# Patient Record
Sex: Female | Born: 1955 | Race: White | Hispanic: No | Smoking: Current some day smoker
Health system: Southern US, Community
[De-identification: ages and names within clinical notes are randomized; demographics above are authoritative.]

## PROBLEM LIST (undated history)

## (undated) DIAGNOSIS — M069 Rheumatoid arthritis, unspecified: Secondary | ICD-10-CM

## (undated) DIAGNOSIS — N83209 Unspecified ovarian cyst, unspecified side: Secondary | ICD-10-CM

## (undated) DIAGNOSIS — F32A Depression, unspecified: Secondary | ICD-10-CM

## (undated) DIAGNOSIS — R51 Headache: Secondary | ICD-10-CM

## (undated) DIAGNOSIS — R519 Headache, unspecified: Secondary | ICD-10-CM

## (undated) DIAGNOSIS — T8859XA Other complications of anesthesia, initial encounter: Secondary | ICD-10-CM

## (undated) DIAGNOSIS — E785 Hyperlipidemia, unspecified: Secondary | ICD-10-CM

## (undated) DIAGNOSIS — J189 Pneumonia, unspecified organism: Secondary | ICD-10-CM

## (undated) DIAGNOSIS — Z87442 Personal history of urinary calculi: Secondary | ICD-10-CM

## (undated) DIAGNOSIS — F419 Anxiety disorder, unspecified: Secondary | ICD-10-CM

## (undated) DIAGNOSIS — F329 Major depressive disorder, single episode, unspecified: Secondary | ICD-10-CM

## (undated) DIAGNOSIS — I10 Essential (primary) hypertension: Secondary | ICD-10-CM

## (undated) HISTORY — DX: Depression, unspecified: F32.A

## (undated) HISTORY — DX: Unspecified ovarian cyst, unspecified side: N83.209

## (undated) HISTORY — DX: Rheumatoid arthritis, unspecified: M06.9

## (undated) HISTORY — PX: KNEE ARTHROSCOPY: SUR90

## (undated) HISTORY — DX: Headache: R51

## (undated) HISTORY — DX: Headache, unspecified: R51.9

## (undated) HISTORY — DX: Anxiety disorder, unspecified: F41.9

## (undated) HISTORY — PX: BREAST EXCISIONAL BIOPSY: SUR124

## (undated) HISTORY — PX: OVARIAN CYST REMOVAL: SHX89

## (undated) HISTORY — PX: COLONOSCOPY W/ POLYPECTOMY: SHX1380

## (undated) HISTORY — PX: JOINT REPLACEMENT: SHX530

## (undated) HISTORY — DX: Essential (primary) hypertension: I10

## (undated) HISTORY — DX: Hyperlipidemia, unspecified: E78.5

## (undated) HISTORY — DX: Major depressive disorder, single episode, unspecified: F32.9

---

## 1982-06-23 HISTORY — PX: TONSILECTOMY/ADENOIDECTOMY WITH MYRINGOTOMY: SHX6125

## 1991-06-24 HISTORY — PX: ABDOMINAL HYSTERECTOMY: SHX81

## 1999-06-04 ENCOUNTER — Encounter: Admission: RE | Admit: 1999-06-04 | Discharge: 1999-06-04 | Payer: Self-pay | Admitting: Gastroenterology

## 1999-06-04 ENCOUNTER — Encounter: Payer: Self-pay | Admitting: Gastroenterology

## 1999-06-12 ENCOUNTER — Ambulatory Visit (HOSPITAL_COMMUNITY): Admission: RE | Admit: 1999-06-12 | Discharge: 1999-06-12 | Payer: Self-pay | Admitting: Gastroenterology

## 1999-06-12 ENCOUNTER — Encounter: Payer: Self-pay | Admitting: Gastroenterology

## 1999-06-24 HISTORY — PX: OTHER SURGICAL HISTORY: SHX169

## 2004-05-28 ENCOUNTER — Ambulatory Visit: Payer: Self-pay | Admitting: Internal Medicine

## 2004-09-02 ENCOUNTER — Ambulatory Visit: Payer: Self-pay | Admitting: Internal Medicine

## 2006-01-28 ENCOUNTER — Ambulatory Visit: Payer: Self-pay | Admitting: Internal Medicine

## 2006-06-18 ENCOUNTER — Ambulatory Visit: Payer: Self-pay | Admitting: Urology

## 2006-06-19 ENCOUNTER — Ambulatory Visit: Payer: Self-pay | Admitting: Urology

## 2006-07-09 ENCOUNTER — Ambulatory Visit: Payer: Self-pay | Admitting: Urology

## 2006-07-29 ENCOUNTER — Ambulatory Visit: Payer: Self-pay | Admitting: Urology

## 2008-01-17 ENCOUNTER — Ambulatory Visit: Payer: Self-pay | Admitting: Urology

## 2008-01-26 ENCOUNTER — Ambulatory Visit: Payer: Self-pay | Admitting: Urology

## 2008-02-03 ENCOUNTER — Ambulatory Visit: Payer: Self-pay | Admitting: Urology

## 2008-02-14 ENCOUNTER — Ambulatory Visit: Payer: Self-pay | Admitting: Urology

## 2008-04-10 ENCOUNTER — Ambulatory Visit: Payer: Self-pay | Admitting: Gastroenterology

## 2008-04-27 IMAGING — CT CT ABD-PELV W/O CM
1 of 2 series · 15 of 32 positions shown, 19 images · non-contrast
Comparison: none

REASON FOR EXAM: Renal colic    hematuria    nephroithasis
COMMENTS:

[Series 2: soft tissue · axial · 0.68mm/px · z∈[-236,+160]mm · 15 of 144 slices shown, 19 images]
[im 6/144  soft-tissue]
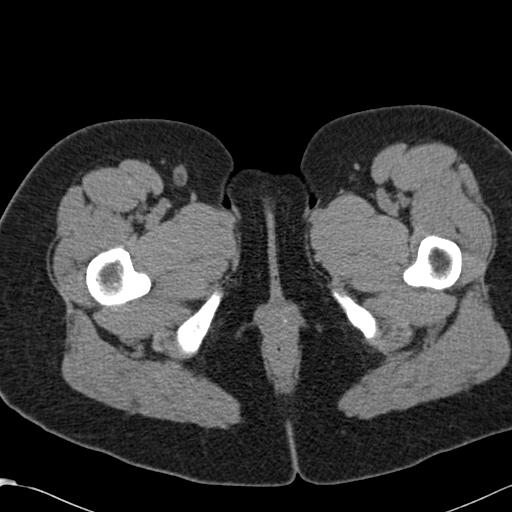
[im 6/144  bone]
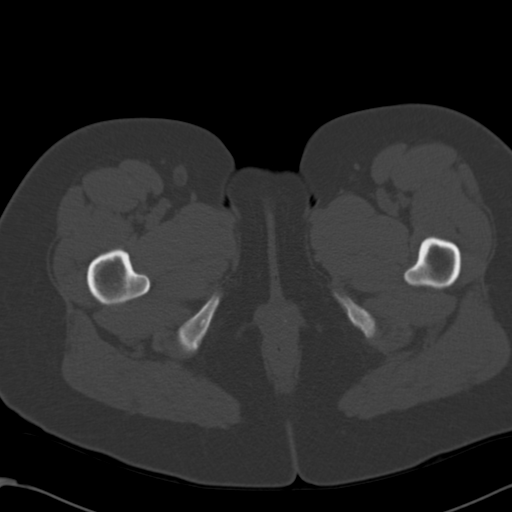
[im 17/144  soft-tissue]
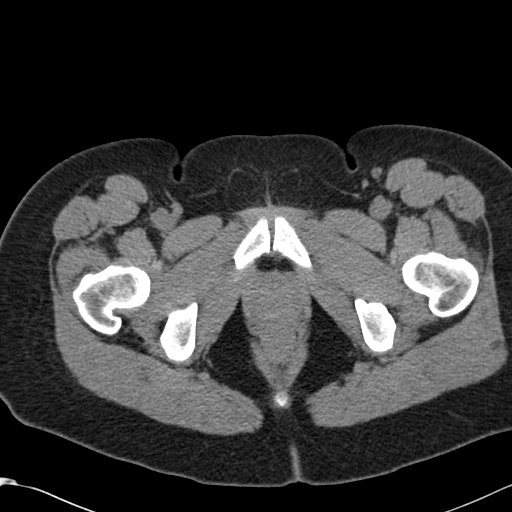
[im 28/144  soft-tissue]
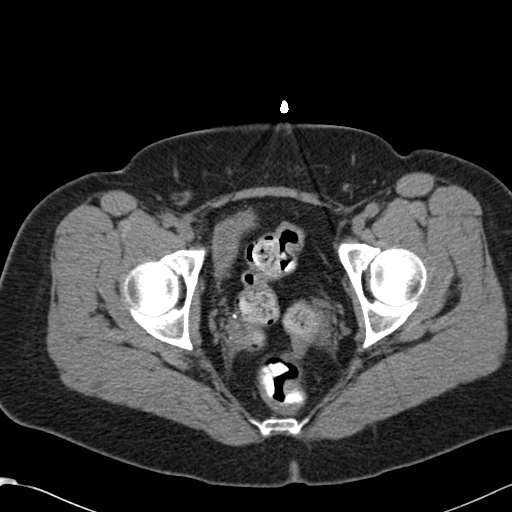
[im 39/144  soft-tissue]
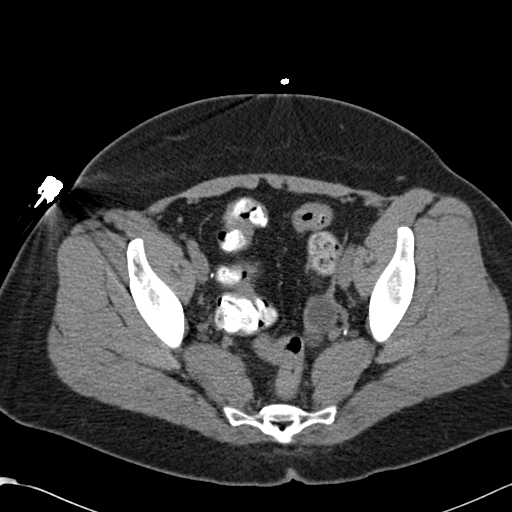
[im 50/144  soft-tissue]
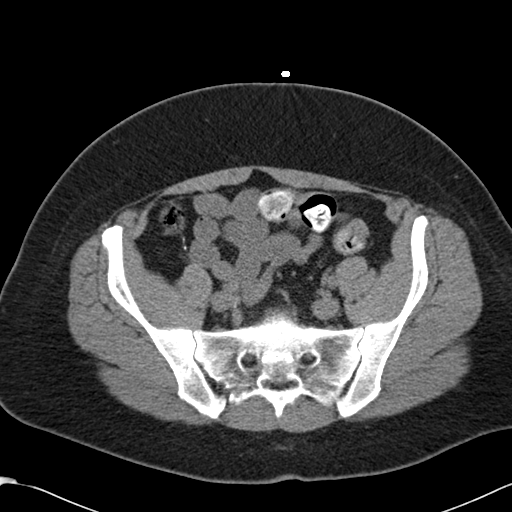
[im 61/144  soft-tissue]
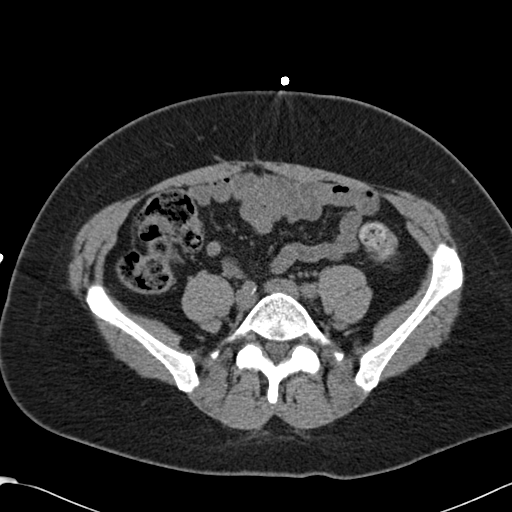
[im 72/144  soft-tissue]
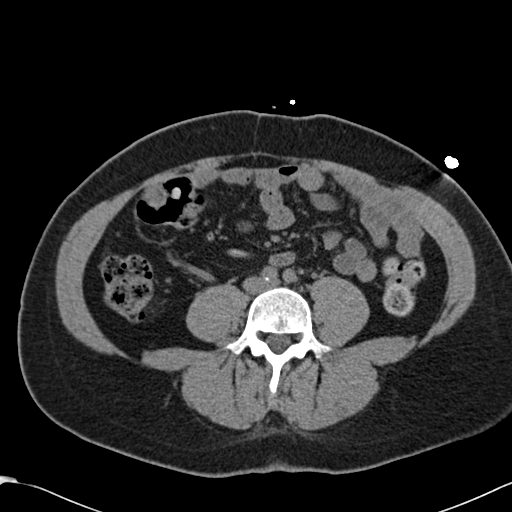
[im 83/144  soft-tissue]
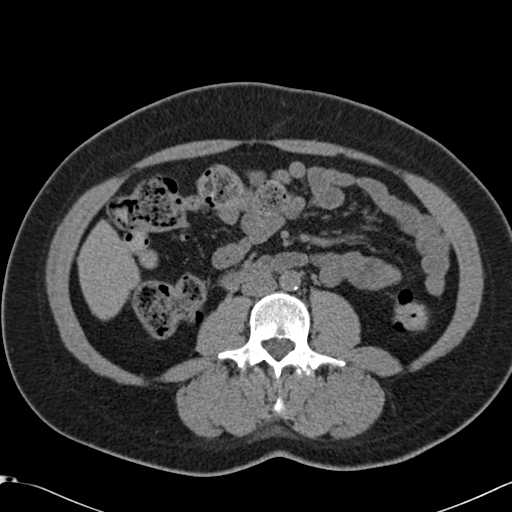
[im 94/144  soft-tissue]
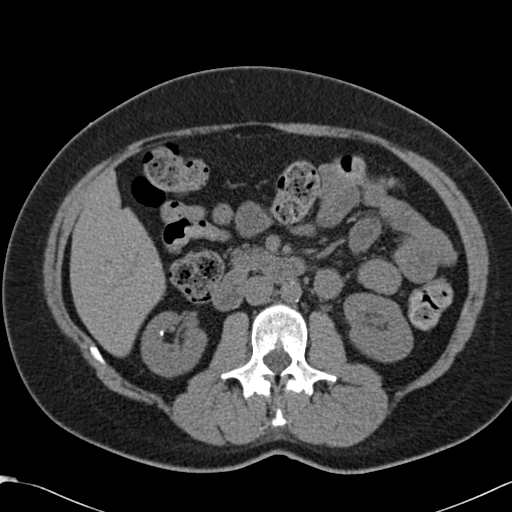
[im 94/144  bone]
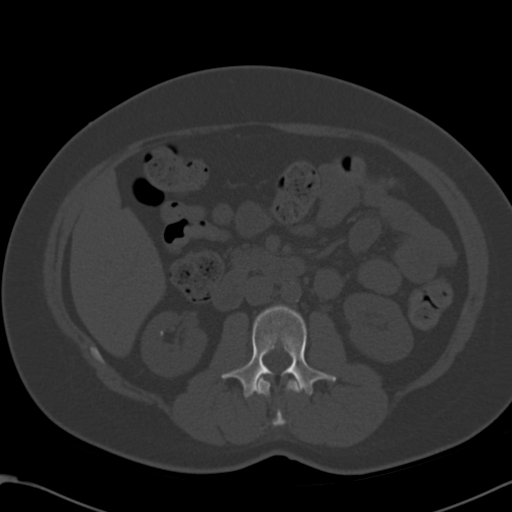
[im 105/144  soft-tissue]
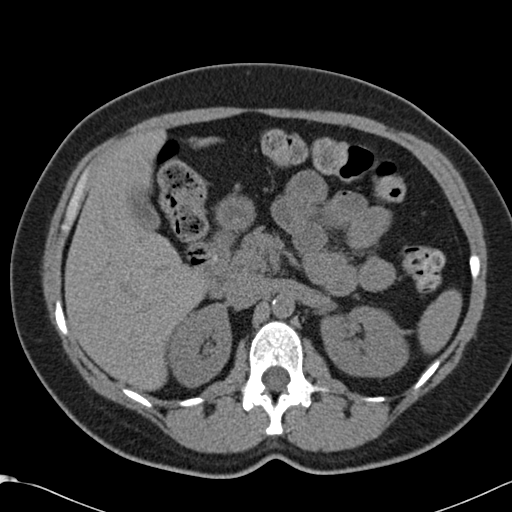
[im 116/144  soft-tissue]
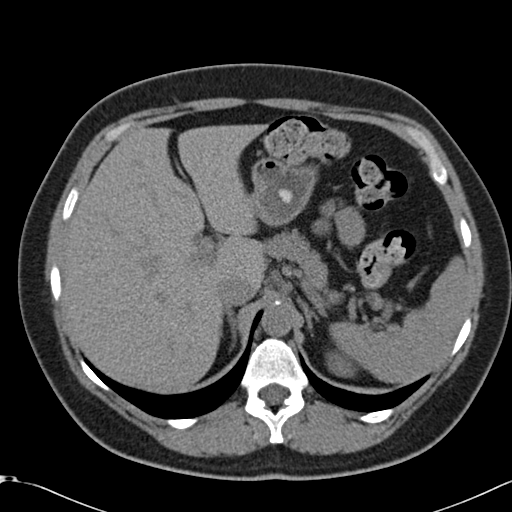
[im 122/144  lung]
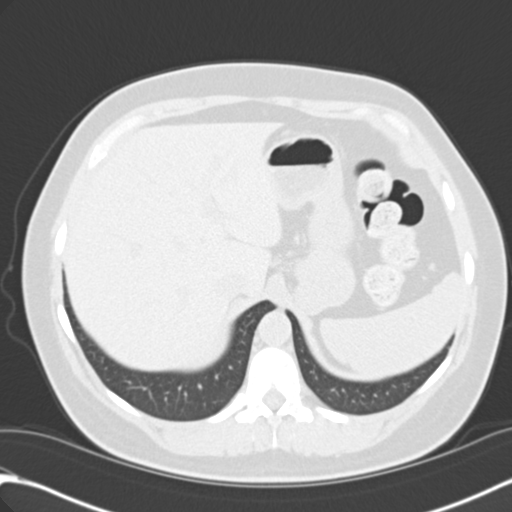
[im 127/144  soft-tissue]
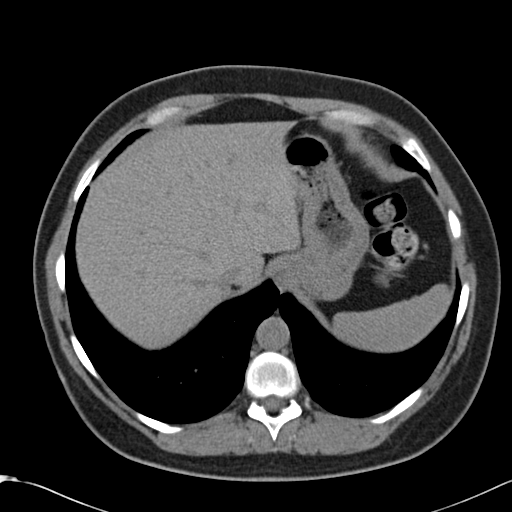
[im 127/144  lung]
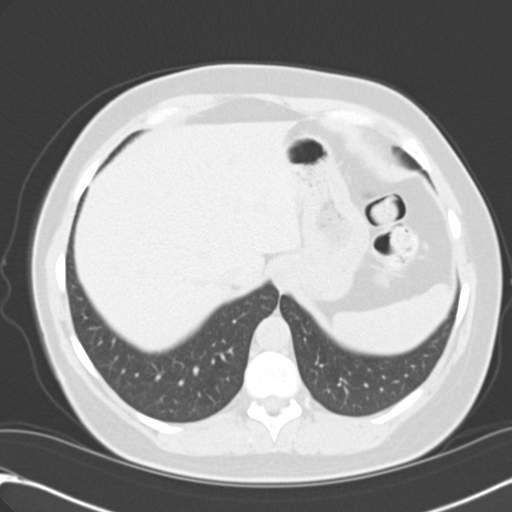
[im 133/144  lung]
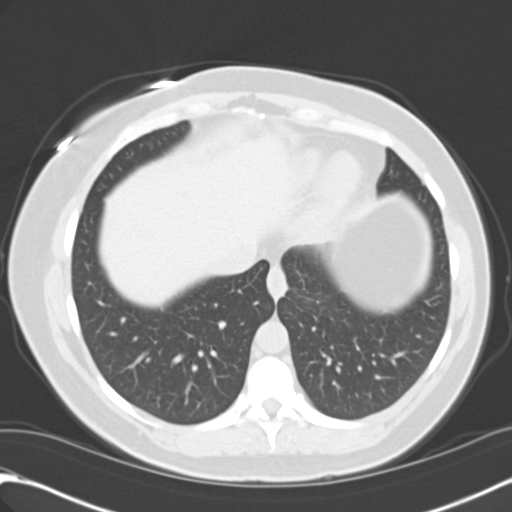
[im 138/144  soft-tissue]
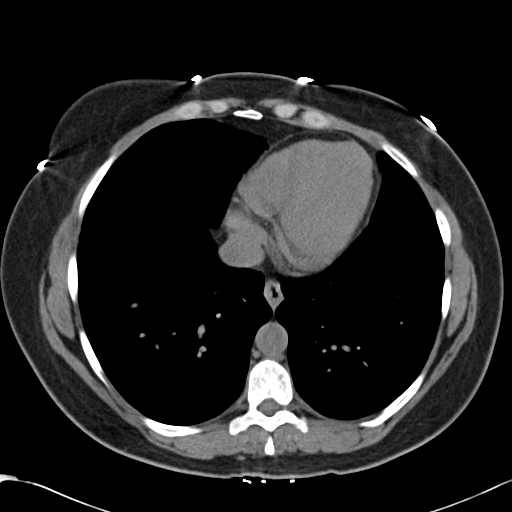
[im 138/144  lung]
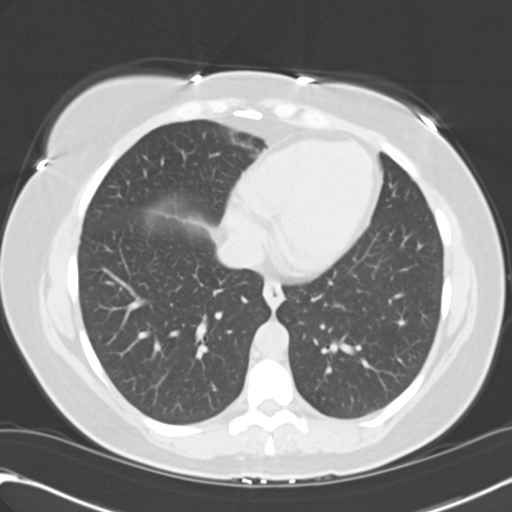

[15 of 32 positions shown; findings below may reference images not displayed]

PROCEDURE:     CT  - CT ABDOMEN AND PELVIS W[DATE]  [DATE]

RESULT:       Noncontrast CT scan of the abdomen and pelvis is performed.
The study is obtained utilizing a multislice helical acquisition and a renal
stone protocol.  There is a tiny calyceal stone on image 52 in the lower
pole of the RIGHT kidney.  This measures approximately 3.0-3.5 mm.  A second
area of density is seen in the LEFT renal pelvis region and measures up to
approximately 6.7 mm.  No additional stones are evident.  No renal cortical
defect or mass is seen.  No radiopaque gallstones are evident.  There appear
to be some radiopaque tablets in the patient's stomach.  There is no
abnormal bowel distention.  The pancreas, spleen and adrenal glands are
unremarkable. Atherosclerotic calcification is seen in a nonaneurysmal
aorta.  No inflammatory changes or areas of adenopathy are seen.  The
patient appears to be status post hysterectomy.
IMPRESSION: Bilateral renal stones.  The smaller appears to be in the RIGHT kidney lower
pole calyceal region and is nonobstructing.  A second is larger and seems to
be in the area of the LEFT renal pelvis.  No definite hydronephrosis is
seen.  Continued follow up is suggested.

## 2009-12-04 IMAGING — CT CT ABD-PELV W/O CM
1 of 2 series · 15 of 32 positions shown, 19 images · non-contrast
Comparison: none

REASON FOR EXAM: nephrolithiasis renal colic
COMMENTS:

PROCEDURE:     CT  - CT ABDOMEN AND PELVIS W[DATE]  [DATE]
RESULT:     Indication: Renal stone.
TECHNIQUE: A standard renal stone protocol was performed with sequential 5
mm noncontrasted axial images from the lung bases to the symphysis pubis
with the patient in a supine position.

[Series 2: stone · axial · 0.65mm/px · z∈[-738,-354]mm · 15 of 140 slices shown, 19 images]
[im 6/140  soft-tissue]
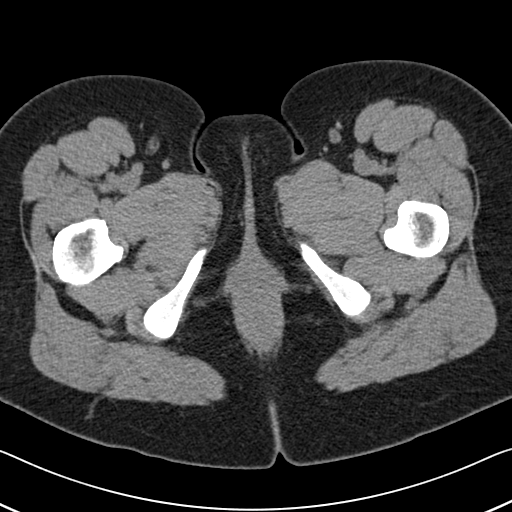
[im 6/140  bone]
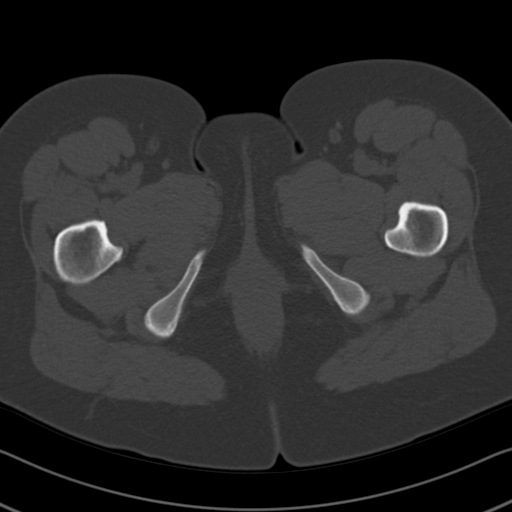
[im 17/140  soft-tissue]
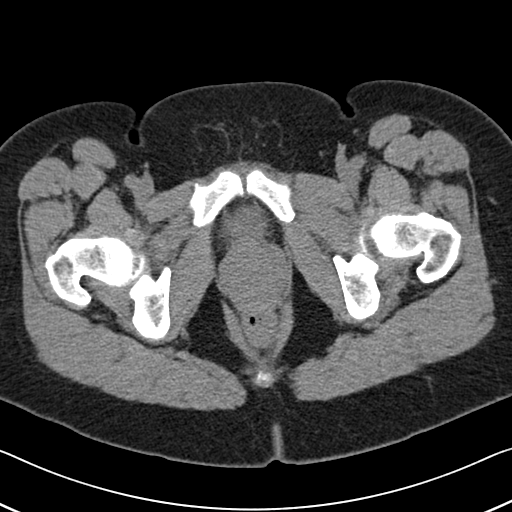
[im 27/140  soft-tissue]
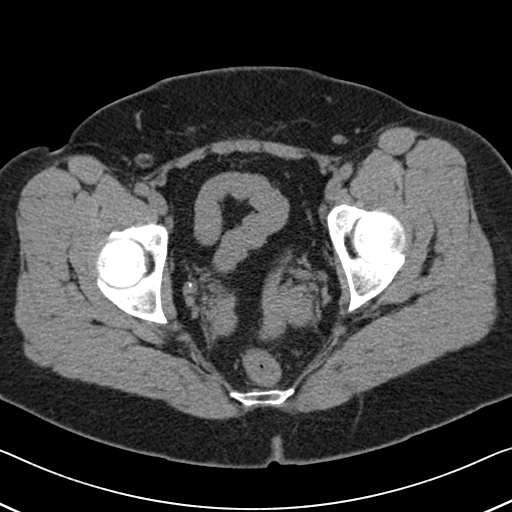
[im 38/140  soft-tissue]
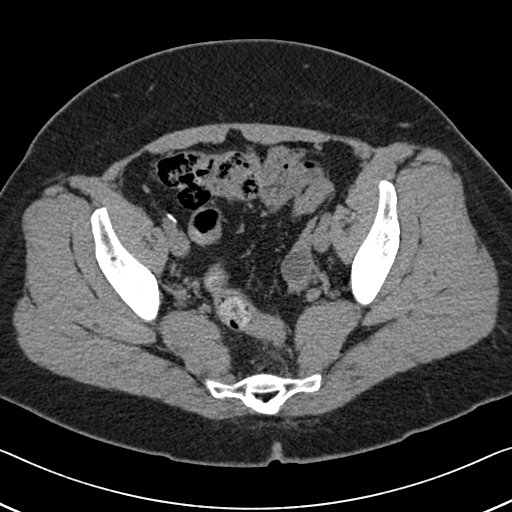
[im 49/140  soft-tissue]
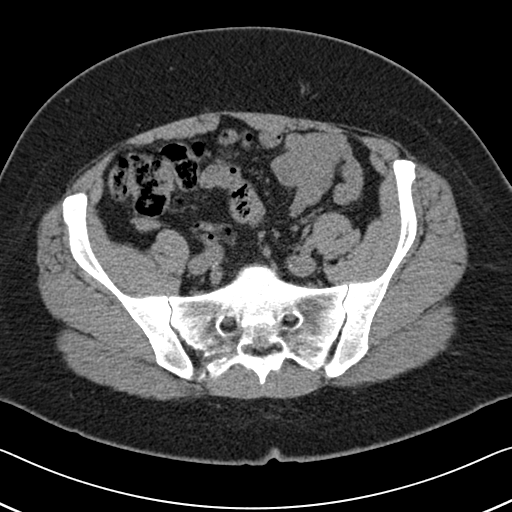
[im 59/140  soft-tissue]
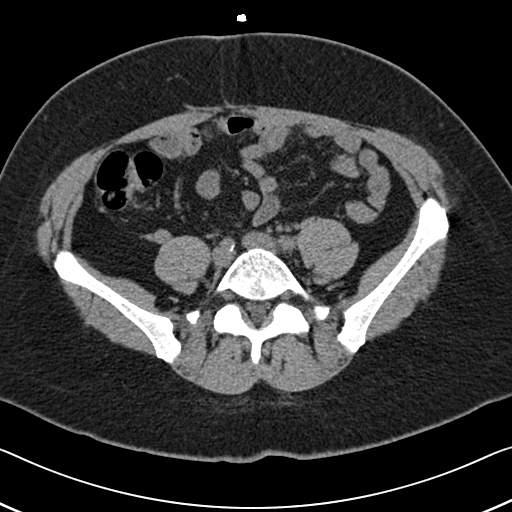
[im 70/140  soft-tissue]
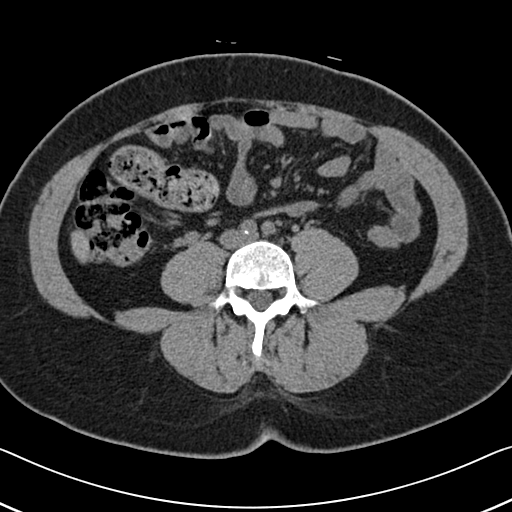
[im 81/140  soft-tissue]
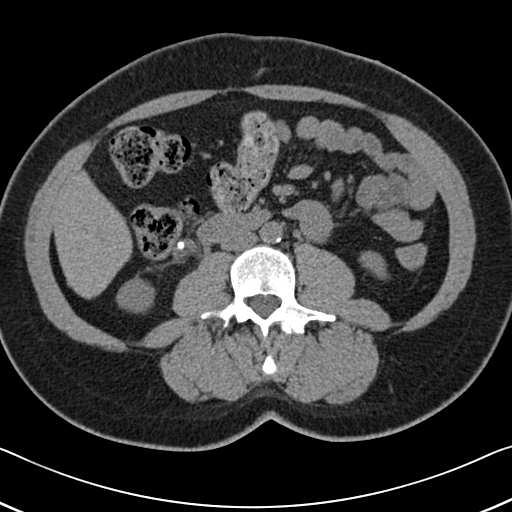
[im 91/140  soft-tissue]
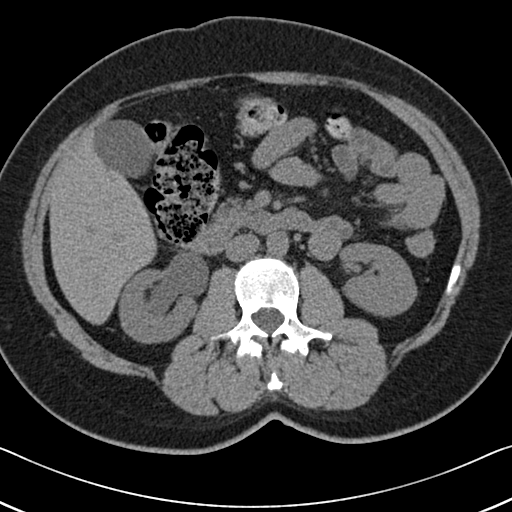
[im 91/140  bone]
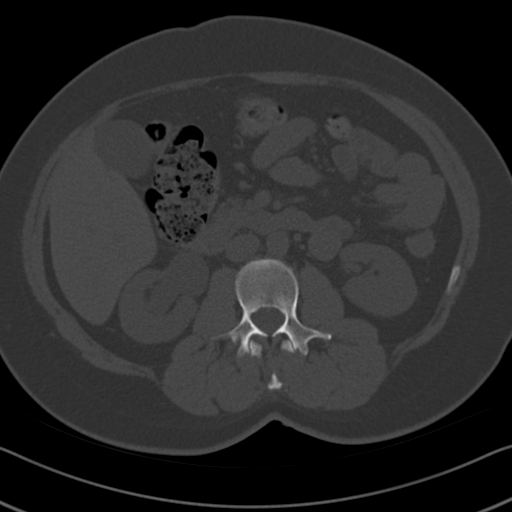
[im 102/140  soft-tissue]
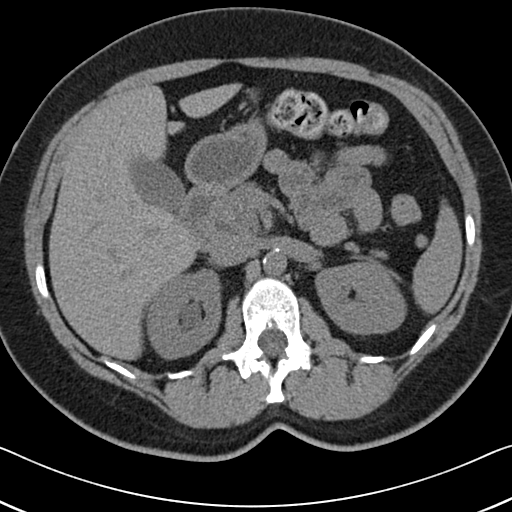
[im 113/140  soft-tissue]
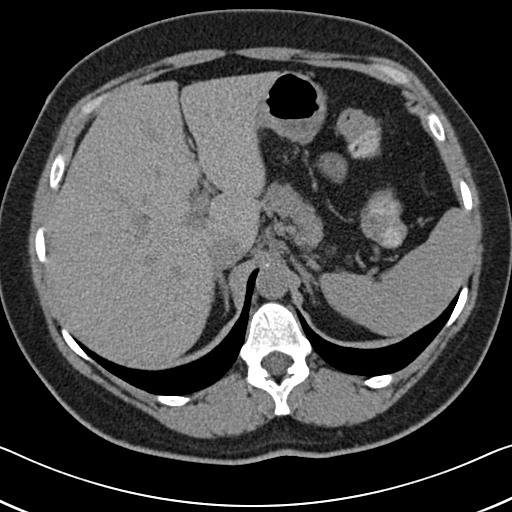
[im 118/140  lung]
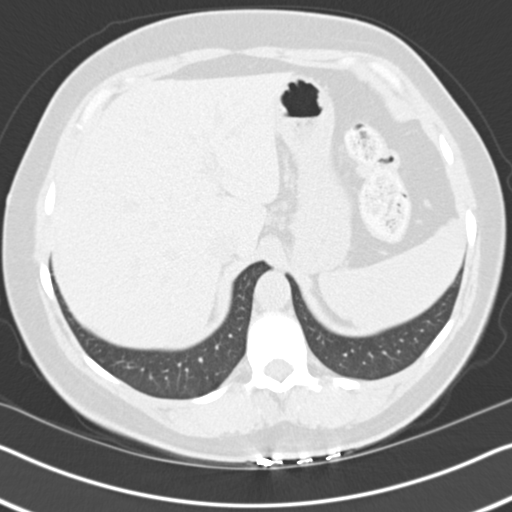
[im 123/140  soft-tissue]
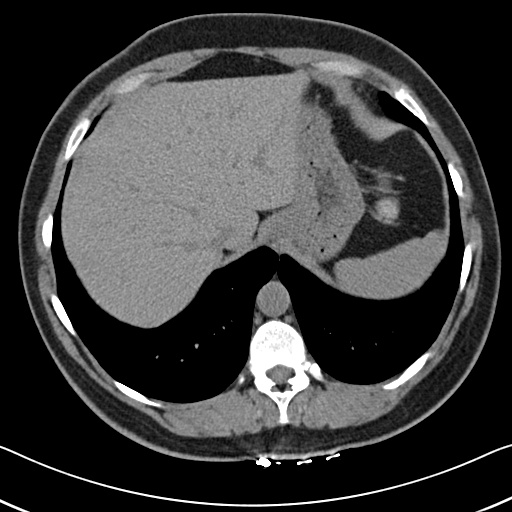
[im 123/140  lung]
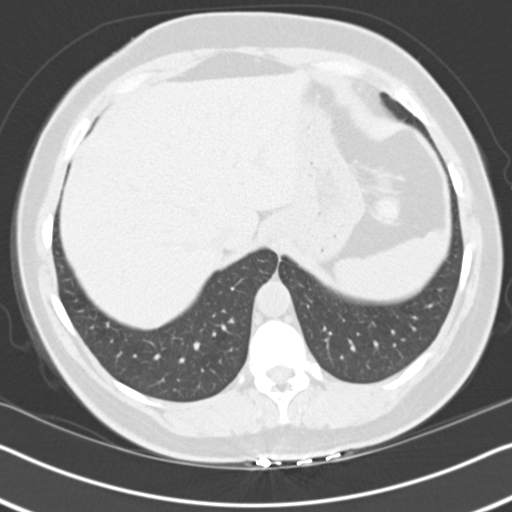
[im 129/140  lung]
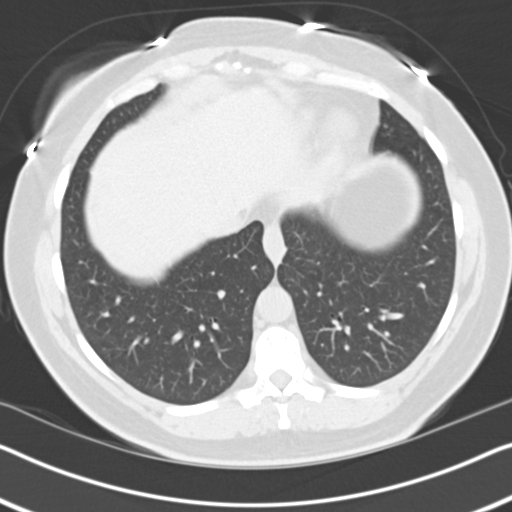
[im 134/140  soft-tissue]
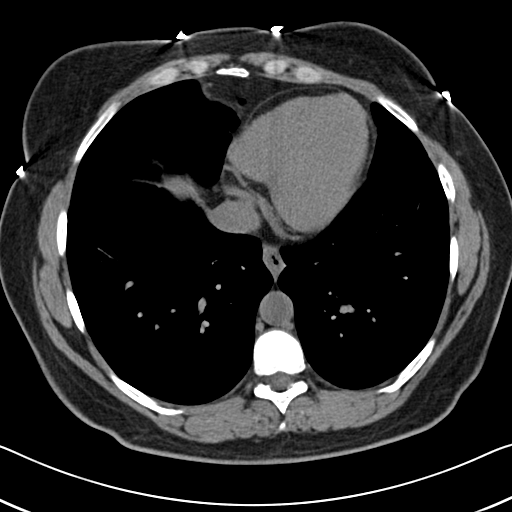
[im 134/140  lung]
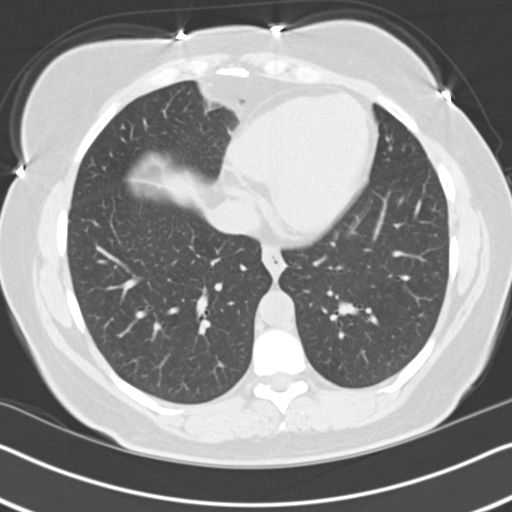

[15 of 32 positions shown; findings below may reference images not displayed]

FINDINGS: Limited views of the lung bases fail to demonstrate abnormal parenchymal
nodules or pleural or pericardial effusions.

There is a 10 mm proximal right ureteral calculus with moderate
hydronephrosis. There is no perinephric stranding or perinephric fluid
collection. There no other renal, ureteral, or bladder calculi.

The liver demonstrates no focal abnormality. The gallbladder is
unremarkable. The spleen demonstrates no focal abnormality. The adrenal
glands and pancreas are normal.

The unopacified stomach, duodenum, small intestine, and large intestine are
unremarkable, but evaluation is limited by lack of oral contrast. There is
no pneumoperitoneum, pneumatosis, or portal venous gas. There is no
abdominal or pelvic free fluid. There is no lymphadenopathy.

The aorta is normal in caliber. There is an abdominal aortic atherosclerotic
calcification.

The osseous structures are unremarkable.
IMPRESSION: 1. There is a 10 mm proximal right ureteral calculus with moderate right
hydronephrosis.

## 2012-03-26 ENCOUNTER — Ambulatory Visit: Payer: Self-pay | Admitting: Internal Medicine

## 2012-09-21 ENCOUNTER — Ambulatory Visit: Payer: Self-pay | Admitting: Internal Medicine

## 2013-01-07 ENCOUNTER — Ambulatory Visit: Payer: Self-pay | Admitting: Internal Medicine

## 2013-12-28 ENCOUNTER — Emergency Department: Payer: Self-pay | Admitting: Internal Medicine

## 2014-01-24 ENCOUNTER — Emergency Department: Payer: Self-pay | Admitting: Emergency Medicine

## 2014-01-24 LAB — CBC WITH DIFFERENTIAL/PLATELET
Basophil #: 0 10*3/uL (ref 0.0–0.1)
Basophil %: 0.3 %
Eosinophil #: 0.1 10*3/uL (ref 0.0–0.7)
Eosinophil %: 1.1 %
HCT: 40 % (ref 35.0–47.0)
HGB: 13.1 g/dL (ref 12.0–16.0)
Lymphocyte #: 1.3 10*3/uL (ref 1.0–3.6)
Lymphocyte %: 26 %
MCH: 28.4 pg (ref 26.0–34.0)
MCHC: 32.7 g/dL (ref 32.0–36.0)
MCV: 87 fL (ref 80–100)
Monocyte #: 0.4 x10 3/mm (ref 0.2–0.9)
Monocyte %: 7.1 %
Neutrophil #: 3.3 10*3/uL (ref 1.4–6.5)
Neutrophil %: 65.5 %
Platelet: 272 10*3/uL (ref 150–440)
RBC: 4.6 10*6/uL (ref 3.80–5.20)
RDW: 13 % (ref 11.5–14.5)
WBC: 5.1 10*3/uL (ref 3.6–11.0)

## 2014-01-24 LAB — URINALYSIS, COMPLETE
Bacteria: NONE SEEN
Bilirubin,UR: NEGATIVE
Blood: NEGATIVE
Glucose,UR: NEGATIVE mg/dL (ref 0–75)
Ketone: NEGATIVE
Leukocyte Esterase: NEGATIVE
Nitrite: NEGATIVE
Ph: 5 (ref 4.5–8.0)
Protein: NEGATIVE
RBC,UR: NONE SEEN /HPF (ref 0–5)
Specific Gravity: 1.014 (ref 1.003–1.030)
Squamous Epithelial: 3
WBC UR: NONE SEEN /HPF (ref 0–5)

## 2014-01-24 LAB — BASIC METABOLIC PANEL
Anion Gap: 8 (ref 7–16)
BUN: 13 mg/dL (ref 7–18)
Calcium, Total: 8.9 mg/dL (ref 8.5–10.1)
Chloride: 104 mmol/L (ref 98–107)
Co2: 26 mmol/L (ref 21–32)
Creatinine: 1.05 mg/dL (ref 0.60–1.30)
EGFR (African American): >60
EGFR (Non-African Amer.): 59 — ABNORMAL LOW
Glucose: 96 mg/dL (ref 65–99)
Osmolality: 276 (ref 275–301)
Potassium: 4 mmol/L (ref 3.5–5.1)
Sodium: 138 mmol/L (ref 136–145)

## 2014-01-24 LAB — HEPATIC FUNCTION PANEL A (ARMC)
Albumin: 4.2 g/dL (ref 3.4–5.0)
Alkaline Phosphatase: 74 U/L
Bilirubin, Direct: <0.1 mg/dL (ref 0.00–0.20)
Bilirubin,Total: 0.5 mg/dL (ref 0.2–1.0)
SGOT(AST): 22 U/L (ref 15–37)
SGPT (ALT): 30 U/L
Total Protein: 7.9 g/dL (ref 6.4–8.2)

## 2014-01-24 LAB — LIPASE, BLOOD: Lipase: 129 U/L (ref 73–393)

## 2014-03-14 DIAGNOSIS — G894 Chronic pain syndrome: Secondary | ICD-10-CM | POA: Insufficient documentation

## 2014-03-17 ENCOUNTER — Ambulatory Visit: Payer: Self-pay | Admitting: Internal Medicine

## 2014-03-23 ENCOUNTER — Ambulatory Visit: Payer: Self-pay | Admitting: Internal Medicine

## 2014-04-28 ENCOUNTER — Ambulatory Visit: Payer: Self-pay | Admitting: Internal Medicine

## 2014-05-23 ENCOUNTER — Ambulatory Visit: Payer: Self-pay | Admitting: Internal Medicine

## 2015-05-16 ENCOUNTER — Other Ambulatory Visit: Payer: Self-pay | Admitting: Internal Medicine

## 2015-05-16 DIAGNOSIS — Z1231 Encounter for screening mammogram for malignant neoplasm of breast: Secondary | ICD-10-CM

## 2015-05-29 ENCOUNTER — Ambulatory Visit: Payer: Self-pay | Attending: Internal Medicine

## 2016-09-15 ENCOUNTER — Other Ambulatory Visit: Payer: Self-pay | Admitting: Internal Medicine

## 2016-09-15 DIAGNOSIS — Z1231 Encounter for screening mammogram for malignant neoplasm of breast: Secondary | ICD-10-CM

## 2016-10-17 ENCOUNTER — Ambulatory Visit
Admission: RE | Admit: 2016-10-17 | Discharge: 2016-10-17 | Disposition: A | Discharge status: Home / Self Care | Payer: 59 | Source: Ambulatory Visit | Attending: Internal Medicine | Admitting: Internal Medicine

## 2016-10-17 ENCOUNTER — Encounter: Payer: Self-pay | Admitting: Radiology

## 2016-10-17 DIAGNOSIS — Z1231 Encounter for screening mammogram for malignant neoplasm of breast: Secondary | ICD-10-CM | POA: Diagnosis not present

## 2017-09-24 ENCOUNTER — Other Ambulatory Visit: Payer: Self-pay | Admitting: Internal Medicine

## 2017-09-24 DIAGNOSIS — Z1231 Encounter for screening mammogram for malignant neoplasm of breast: Secondary | ICD-10-CM

## 2017-10-14 ENCOUNTER — Ambulatory Visit
Admission: RE | Admit: 2017-10-14 | Discharge: 2017-10-14 | Disposition: A | Discharge status: Home / Self Care | Payer: Managed Care, Other (non HMO) | Source: Ambulatory Visit | Attending: Internal Medicine | Admitting: Internal Medicine

## 2017-10-14 DIAGNOSIS — Z1231 Encounter for screening mammogram for malignant neoplasm of breast: Secondary | ICD-10-CM | POA: Insufficient documentation

## 2017-10-30 ENCOUNTER — Ambulatory Visit
Admission: RE | Admit: 2017-10-30 | Discharge: 2017-10-30 | Disposition: A | Discharge status: Home / Self Care | Payer: Managed Care, Other (non HMO) | Source: Ambulatory Visit | Attending: Internal Medicine | Admitting: Internal Medicine

## 2017-10-30 DIAGNOSIS — Z1231 Encounter for screening mammogram for malignant neoplasm of breast: Secondary | ICD-10-CM | POA: Diagnosis not present

## 2018-07-26 ENCOUNTER — Other Ambulatory Visit: Payer: Self-pay | Admitting: Neurology

## 2018-07-26 DIAGNOSIS — M5441 Lumbago with sciatica, right side: Secondary | ICD-10-CM

## 2018-08-05 ENCOUNTER — Ambulatory Visit: Payer: Managed Care, Other (non HMO)

## 2018-08-06 ENCOUNTER — Ambulatory Visit: Payer: Managed Care, Other (non HMO) | Attending: Neurology

## 2018-08-06 ENCOUNTER — Other Ambulatory Visit: Payer: Self-pay

## 2018-08-06 DIAGNOSIS — M6281 Muscle weakness (generalized): Secondary | ICD-10-CM | POA: Diagnosis present

## 2018-08-06 DIAGNOSIS — F32A Depression, unspecified: Secondary | ICD-10-CM | POA: Insufficient documentation

## 2018-08-06 DIAGNOSIS — G8929 Other chronic pain: Secondary | ICD-10-CM | POA: Insufficient documentation

## 2018-08-06 DIAGNOSIS — R293 Abnormal posture: Secondary | ICD-10-CM | POA: Diagnosis present

## 2018-08-06 DIAGNOSIS — M0579 Rheumatoid arthritis with rheumatoid factor of multiple sites without organ or systems involvement: Secondary | ICD-10-CM | POA: Insufficient documentation

## 2018-08-06 DIAGNOSIS — F329 Major depressive disorder, single episode, unspecified: Secondary | ICD-10-CM | POA: Insufficient documentation

## 2018-08-06 DIAGNOSIS — I10 Essential (primary) hypertension: Secondary | ICD-10-CM | POA: Insufficient documentation

## 2018-08-06 DIAGNOSIS — M5442 Lumbago with sciatica, left side: Secondary | ICD-10-CM | POA: Diagnosis not present

## 2018-08-06 DIAGNOSIS — F419 Anxiety disorder, unspecified: Secondary | ICD-10-CM | POA: Insufficient documentation

## 2018-08-06 DIAGNOSIS — M069 Rheumatoid arthritis, unspecified: Secondary | ICD-10-CM | POA: Insufficient documentation

## 2018-08-06 DIAGNOSIS — M5441 Lumbago with sciatica, right side: Secondary | ICD-10-CM | POA: Insufficient documentation

## 2018-08-06 NOTE — Patient Instructions (Signed)
Access Code: [email or web address]  URL: [email or web address]  Date: 08/06/2018  Prepared by: Precious Bard   Exercises  Hooklying Lumbar Rotation - 10 reps - 2 sets - 5 hold - 1x daily - 7x weekly  Supine Sciatic Nerve Mobilization With Leg on Pillow - 10 reps - 2 sets - 5 hold - 1x daily - 7x weekly  Seated Sciatic Tensioner - 10 reps - 2 sets - 5 hold - 1x daily - 7x weekly

## 2018-08-06 NOTE — Therapy (Addendum)
[facility] MAIN [facility] SERVICES [address] [place], [place], [address] Phone: [phone]   Fax:  [phone]  Physical Therapy Evaluation  Patient Details  Name: Natasha Gonzales MRN: [id number] Date of Birth: December 30, 1955 Referring Provider (PT): Dr. Cristopher Peru   Encounter Date: 08/06/2018  PT End of Session - 08/06/18 1040    Visit Number  1    Number of Visits  8    Date for PT Re-Evaluation  10/01/18    Authorization Time Period  1/10 start 08/06/2018    PT Start Time  0902    PT Stop Time  0959    PT Time Calculation (min)  57 min    Equipment Utilized During Treatment  --   pelvic brace   Activity Tolerance  Patient limited by pain    Behavior During Therapy  [facility] for tasks assessed/performed       Past Medical History:  Diagnosis Date  . Anxiety   . Depression   . Generalized headaches   . Hypertension   . Ovarian cyst   . Rheumatoid arthritis [facility])     Past Surgical History:  Procedure Laterality Date  . ABDOMINAL HYSTERECTOMY  1993   total  . adhesion  2001   laprascopic  . TONSILECTOMY/ADENOIDECTOMY WITH MYRINGOTOMY  1984    There were no vitals filed for this visit.   Subjective Assessment - 08/06/18 0917    Subjective  Patient reports that she has had a rough week pain-wise this week, and states that her pain is currently 4/10 although it usually hangs around 2/10. She notes that although she feels that she can function, her pain greatly limits her in terms of her ability to perform activities including work tasks, exercising, and other activities that she would like to be able to do.    Pertinent History  Patient presents to PT with chronic low back pain that began about 18 years ago with sciatica (L worse Gonzales R). Her PMH also includes rheumatoid arthritis, hypertension, depression, and anxiety. She states that she had also been previously diagnosed with an L4-L5 neuropathy. She rates her current pain at 4/10,  although her pain typically hangs around a 2/10 when under medication. However, her pain gets up to 8/10 during periods in which she is not taking medication. She does state that rarely her pain is less Gonzales 2/10, but that she is never "truly without pain". She describes her pain as burning, stabbing, and aching, and reports intermittent shooting pains down her legs bilaterally with occasional numbness/tingling down to her ankles (denies in her feet). She feels that her current medications, heat, stretching, and TENS improve her pain, while prolonged standing, sitting, walking, and lifting increase her pain. She currently performs daily flexibility and stretching exercises which helps her, but she is hoping to expand her physical activity and be able to walk longer distances and perform more rigorous physical activity without pain (such as vacuuming and cleaning her home). 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 She had one previous bout of PT when her pain first began around 18 years ago following a procedure to remove surgical adhesions in her back/abdomen, which she states helped her pain and functional ability greatly. She also used to see specialists for pelvic pain, but she is not currently.    Limitations  Sitting;Lifting;Standing;Walking;House hold activities;Other (comment)   exercise  How long can you sit comfortably?  30 minutes    How long can you stand comfortably?  20 minutes    How long can you walk comfortably?  15-20 minutes (about 2500 steps)    Diagnostic tests  Lumbar MRI coming up    Patient Stated Goals  to decrease pain and increase function to be able to clean her house (including vacuuming), walking longer distances, exercise    Currently in Pain?  Yes    Pain Score  4     Pain Location  Back    Pain Orientation  Lower;Mid    Pain Descriptors / Indicators  Burning;Sharp;Shooting    Pain Type   Chronic pain    Pain Radiating Towards  down BLE to ankles (not feet)    Pain Onset  Other (comment)   approximately 18 years ago   Pain Frequency  Constant    Aggravating Factors   traveling, prolonged standing/sitting/walking, lifting things    Pain Relieving Factors  stretching/flexibility exercises, heat, TENS, current medications, acupuncture    Effect of Pain on Daily Activities  difficulty performing work tasks, inability to walk long distances, difficulty performing tasks around the home       Objective:  PAIN: (when taking medication) Current: 4/10 Usual: 2/10 Worst: 8/10 (no medication) Best: Less Gonzales 2/10, but never 0  POSTURE/ Observation: Minimal forward head rounded shoulders  Seated: frequent weight shift to left and right, increased kyphosis Standing: frequent weight shift left and right (not forward and backwards), slight increase in lordosis, noted increase in kyphosis. Limited ability to maintain static stand for >10 seconds   PROM/AROM:   Standing:  . Spinal AROM  o Flexion - WFL; approximately 2 inches from touching feet bilaterally; patient reports that it "feels good" - Thoracic as main means of movement, slight flattening of lumbar spine o Extension - Mildly limited (~15 degrees)  and painful in superior lower back (patient reports sharp pain medially) - Hinging noted at L4-5 o Lateral flexion - Limited bilaterally (R (10 degrees) > L (5 degrees)  today); concordant shooting pain down leg on R side during performance o Rotation - WFL bilaterally; mild sharp pain at end range with L rotation  . Hip screen o FABER - Negative RLE - Minimal concordant pain in L hip at approximately 90 degrees hip flexion (difficult to fully assess due to pain with knee to chest with LLE) o FADIR - Negative bilaterally o SCOUR - Deferred bilaterally due to pain . Hamstring Length o WFL LLE o Limited to approximately 25 degrees RLE  STRENGTH:  Graded on a 0-5  scale o Hip flexion - L: 3+/5, painful lifting off table - R: 4/5, painless o Hip adduction - L: 4/5, painless - R: 4/5, painless o Hip abduction - L: 4-/5, painless - R: 4-/5, painless o Knee flexion - L: 4-/5, painless - R: 4-/5, painless o Knee extension - L: 4/5, painless - R: 4+/5, painless o Dorsiflexion - L: 5/5, painless - R: 5/5, painless o Plantarflexion (in seated into SPT hand) - L: 4-/5, painless - R: 4+/5, painless  SENSATION: Light touch WFL and in-tact bilaterally Numbness and tingling down bilateral LE's.    SPECIAL TESTS: . SLR o Positive bilaterally (pain in right >left) . Slump Test o Positive left  . Pelvic compression  o Patient reports provides relief (in back, hips, and down into BLE) . Pelvic distraction o Patient reports increased pain (in back and hips)  FUNCTIONAL MOBILITY:   -  Patient performs sit to stands independently from standard chair height with minimal use of bilateral UE   -Patient performs supine to sit and sit to supine transfers independently; patient demonstrates partial use of core activation when performing transfers  BALANCE:  Static Sitting Balance  Normal Able to maintain balance against maximal resistance   Good Able to maintain balance against moderate resistance   Good-/Fair+ Accepts minimal resistance x  Fair Able to sit unsupported without balance loss and without UE support   Poor+ Able to maintain with Minimal assistance from individual or chair   Poor Unable to maintain balance-requires mod/max support from individual or chair    Static Standing Balance  Normal Able to maintain standing balance against maximal resistance   Good Able to maintain standing balance against moderate resistance   Good-/Fair+ Able to maintain standing balance against minimal resistance x  Fair Able to stand unsupported without UE support and without LOB for 1-2 min   Fair- Requires Min A and UE support to maintain standing without  loss of balance   Poor+ Requires mod A and UE support to maintain standing without loss of balance   Poor Requires max A and UE support to maintain standing balance without loss    Dynamic Sitting Balance  Normal Able to sit unsupported and weight shift across midline maximally   Good Able to sit unsupported and weight shift across midline moderately   Good-/Fair+ Able to sit unsupported and weight shift across midline minimally x  Fair Minimal weight shifting ipsilateral/front, difficulty crossing midline   Fair- Reach to ipsilateral side and unable to weight shift   Poor + Able to sit unsupported with min A and reach to ipsilateral side, unable to weight shift   Poor Able to sit unsupported with mod A and reach ipsilateral/front-can't cross midline    Standing Dynamic Balance  Normal Stand independently unsupported, able to weight shift and cross midline maximally   Good Stand independently unsupported, able to weight shift and cross midline moderately   Good-/Fair+ Stand independently unsupported, able to weight shift across midline minimally x  Fair Stand independently unsupported, weight shift, and reach ipsilaterally, loss of balance when crossing midline   Poor+ Able to stand with Min A and reach ipsilaterally, unable to weight shift   Poor Able to stand with Mod A and minimally reach ipsilaterally, unable to cross midline.     GAIT:  Minimally antalgic bilaterally, decreased step length bilaterally, increased lateral sway bilaterally, minimal increased forward trunk flexion throughout  Palpation/Mobilizations:  . Anterior sacral mob/compression - Positive and painful; sends shooting pain along both sides of superior-posterior hips . Grade I gross mobilizations throughout spine o Sharp, concordant pain bilateral with UPAs from levels T12-L2 . Spinal Musculature Elasticity and Tension o Paraspinals: moderately tense and tight bilaterally throughout spine o Quadratus Lumborum:  minimally tense and tight bilaterally  OUTCOME MEASURES: . Modified Oswestry o 62% disability  o Indicates extreme impairment and ability to perform tasks  This patient presents with 3 personal factors/ comorbidities, and 3  body elements including body structures and functions, activity limitations and or participation restrictions. Patient's condition is evolving.   Treatment:  . Supine sciatic nerve glide/floss 1x60 seconds BLE o Patient does not report increased pain/discomfort during performance . Demonstration and application of pelvic belt for stability o Patient reports less pain upon donning  -Education surrounding sciatica and nerve glides -Education regarding exercises for home and HEP   [facility] PT Assessment - 08/06/18 0919  Assessment   Medical Diagnosis  Low back pain with Sciatica    Referring Provider (PT)  Dr. Cristopher PeruHemang Shah    Onset Date/Surgical Date  --   Approximately 2002   Hand Dominance  Right    Next MD Visit  March 3rd     Prior Therapy  PT approximately 18 years ago      Precautions   Precautions  --   no lifting more Gonzales 15 pounds   Required Braces or Orthoses  --   joint braces for RA as needed     Restrictions   Weight Bearing Restrictions  No      Balance Screen   Has the patient fallen in the past 6 months  Yes   R ankle gave out   How many times?  1    Has the patient had a decrease in activity level because of a fear of falling?   No    Is the patient reluctant to leave their home because of a fear of falling?   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              PT Education - 08/06/18 1018    Education Details  POC, nerve glide education, use of pelvic brace, HEP    Person(s) Educated  Patient    Methods  Explanation;Demonstration;Tactile cues;Verbal cues;Handout    Comprehension  Verbalized understanding;Returned demonstration;Verbal cues required;Tactile cues required;Need further instruction       PT Short Term Goals - 08/06/18 1124      PT SHORT TERM GOAL #1   Title  Patient will be independent in home exercise program to improve strength/mobility for better functional independence with ADLs.     Baseline  2/14: HEP given    Time  2    Period  Weeks    Status  New    Target Date  08/20/18      PT SHORT TERM GOAL #2   Title  Patient will report centralization of BLE symptoms as demonstrated by decreased episodes of shooting pain/numbness/tingling into BLE.    Baseline  2/14: down to ankles bilaterally with trunk movement    Time  2    Period  Weeks    Status  New    Target Date  08/20/18        PT Long Term Goals - 08/06/18 1126      PT LONG TERM GOAL #1   Title  Patient will increase BLE gross strength to 4/5 as to improve functional strength for independent gait, increased standing tolerance  and increased ADL ability.     Baseline  2/14: limitations of L hip flexion 3+/5, bilateral hip abduction 4-/5, bilateral knee flexors 4-/5, L plantarflexors 4-/5    Time  8    Period  Weeks    Status  New    Target Date  10/01/18      PT LONG TERM GOAL #2   Title  The patient will increase score on MODI to < or = to 50% to demonstrate improved functional ability less limited by  pain.    Baseline  2/14: 62%    Time  8    Period  Weeks    Status  New    Target Date  10/01/18      PT LONG TERM GOAL #3   Title  The patient will demonstrate centralization of symptoms into BLE as demonstrated by no shooting pains/numbness/tingling beyond knees bilaterally to improve function.    Baseline  2/14: down to ankles bilaterally    Time  8    Period  Weeks    Status  New    Target Date  10/01/18      PT LONG TERM GOAL #4   Title  Patient will report worst VAS of < 5/10 in low back to allow increased mobility, iADL performance, and return to exercise for improved quality of life.     Baseline  2/14: worst VAS 8/10     Time  8    Period  Weeks    Status  New    Target Date  10/01/18             Plan - 08/06/18 1114    Clinical Impression Statement  The patient presents to PT with chronic low back pain with sciatica that began about 18 years ago. The patient demonstrates impairments including pain (at rest and with movement), decreased lumbar extension ROM, positive slump and straight leg tests bilaterally, decreased global BLE strength (L more weak Gonzales R), tenderness to palpation from T12-L2 with UPAs, relief with pelvic compression, increased pain with pelvic distraction, pain with anterior sacral glide, muscle tightness and tension throughout paraspinals and in quadratus lumborum bilaterally, and impaired gait mechanics. The patient's pain and current condition puts her at a decreased ability to perform work tasks and participate in activities that she wants to partake in and enjoys. The patient will benefit from skilled PT in order to decrease pain severity and frequency with movement, to improve BLE and core strength, to promote centralization of sciatic symptoms, and to maximize her confidence to move safely without fear of increased pain.    History and Personal Factors relevant to plan of care:  Rheumatoid Arthitis, Depression    Clinical Presentation  Evolving     Clinical Presentation due to:  Recent exacerbation of pain, pain originating from multiple origins, radicular symptoms in BLE    Clinical Decision Making  Moderate    Rehab Potential  Fair    Clinical Impairments Affecting Rehab Potential  (+) motivated, current LOF, education/knowledge, pain well controlled with medications (-) chronicity, pain of multiple origins, occupational demands, pain severity when off medication    PT Frequency  1x / week    PT Duration  8 weeks    PT Treatment/Interventions  ADLs/Self Care Home Management;Aquatic Therapy;Electrical Stimulation;Iontophoresis /ml Dexamethasone;Moist Heat;Traction;Ultrasound;Cryotherapy;Gait training;Stair training;Functional mobility training;Therapeutic activities;Therapeutic exercise;Balance training;Neuromuscular re-education;Patient/family education;Orthotic Fit/Training;Manual techniques;Dry needling;Passive range of motion;Scar mobilization;Taping;Energy conservation;Visual/perceptual remediation/compensation;Compression bandaging    PT Next Visit Plan  progress HEP, incorporiate  stretching, possibly light manual therapy, distraction, forward ball roll, posture     PT Home Exercise Plan  see patient instructions section    Consulted and Agree with Plan of Care  Patient       Patient will benefit from skilled therapeutic intervention in order to improve the following deficits and impairments:  Abnormal gait, Cardiopulmonary status limiting activity, Decreased activity tolerance, Decreased balance, Decreased coordination, Decreased endurance, Decreased range of motion, Decreased mobility, Decreased scar mobility, Decreased strength, Difficulty walking, Hypomobility, Hypermobility, Impaired flexibility, Impaired perceived functional ability, Increased fascial restricitons, Increased muscle spasms, Impaired sensation, Pain, Improper body mechanics, Postural dysfunction, Obesity, Impaired vision/preception  Visit Diagnosis: Chronic  bilateral low back pain with bilateral sciatica  Abnormal posture  Muscle weakness (generalized)     Problem List Patient Active Problem List   Diagnosis Date Noted  . Anxiety 08/06/2018  . Depression 08/06/2018  . Hypertension 08/06/2018  . Rheumatoid arthritis (HCC) 08/06/2018   Sandra Cockayne, SPT  This entire session was performed under direct supervision and direction of a licensed therapist/therapist assistant . I have personally read, edited and approve of the note as written.  Precious Bard, PT, DPT    08/06/2018, 12:01 PM  [facility] MAIN [facility] SERVICES [address] [place], [place], [address] Phone: [phone]   Fax:  [phone]  Name: Natasha Gonzales MRN: [id number] Date of Birth: 08/29/1955

## 2018-08-09 ENCOUNTER — Ambulatory Visit: Payer: Managed Care, Other (non HMO)

## 2018-08-11 ENCOUNTER — Ambulatory Visit: Payer: Managed Care, Other (non HMO)

## 2018-08-20 ENCOUNTER — Ambulatory Visit: Payer: Managed Care, Other (non HMO)

## 2018-08-20 DIAGNOSIS — G8929 Other chronic pain: Secondary | ICD-10-CM

## 2018-08-20 DIAGNOSIS — M6281 Muscle weakness (generalized): Secondary | ICD-10-CM

## 2018-08-20 DIAGNOSIS — M5442 Lumbago with sciatica, left side: Principal | ICD-10-CM

## 2018-08-20 DIAGNOSIS — R293 Abnormal posture: Secondary | ICD-10-CM

## 2018-08-20 DIAGNOSIS — M5441 Lumbago with sciatica, right side: Principal | ICD-10-CM

## 2018-08-20 NOTE — Therapy (Addendum)
[place] [facility] MAIN [facility] SERVICES [address] [place], [place], [address] Phone: [phone]   Fax:  [phone]  Patient Details  Name: Natasha Gonzales MRN: [id number] Date of Birth: 1956/03/24 Referring Provider:  Lonell Face, MD  Encounter Date: 08/20/2018     Patient arrived to session visibly upset. Upon further discussion patient revealed her initial abdominal radiating to back pain has started back again since last week. Reporting that it is the same pain that started her "downward spiral multiple years ago resulting in losing her job, spouse, and mobility". Patient tearful upon divulging history and brought in diagram explaining pain. Therapist and SPT calmed patient, created new plan of care including the following: introducing pelvic floor physical therapy, contacting doctor to inform of returning pain, and calming techniques to reduce fear/crying/depression. Patient agreeable to POC and will see pelvic floor specialist upon next visit as well as call doctor today. Patient left after ~25 minutes much calmer and less upset now that she has a plan.    Sandra Cockayne, SPT This entire session was performed under direct supervision and direction of a licensed therapist/therapist assistant . I have personally read, edited and approve of the note as written.  Precious Bard, PT, DPT   08/20/2018, 8:31 AM   [facility] MAIN [facility] SERVICES [address] [place], [place], [address] Phone: [phone]   Fax:  [phone]

## 2018-08-23 ENCOUNTER — Ambulatory Visit: Payer: Managed Care, Other (non HMO) | Attending: Neurology | Admitting: Physical Therapy

## 2018-08-23 DIAGNOSIS — M5442 Lumbago with sciatica, left side: Secondary | ICD-10-CM | POA: Diagnosis not present

## 2018-08-23 DIAGNOSIS — M5441 Lumbago with sciatica, right side: Secondary | ICD-10-CM | POA: Insufficient documentation

## 2018-08-23 DIAGNOSIS — G8929 Other chronic pain: Secondary | ICD-10-CM | POA: Diagnosis present

## 2018-08-23 DIAGNOSIS — M6281 Muscle weakness (generalized): Secondary | ICD-10-CM

## 2018-08-23 DIAGNOSIS — R293 Abnormal posture: Secondary | ICD-10-CM | POA: Diagnosis present

## 2018-08-23 NOTE — Patient Instructions (Signed)
Morning and evening  Abdominal massage upward from L,  R , center to belly button 3 stroke x 3 , pressure is gentle and light with all fingers flat, not using finger tips    Body scan ( emailed)    _______    Standing:  10 reps on both sides x 3 x day     3 point tap   Feet are hip width Tap forward, center under hip not feet next to each other  Tap middle\, center  Tap back       _Figure-4 and then toe touch to the ground behind you along a diagonal

## 2018-08-23 NOTE — Therapy (Signed)
[facility] MAIN [facility] SERVICES [address] [place], [place], [address] Phone: [phone]   Fax:  [phone]  Physical Therapy Treatment  Patient Details  Name: Natasha Gonzales MRN: [id number] Date of Birth: [birth year] Referring Provider (PT): Dr. Cristopher PeruHemang Shah   Encounter Date: 08/23/2018  PT End of Session - 08/23/18 1407    Visit Number  2    Number of Visits  8    Date for PT Re-Evaluation  10/01/18    Authorization Time Period  1/10 start 08/06/2018    PT Start Time  1300    PT Stop Time  1408    PT Time Calculation (min)  68 min    Equipment Utilized During Treatment  --   pelvic brace   Activity Tolerance  Patient tolerated treatment well;No increased pain    Behavior During Therapy  WFL for tasks assessed/performed       Past Medical History:  Diagnosis Date  . Anxiety   . Depression   . Generalized headaches   . Hypertension   . Ovarian cyst   . Rheumatoid arthritis [facility](HCC)     Past Surgical History:  Procedure Laterality Date  . ABDOMINAL HYSTERECTOMY  1993   total  . adhesion  2001   laprascopic  . TONSILECTOMY/ADENOIDECTOMY WITH MYRINGOTOMY  1984    There were no vitals filed for this visit.  Subjective Assessment - 08/23/18 1304    Subjective  Pt did not do anything over the weekend and her pain is better compared to the rest of the week.  Recently after Xmas, pt has urinary incontinence with coughing, sneezing. The day after her first PT eval, pt noticed her R groin area pain increased more to the level 10/10 when she had the intense pain that lead her to the hospital. The pain eased over the past 14 days.  Pt used At[place] , heating pad, breathing to relieve pain.  She cried. Pt did not use her TENS unit. Pain occurs with PAP smear exam. Gyn Hx:  hysterectomy, laproscopic surgeries x 2 for ovaries cysts. 3 vaginal deliveries with large sized babies with tears/ episiotomies       (Pended)     Pertinent History  Patient  presents to PT with chronic low back pain that began about 18 years ago with sciatica (L worse than R). Her PMH also includes rheumatoid arthritis, hypertension, depression, and anxiety. She states that she had also been previously diagnosed with an L4-L5 neuropathy. She rates her current pain at 4/10, although her pain typically hangs around a 2/10 when under medication. However, her pain gets up to 8/10 during periods in which she is not taking medication. She does state that rarely her pain is less than 2/10, but that she is never "truly without pain". She describes her pain as burning, stabbing, and aching, and reports intermittent shooting pains down her legs bilaterally with occasional numbness/tingling down to her ankles (denies in her feet). She feels that her current medications, heat, stretching, and TENS improve her pain, while prolonged standing, sitting, walking, and lifting increase her pain. She currently performs daily flexibility and stretching exercises which helps her, but she is hoping to expand her physical activity and be able to walk longer distances and perform more rigorous physical activity without pain (such as vacuuming and cleaning her home). 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 She  had one previous bout of PT when her pain first began around 18 years ago following a procedure to remove surgical adhesions in her back/abdomen, which she states helped her pain and functional ability greatly. She also used to see specialists for pelvic pain, but she is not currently.    Limitations  Sitting;Lifting;Standing;Walking;House hold activities;Other (comment)   exercise   How long can you sit comfortably?  30 minutes    How long can you stand comfortably?  20 minutes    How long can you walk comfortably?  15-20 minutes (about 2500 steps)    Diagnostic tests  Lumbar MRI coming up    Patient Stated Goals   to decrease pain and increase function to be able to clean her house (including vacuuming), walking longer distances, exercise    Pain Onset  Other (comment)   approximately 18 years ago        [facility] PT Assessment - 08/23/18 1315      Observation/Other Assessments   Observations  lumbopelvic perturbations with leg movements       Single Leg Stance   Comments  stable on B with hip flexion/ hip ext but required single UE on table for support  , SIJ mobility present       Palpation   SI assessment   L iliac crest higher, L PSIS more anterior.  L SIJ hypomobile./ tender lacking nutation , ( post Tx increased L SIJ mobilty) ,  R FADDIR with restriction ( post Tx increased)     Palpation comment  increased restrictions L> R over abdominal scar , point tenderness at lateral supra pubic area  ( no tenderness  reported post Tx, decreased scar restrictions)                  Pelvic Floor Special Questions - 08/23/18 1353    External Perineal Exam  through clothing, noted tenderness/ tightness at L anterior /posterior pelvic floor mm tightness         [facility] Adult PT Treatment/Exercise - 08/23/18 1401      Neuro Re-ed    Neuro Re-ed Details   cued for deep core coordination  and body scan       Manual Therapy   Manual therapy comments  scar releases with MWM, AP mob Grade III at hips B,  rotations mob,. superior mob at sacrum                PT Short Term Goals - 08/06/18 1124      PT SHORT TERM GOAL #1   Title  Patient will be independent in home exercise program to improve strength/mobility for better functional independence with ADLs.     Baseline  2/14: HEP given    Time  2    Period  Weeks    Status  New    Target Date  08/20/18      PT SHORT TERM GOAL #2   Title  Patient will report centralization of BLE symptoms as demonstrated by decreased episodes of shooting pain/numbness/tingling into BLE.    Baseline  2/14: down to ankles bilaterally with trunk movement     Time  2    Period  Weeks    Status  New    Target Date  08/20/18        PT Long Term Goals - 08/06/18 1126      PT LONG TERM GOAL #1   Title  Patient will increase BLE gross strength to 4/5  as to improve functional strength for independent gait, increased standing tolerance and increased ADL ability.     Baseline  2/14: limitations of L hip flexion 3+/5, bilateral hip abduction 4-/5, bilateral knee flexors 4-/5, L plantarflexors 4-/5    Time  8    Period  Weeks    Status  New    Target Date  10/01/18      PT LONG TERM GOAL #2   Title  The patient will increase score on MODI to < or = to 50% to demonstrate improved functional ability less limited by pain.    Baseline  2/14: 62%    Time  8    Period  Weeks    Status  New    Target Date  10/01/18      PT LONG TERM GOAL #3   Title  The patient will demonstrate centralization of symptoms into BLE as demonstrated by no shooting pains/numbness/tingling beyond knees bilaterally to improve function.    Baseline  2/14: down to ankles bilaterally    Time  8    Period  Weeks    Status  New    Target Date  10/01/18      PT LONG TERM GOAL #4   Title  Patient will report worst VAS of < 5/10 in low back to allow increased mobility, iADL performance, and return to exercise for improved quality of life.     Baseline  2/14: worst VAS 8/10     Time  8    Period  Weeks    Status  New    Target Date  10/01/18            Plan - 08/23/18 1402    Clinical Impression Statement  Pt demo'd improved pelvic girlde alignment, decreased abdominal scar restrictions, and increased SIJ mobility post Tx today utilizing a Pelvic Health PT approach with Specialized Pelvic PT. Pt's R groin pain decreased from 4/10 to 2/10 after Tx and point tenderness resolved at the L supra pubic area. Anticipate further abdominal scar mobility and SIJ mobility will continue to help improve pt's Sx. Added body scan /mindfulness training for pain management after which  pt reported feeling relaxed and with less tensions in the body.  Added deep core strengthening due to abdominal surgeries. Plan to assess pelvic floor further at upcoming sessions. Pt demosntrates L sided tightness of pelvic floor with more scar restrictions on L > R. Plan to apply regional interdependent approach to yield better outcomes. Pt continues to benefit from skilled PT.      Rehab Potential  Fair    Clinical Impairments Affecting Rehab Potential  (+) motivated, current LOF, education/knowledge, pain well controlled with medications (-) chronicity, pain of multiple origins, occupational demands, pain severity when off medication    PT Frequency  1x / week    PT Duration  8 weeks    PT Treatment/Interventions  ADLs/Self Care Home Management;Aquatic Therapy;Electrical Stimulation;Iontophoresis 4mg /ml Dexamethasone;Moist Heat;Traction;Ultrasound;Cryotherapy;Gait training;Stair training;Functional mobility training;Therapeutic activities;Therapeutic exercise;Balance training;Neuromuscular re-education;Patient/family education;Orthotic Fit/Training;Manual techniques;Dry needling;Passive range of motion;Scar mobilization;Taping;Energy conservation;Visual/perceptual remediation/compensation;Compression bandaging    PT Next Visit Plan  progress HEP, incorporiate stretching, possibly light manual therapy, distraction, forward ball roll, posture     PT Home Exercise Plan  see patient instructions section    Consulted and Agree with Plan of Care  Patient       Patient will benefit from skilled therapeutic intervention in order to improve the following deficits and impairments:  Abnormal gait, Cardiopulmonary  status limiting activity, Decreased activity tolerance, Decreased balance, Decreased coordination, Decreased endurance, Decreased range of motion, Decreased mobility, Decreased scar mobility, Decreased strength, Difficulty walking, Hypomobility, Hypermobility, Impaired flexibility, Impaired perceived  functional ability, Increased fascial restricitons, Increased muscle spasms, Impaired sensation, Pain, Improper body mechanics, Postural dysfunction, Obesity, Impaired vision/preception  Visit Diagnosis: Chronic bilateral low back pain with bilateral sciatica  Abnormal posture  Muscle weakness (generalized)     Problem List Patient Active Problem List   Diagnosis Date Noted  . Anxiety 08/06/2018  . Depression 08/06/2018  . Hypertension 08/06/2018  . Rheumatoid arthritis (HCC) 08/06/2018    Mariane Masters ,PT, DPT, E-RYT  08/23/2018, 2:08 PM  [place] [facility] MAIN [facility] SERVICES [address] [place], [place], [address] Phone: [phone]   Fax:  [phone]  Name: Natasha Gonzales MRN: [id number] Date of Birth: 1956-02-09

## 2018-08-24 ENCOUNTER — Ambulatory Visit: Payer: Managed Care, Other (non HMO)

## 2018-08-30 ENCOUNTER — Ambulatory Visit: Payer: Managed Care, Other (non HMO) | Admitting: Physical Therapy

## 2018-08-30 DIAGNOSIS — M6281 Muscle weakness (generalized): Secondary | ICD-10-CM

## 2018-08-30 DIAGNOSIS — R293 Abnormal posture: Secondary | ICD-10-CM

## 2018-08-30 DIAGNOSIS — M5442 Lumbago with sciatica, left side: Secondary | ICD-10-CM | POA: Diagnosis not present

## 2018-08-30 DIAGNOSIS — M5441 Lumbago with sciatica, right side: Principal | ICD-10-CM

## 2018-08-30 DIAGNOSIS — G8929 Other chronic pain: Secondary | ICD-10-CM

## 2018-08-30 NOTE — Therapy (Signed)
[facility] [address] [place], [place], [address] Phone: [phone]   Fax:  [phone]  Physical Therapy Treatment  Patient Details  Name: Natasha Gonzales MRN: [id number] Date of Birth: 1955/08/08 Referring Provider (PT): Dr. Cristopher PeruHemang Shah   Encounter Date: 08/30/2018  PT End of Session - 08/30/18 1415    Visit Number  3    Number of Visits  8    Date for PT Re-Evaluation  10/01/18    Authorization Time Period  1/10 start 08/06/2018    PT Start Time  1306    PT Stop Time  1403    PT Time Calculation (min)  57 min    Equipment Utilized During Treatment  --   pelvic brace   Activity Tolerance  Patient tolerated treatment well;No increased pain    Behavior During Therapy  WFL for tasks assessed/performed       Past Medical History:  Diagnosis Date  . Anxiety   . Depression   . Generalized headaches   . Hypertension   . Ovarian cyst   . Rheumatoid arthritis [facility](HCC)     Past Surgical History:  Procedure Laterality Date  . ABDOMINAL HYSTERECTOMY  1993   total  . adhesion  2001   laprascopic  . TONSILECTOMY/ADENOIDECTOMY WITH MYRINGOTOMY  1984    There were no vitals filed for this visit.  Subjective Assessment - 08/30/18 1410    Subjective  Pt reported she was able to sleep uninterrupted by pain for 6 hours the night after last session which was somethign she had not been able to do in years. Pt was able to sleep weel again for 3 more nights out of last week.  Today, pt reports she "almost does not any pain at all. I feel lighter, crisper, and clean."  Pt started to wean off her pain medication ( Methadone) without communicating with the doctor  but she noticed her blood pressure increased. Pt went back on her pain medication . Pt has not had any radiating pain across last week. Pt was able to pick up laundry from teh floor without pain.     Pertinent History  Patient presents to PT with chronic low back pain that  began about 18 years ago with sciatica (L worse than R). Her PMH also includes rheumatoid arthritis, hypertension, depression, and anxiety. She states that she had also been previously diagnosed with an L4-L5 neuropathy. She rates her current pain at 4/10, although her pain typically hangs around a 2/10 when under medication. However, her pain gets up to 8/10 during periods in which she is not taking medication. She does state that rarely her pain is less than 2/10, but that she is never "truly without pain". She describes her pain as burning, stabbing, and aching, and reports intermittent shooting pains down her legs bilaterally with occasional numbness/tingling down to her ankles (denies in her feet). She feels that her current medications, heat, stretching, and TENS improve her pain, while prolonged standing, sitting, walking, and lifting increase her pain. She currently performs daily flexibility and stretching exercises which helps her, but she is hoping to expand her physical activity and be able to walk longer distances and perform more rigorous physical activity without pain (such as vacuuming and cleaning her home). 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 She had one previous bout of PT when her pain  first began around 18 years ago following a procedure to remove surgical adhesions in her back/abdomen, which she states helped her pain and functional ability greatly. She also used to see specialists for pelvic pain, but she is not currently.    Limitations  Sitting;Lifting;Standing;Walking;House hold activities;Other (comment)   exercise   How long can you sit comfortably?  30 minutes    How long can you stand comfortably?  20 minutes    How long can you walk comfortably?  15-20 minutes (about 2500 steps)    Diagnostic tests  Lumbar MRI coming up    Patient Stated Goals  to decrease pain and increase function to be  able to clean her house (including vacuuming), walking longer distances, exercise    Pain Onset  Other (comment)   approximately 18 years ago        [facility] PT Assessment - 08/30/18 1426      Palpation   Spinal mobility  increased thoracic hypomobility.  ( post Tx: increased post Tx)     SI assessment   increased SIJ mobility ( FADDIR with increased mobility)     Palpation comment  pelvic alignment levelled                    [facility] Adult PT Treatment/Exercise - 08/30/18 1453      Neuro Re-ed    Neuro Re-ed Details   less u pper trap overactivity      Exercises   Exercises  --   see pt instructions      Manual Therapy   Manual therapy comments  Grade III mob PA at thoracic segments ( STM at interspinals/ paraspinals to faciliate depression of thorax                PT Short Term Goals - 08/06/18 1124      PT SHORT TERM GOAL #1   Title  Patient will be independent in home exercise program to improve strength/mobility for better functional independence with ADLs.     Baseline  2/14: HEP given    Time  2    Period  Weeks    Status  New    Target Date  08/20/18      PT SHORT TERM GOAL #2   Title  Patient will report centralization of BLE symptoms as demonstrated by decreased episodes of shooting pain/numbness/tingling into BLE.    Baseline  2/14: down to ankles bilaterally with trunk movement    Time  2    Period  Weeks    Status  New    Target Date  08/20/18        PT Long Term Goals - 08/30/18 1424      PT LONG TERM GOAL #1   Title  Patient will increase BLE gross strength to 4/5 as to improve functional strength for independent gait, increased standing tolerance and increased ADL ability.     Baseline  2/14: limitations of L hip flexion 3+/5, bilateral hip abduction 4-/5, bilateral knee flexors 4-/5, L plantarflexors 4-/5    Time  8    Period  Weeks    Status  New      PT LONG TERM GOAL #2   Title  The patient will increase score on MODI to <  or = to 50% to demonstrate improved functional ability less limited by pain.    Baseline  2/14: 62%    Time  8    Period  Weeks  Status  New      PT LONG TERM GOAL #3   Title  The patient will demonstrate centralization of symptoms into BLE as demonstrated by no shooting pains/numbness/tingling beyond knees bilaterally to improve function.    Baseline  2/14: down to ankles bilaterally    Time  8    Period  Weeks    Status  New      PT LONG TERM GOAL #4   Title  Patient will report worst VAS of < 5/10 in low back to allow increased mobility, iADL performance, and return to exercise for improved quality of life.     Baseline  2/14: worst VAS 8/10     Time  8    Period  Weeks    Status  New      PT LONG TERM GOAL #5   Title  Pt will demo no pelvic asymmetries across 4 weeks with increased SIJ mobility in order to increase gait and sleep with less pain     Time  4    Period  Weeks    Status  New    Target Date  09/27/18      Additional Long Term Goals   Additional Long Term Goals  Yes      PT LONG TERM GOAL #6   Title  Pt will report sleeping uninterrupted by pain for > 6 hours across 5 out 7 days instead of being awakened by pain nightly.     Time  6    Period  Weeks    Status  New    Target Date  10/11/18            Plan - 08/30/18 1416    Clinical Impression Statement Significant improvements:   Pt reported she was able to sleep uninterrupted by pain for 6 hours the night after last session which was somethign she had not been able to do in years. Pt was able to sleep weel again for 3 more nights out of last week. Pt has not had any radiating pain across last week. Pt was able to pick up laundry from teh floor without pain.  Pt with brighter affect.   Focused today on decreasing thoracic mobility which she tolerated with manual Tx. Added work stretches and morning/ nightly flexibility routine into HEP. Plan to educate on body mechanics with household chores.  Educated  pt on activity pacing and anticipate if and when pain may return, it is okay and that peaks and valleys of pain epsiodes will be less and less. Educated the importance of spinal mobility for pain management. Pt voiced understanding.   Pt reported she started to wean off her pain medication ( Methadone) without communicating with the doctor last week but she noticed her blood pressure increased. Pt went back on her pain medication .  Physical Therapist called Dr. Margaretmary EddyShah's office, leaving a message that pt would like to get an appt to consult about decreasing ehr pain medications. Pt was educated about the importance to never change her pain medications without communicating with her doctors. Pt voiced understanding.   Pt continues to benefit from skilled PT.    Rehab Potential  Fair    Clinical Impairments Affecting Rehab Potential  (+) motivated, current LOF, education/knowledge, pain well controlled with medications (-) chronicity, pain of multiple origins, occupational demands, pain severity when off medication    PT Frequency  1x / week    PT Duration  8 weeks  PT Treatment/Interventions  ADLs/Self Care Home Management;Aquatic Therapy;Electrical Stimulation;Iontophoresis 4mg /ml Dexamethasone;Moist Heat;Traction;Ultrasound;Cryotherapy;Gait training;Stair training;Functional mobility training;Therapeutic activities;Therapeutic exercise;Balance training;Neuromuscular re-education;Patient/family education;Orthotic Fit/Training;Manual techniques;Dry needling;Passive range of motion;Scar mobilization;Taping;Energy conservation;Visual/perceptual remediation/compensation;Compression bandaging    PT Next Visit Plan  progress HEP, incorporiate stretching, possibly light manual therapy, distraction, forward ball roll, posture     PT Home Exercise Plan  see patient instructions section    Consulted and Agree with Plan of Care  Patient       Patient will benefit from skilled therapeutic intervention in order to  improve the following deficits and impairments:  Abnormal gait, Cardiopulmonary status limiting activity, Decreased activity tolerance, Decreased balance, Decreased coordination, Decreased endurance, Decreased range of motion, Decreased mobility, Decreased scar mobility, Decreased strength, Difficulty walking, Hypomobility, Hypermobility, Impaired flexibility, Impaired perceived functional ability, Increased fascial restricitons, Increased muscle spasms, Impaired sensation, Pain, Improper body mechanics, Postural dysfunction, Obesity, Impaired vision/preception  Visit Diagnosis: Chronic bilateral low back pain with bilateral sciatica  Abnormal posture  Muscle weakness (generalized)     Problem List Patient Active Problem List   Diagnosis Date Noted  . Anxiety 08/06/2018  . Depression 08/06/2018  . Hypertension 08/06/2018  . Rheumatoid arthritis (HCC) 08/06/2018    Mariane Masters  ,PT, DPT, E-RYT  08/30/2018, 3:06 PM  [place] [facility] MAIN [facility] SERVICES [address] [place], [place], [address] Phone: [phone]   Fax:  [phone]  Name: Natasha Gonzales MRN: [id number] Date of Birth: [birth year]

## 2018-08-30 NOTE — Patient Instructions (Addendum)
Always consult your medical provider about changing your medications. Never decrease or increase your medication dosage without consulting your provider about your medications.   Add to morning and evening stretches:  open book ( handout)   childs pose rocking  10 reps   deep core level 2 ( 6 min)    At work: take stretch breaks _ childs pose at desk   _ 6 directions of spine  3 each     ( side bend like a rainbow)  L and R      ( rotate arms swings without moving pelvis / knees(     (mini squat. Reach hands behind back and chest lifts, stand up )    _3 point tap ( last session)

## 2018-09-02 ENCOUNTER — Encounter: Payer: Managed Care, Other (non HMO) | Admitting: Physical Therapy

## 2018-09-10 ENCOUNTER — Ambulatory Visit: Payer: Managed Care, Other (non HMO) | Admitting: Physical Therapy

## 2018-09-14 ENCOUNTER — Ambulatory Visit: Payer: Managed Care, Other (non HMO) | Admitting: Physical Therapy

## 2018-09-14 ENCOUNTER — Telehealth: Payer: Self-pay | Admitting: Physical Therapy

## 2018-09-14 NOTE — Telephone Encounter (Signed)
Physical Therapist called pt to f/u re: her self-quarantine and results from COVID-19 tests. Pt reported her test was negative. 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    Recommended to patient the following HEP:   _Use a 3-ringer binder for laptop.   _Take stretches: set a timer for 1 hour :  Lengthening spine  6 directions spine   _For de-stressing, perform deep core level 1 ( paced breathing) from last session   Also discussed how pt responded to last session 08/30/18. Pt stated she was able to sleep without interruption of pain for 6 hours. This occurred across 3 nights.   Pt was explained about the closure of the Rehab clinic for 2 weeks.

## 2019-02-24 ENCOUNTER — Other Ambulatory Visit: Payer: Self-pay | Admitting: Internal Medicine

## 2019-02-24 DIAGNOSIS — Z1231 Encounter for screening mammogram for malignant neoplasm of breast: Secondary | ICD-10-CM

## 2019-04-04 ENCOUNTER — Ambulatory Visit
Admission: RE | Admit: 2019-04-04 | Discharge: 2019-04-04 | Disposition: A | Discharge status: Home / Self Care | Payer: Managed Care, Other (non HMO) | Source: Ambulatory Visit | Attending: Internal Medicine | Admitting: Internal Medicine

## 2019-04-04 DIAGNOSIS — Z1231 Encounter for screening mammogram for malignant neoplasm of breast: Secondary | ICD-10-CM | POA: Diagnosis present

## 2019-06-14 ENCOUNTER — Ambulatory Visit: Payer: Managed Care, Other (non HMO) | Attending: Internal Medicine

## 2019-06-14 DIAGNOSIS — Z20822 Contact with and (suspected) exposure to covid-19: Secondary | ICD-10-CM

## 2019-06-16 LAB — NOVEL CORONAVIRUS, NAA: SARS-CoV-2, NAA: NOT DETECTED

## 2019-11-08 ENCOUNTER — Other Ambulatory Visit: Payer: Self-pay | Admitting: Student in an Organized Health Care Education/Training Program

## 2019-11-08 ENCOUNTER — Ambulatory Visit
Payer: Managed Care, Other (non HMO) | Attending: Student in an Organized Health Care Education/Training Program | Admitting: Student in an Organized Health Care Education/Training Program

## 2019-11-08 ENCOUNTER — Encounter: Payer: Self-pay | Admitting: Student in an Organized Health Care Education/Training Program

## 2019-11-08 ENCOUNTER — Other Ambulatory Visit: Payer: Self-pay

## 2019-11-08 VITALS — BP 155/74 | HR 87 | Temp 97.2°F | Resp 16 | Ht 62.0 in | Wt 200.0 lb

## 2019-11-08 DIAGNOSIS — M5136 Other intervertebral disc degeneration, lumbar region: Secondary | ICD-10-CM | POA: Insufficient documentation

## 2019-11-08 DIAGNOSIS — G894 Chronic pain syndrome: Secondary | ICD-10-CM

## 2019-11-08 DIAGNOSIS — M069 Rheumatoid arthritis, unspecified: Secondary | ICD-10-CM | POA: Insufficient documentation

## 2019-11-08 DIAGNOSIS — M533 Sacrococcygeal disorders, not elsewhere classified: Secondary | ICD-10-CM | POA: Diagnosis present

## 2019-11-08 NOTE — Progress Notes (Signed)
Patient: Natasha Gonzales  Service Category: E/M  Provider: Gillis Santa, MD  DOB: Oct 13, 1955  DOS: 11/08/2019  Referring Provider: Cletis Athens, MD  MRN: [id number]  Setting: Ambulatory outpatient  PCP: Cletis Athens, MD  Type: New Patient  Specialty: Interventional Pain Management    Location: Office  Delivery: Face-to-face     Primary Reason(s) for Visit: Encounter for initial evaluation of one or more chronic problems (new to examiner) potentially causing chronic pain, and posing a threat to normal musculoskeletal function. (Level of risk: High) CC: Groin Pain (right) and Back Pain (l4 l5)  HPI  Ms. Wartman is a 64 y.o. year old, female patient, who comes today to see [place] for the first time for an initial evaluation of her chronic pain. She has Anxiety; Depression; Hypertension; Rheumatoid arthritis ([place]); Chronic pain syndrome; Lumbar degenerative disc disease; and Sacroiliac joint pain on their problem list. Today she comes in for evaluation of her Groin Pain (right) and Back Pain (l4 l5)  Pain Assessment: Location: Right Groin Radiating: around to lower back and down inside of legs to the feet bilateral Onset: More than a month ago Duration: Chronic pain Quality: Sharp, Discomfort, Moaning, Jabbing Severity: 2 /10 (subjective, self-reported pain score)  Note: Reported level is compatible with observation.                         When using our objective Pain Scale, levels between 6 and 10/10 are said to belong in an emergency room, as it progressively worsens from a 6/10, described as severely limiting, requiring emergency care not usually available at an outpatient pain management facility. At a 6/10 level, communication becomes difficult and requires great effort. Assistance to reach the emergency department may be required. Facial flushing and profuse sweating along with potentially dangerous increases in heart rate and blood pressure will be evident. Effect on ADL: prolonged walking or  standing Timing: Intermittent Modifying factors: PT and home therapy, accupunture, tens, streching, yoga BP: (!) 155/74  HR: 87  Onset and Duration: Sudden and Present longer than 3 months Cause of pain: Unknown Severity: NAS-11 at its worse: 8/10, NAS-11 at its best: 2/10 and NAS-11 on the average: 3/10 Timing: Morning, Night, During activity or exercise and After a period of immobility Aggravating Factors: Bending, Intercourse (sex), Kneeling, Lifiting, Prolonged sitting, Prolonged standing, Squatting, Surgery made it worse, Walking and Walking uphill Alleviating Factors: Acupuncture, Stretching, Cold packs, Hot packs, Lying down, Medications, Resting, TENS and Warm showers or baths Associated Problems: Fatigue, Numbness, Sadness, Swelling, Temperature changes, Tingling, Weakness, Pain that wakes patient up and Pain that does not allow patient to sleep Quality of Pain: Aching, Burning, Constant, Exhausting, Hot, Sharp, Shooting, Stabbing and Tiring Previous Examinations or Tests: CT scan, MRI scan, Nerve block, X-rays, Nerve conduction test, Neurological evaluation, Neurosurgical evaluation, Chiropractic evaluation and Psychiatric evaluation Previous Treatments: Chiropractic manipulations, Epidural steroid injections, Narcotic medications, Physical Therapy, Relaxation therapy, Stretching exercises, TENS and Trigger point injections  The patient comes into the clinics today for the first time for a chronic pain management evaluation.  Patient is a pleasant 64 year old female who presents with a chief complaint of right groin pain that radiates to her lower back and also in the medial portion of her legs down to her feet.  Patient has a history of adhesions from hysterectomy and cystoscopy.  She is being referred from Dr. Rebecka Apley for chronic pain management by way of methadone management.  Patient has been on  methadone for many years.  She has reduced her dose down over the last couple of years  and she is currently taking 15 mg daily, 5 mg 3 times daily as needed.  She denies any foot numbness leg weakness urinary incontinence.  She does have a history of lumbar degenerative joint disease.  Patient also utilizes complementary and integrative pain management strategies including TENS unit, massage therapy, acupuncture, physical therapy.  I commended her on this.  She is interested in having her methadone managed by the pain clinic as Dr. Rebecka Apley is going to be retiring soon.  Of note she has tried Neurontin in the past which was not very helpful.  Historic Controlled Substance Pharmacotherapy Review  PMP and historical list of controlled substances:  10/20/2019 3 10/20/2019  Methadone Hcl 5 MG Tablet  90.00 [address] [address]" Rosita Kea [id number] [place] [address]" Thr [phone] 0 45.00 MME Private Pay [place]   Medications: The patient did not bring the medication(s) to the appointment, as requested in our "New Patient Package" Pharmacodynamics: Desired effects: Analgesia: The patient reports >50% benefit. Reported improvement in function: The patient reports medication allows her to accomplish basic ADLs. Clinically meaningful improvement in function (CMIF): Sustained CMIF goals met Perceived effectiveness: Described as relatively effective, allowing for increase in activities of daily living (ADL) Undesirable effects: Side-effects or Adverse reactions: None reported Historical Monitoring: The patient  reports no history of drug use. List of all UDS Test(s): No results found for: MDMA, COCAINSCRNUR, [place], [place], CANNABQUANT, THCU, [place] List of other Serum/Urine Drug Screening Test(s):  No results found for: AMPHSCRSER, BARBSCRSER, BENZOSCRSER, COCAINSCRSER, COCAINSCRNUR, PCPSCRSER, PCPQUANT, THCSCRSER, THCU, CANNABQUANT, OPIATESCRSER, OXYSCRSER, PROPOXSCRSER, ETH Historical Background Evaluation: [place] PMP: PDMP reviewed during this encounter. Six (6)  year initial data search conducted.             [place] Department of public safety, offender search: Editor, commissioning Information) Non-contributory Risk Assessment Profile: Aberrant behavior: None observed or detected today Risk factors for fatal opioid overdose: None identified today Fatal overdose hazard ratio (HR): Calculation deferred Non-fatal overdose hazard ratio (HR): Calculation deferred Risk of opioid abuse or dependence: 0.7-3.0% with doses ? 36 MME/day and 6.1-26% with doses ? 120 MME/day. Substance use disorder (SUD) risk level: See below Personal History of Substance Abuse (SUD-Substance use disorder):  Alcohol: Negative  Illegal Drugs: Negative  Rx Drugs: Negative  ORT Risk Level calculation: Low Risk Opioid Risk Tool - 11/08/19 1024      Family History of Substance Abuse   Alcohol  Negative    Illegal Drugs  Negative    Rx Drugs  Negative      Personal History of Substance Abuse   Alcohol  Negative    Illegal Drugs  Negative    Rx Drugs  Negative      Age   Age between 78-45 years   No      History of Preadolescent Sexual Abuse   History of Preadolescent Sexual Abuse  Negative or Female      Psychological Disease   Psychological Disease  Negative    Depression  Negative      Total Score   Opioid Risk Tool Scoring  0    Opioid Risk Interpretation  Low Risk      ORT Scoring interpretation table:  Score <3 = Low Risk for SUD  Score between 4-7 = Moderate Risk for SUD  Score >8 = High Risk for Opioid Abuse   PHQ-2  Depression Scale:  Total score:    PHQ-2 Scoring interpretation table: (Score and probability of major depressive disorder)  Score 0 = No depression  Score 1 = 15.4% Probability  Score 2 = 21.1% Probability  Score 3 = 38.4% Probability  Score 4 = 45.5% Probability  Score 5 = 56.4% Probability  Score 6 = 78.6% Probability   PHQ-9 Depression Scale:  Total score:    PHQ-9 Scoring interpretation table:  Score 0-4 = No depression  Score 5-9 = Mild  depression  Score 10-14 = Moderate depression  Score 15-19 = Moderately severe depression  Score 20-27 = Severe depression (2.4 times higher risk of SUD and 2.89 times higher risk of overuse)   Pharmacologic Plan: As per protocol, I have not taken over any controlled substance management, pending the results of ordered tests and/or consults.            Initial impression: Pending review of available data and ordered tests.  Meds   Current Outpatient Medications:  .  aspirin EC 81 MG tablet, Take 81 mg by mouth daily., Disp: , Rfl:  .  fluticasone (FLONASE) 50 MCG/ACT nasal spray, Place into both nostrils daily. As needed, Disp: , Rfl:  .  hydroxychloroquine (PLAQUENIL) 200 MG tablet, Take 200 mg by mouth daily. 2 tablets, Disp: , Rfl:  .  ibuprofen (ADVIL,MOTRIN) 800 MG tablet, Take 800 mg by mouth every 6 (six) hours as needed., Disp: , Rfl:  .  leflunomide (ARAVA) 10 MG tablet, Take 10 mg by mouth daily. Currently every other day until March, Disp: , Rfl:  .  lisinopril (PRINIVIL,ZESTRIL) 5 MG tablet, Take 20 mg by mouth daily. , Disp: , Rfl:  .  methadone (DOLOPHINE) 10 MG tablet, Take 5 mg by mouth in the morning, at noon, and at bedtime. Pt is reducing this medication, Disp: , Rfl:  .  Multiple Vitamin (MULTIVITAMIN) capsule, Take 1 capsule by mouth daily., Disp: , Rfl:  .  rosuvastatin (CRESTOR) 40 MG tablet, Take 40 mg by mouth daily., Disp: , Rfl:  .  folic acid (FOLVITE) 1 MG tablet, Take 1 mg by mouth daily., Disp: , Rfl:  .  LORazepam (ATIVAN) 1 MG tablet, Take 1 mg by mouth., Disp: , Rfl:  .  methotrexate (RHEUMATREX) 2.5 MG tablet, Take 2.5 mg by mouth. Caution:Chemotherapy. Protect from light. Take 6 tablets by mouth every 7 days, Disp: , Rfl:    ROS  Cardiovascular: Daily Aspirin intake and High blood pressure Pulmonary or Respiratory: Snoring  Neurological: No reported neurological signs or symptoms such as seizures, abnormal skin sensations, urinary and/or fecal  incontinence, being born with an abnormal open spine and/or a tethered spinal cord Psychological-Psychiatric: Anxiousness and Difficulty sleeping and or falling asleep Gastrointestinal: No reported gastrointestinal signs or symptoms such as vomiting or evacuating blood, reflux, heartburn, alternating episodes of diarrhea and constipation, inflamed or scarred liver, or pancreas or irrregular and/or infrequent bowel movements Genitourinary: Passing kidney stones Hematological: No reported hematological signs or symptoms such as prolonged bleeding, low or poor functioning platelets, bruising or bleeding easily, hereditary bleeding problems, low energy levels due to low hemoglobin or being anemic Endocrine: No reported endocrine signs or symptoms such as high or low blood sugar, rapid heart rate due to high thyroid levels, obesity or weight gain due to slow thyroid or thyroid disease Rheumatologic: Rheumatoid arthritis Musculoskeletal: Negative for myasthenia gravis, muscular dystrophy, multiple sclerosis or malignant hyperthermia Work History: Working full time  Allergies  Ms. Ermalinda Barrios  has No Known Allergies.  Laboratory Chemistry Profile   Renal Lab Results  Component Value Date   BUN 13 01/24/2014   CREATININE 1.05 01/24/2014   GFRAA >60 01/24/2014   GFRNONAA 59 (L) 01/24/2014   PROTEINUR Negative 01/24/2014     Electrolytes Lab Results  Component Value Date   NA 138 01/24/2014   K 4.0 01/24/2014   CL 104 01/24/2014   CALCIUM 8.9 01/24/2014     Hepatic Lab Results  Component Value Date   AST 22 01/24/2014   ALT 30 01/24/2014   ALBUMIN 4.2 01/24/2014   ALKPHOS 74 01/24/2014   LIPASE 129 01/24/2014     ID Lab Results  Component Value Date   SARSCOV2NAA Not Detected 06/14/2019     Bone No results found for: [place], FR102TR1NBV, AP0141CV0, DT1438OI7, 25OHVITD1, 25OHVITD2, 25OHVITD3, TESTOFREE, TESTOSTERONE   Endocrine Lab Results  Component Value Date   GLUCOSE 96  01/24/2014   GLUCOSEU Negative 01/24/2014     Neuropathy No results found for: VITAMINB12, FOLATE, HGBA1C, HIV   CNS No results found for: COLORCSF, APPEARCSF, RBCCOUNTCSF, WBCCSF, POLYSCSF, LYMPHSCSF, EOSCSF, PROTEINCSF, GLUCCSF, JCVIRUS, CSFOLI, IGGCSF, LABACHR, ACETBL, LABACHR, ACETBL   Inflammation (CRP: Acute  ESR: Chronic) No results found for: CRP, ESRSEDRATE, LATICACIDVEN   Rheumatology No results found for: RF, ANA, LABURIC, URICUR, LYMEIGGIGMAB, LYMEABIGMQN, HLAB27   Coagulation Lab Results  Component Value Date   PLT 272 01/24/2014     Cardiovascular Lab Results  Component Value Date   HGB 13.1 01/24/2014   HCT 40.0 01/24/2014     Screening Lab Results  Component Value Date   SARSCOV2NAA Not Detected 06/14/2019     Cancer No results found for: CEA, CA125, LABCA2   Allergens No results found for: ALMOND, APPLE, ASPARAGUS, AVOCADO, BANANA, BARLEY, BASIL, BAYLE[place], GREENBEAN, LIMABEAN, WHITEBEAN, BEEFIGE, REDBEET, BLUEBERRY, BROCCOLI, CABBAGE, MELON, CARROT, CASEIN, CASHEWNUT, CAULIFLOWER, CELERY     Note: Lab results reviewed.   [place]  Drug: Ms. Padmanabhan  reports no history of drug use. Alcohol:  reports previous alcohol use. Tobacco:  reports that she has been smoking cigarettes. She has smoked for the past 25.00 years. She has never used smokeless tobacco. Medical:  has a past medical history of Anxiety, Depression, Generalized headaches, Hyperlipidemia, Hypertension, Ovarian cyst, and Rheumatoid arthritis ([place]). Family: family history includes Cancer in her brother and another family member; Heart disease in her father and mother; Hypertension in her mother; Kidney disease in her mother.  Past Surgical History:  Procedure Laterality Date  . ABDOMINAL HYSTERECTOMY  1993   total  . adhesion  2001   laprascopic  . JOINT REPLACEMENT    . OVARIAN CYST REMOVAL    . TONSILECTOMY/ADENOIDECTOMY WITH MYRINGOTOMY  1984   Active Ambulatory Problems    Diagnosis  Date Noted  . Anxiety 08/06/2018  . Depression 08/06/2018  . Hypertension 08/06/2018  . Rheumatoid arthritis ([place]) 08/06/2018  . Chronic pain syndrome 03/14/2014  . Lumbar degenerative disc disease 11/08/2019  . Sacroiliac joint pain 11/08/2019   Resolved Ambulatory Problems    Diagnosis Date Noted  . No Resolved Ambulatory Problems   Past Medical History:  Diagnosis Date  . Generalized headaches   . Hyperlipidemia   . Ovarian cyst    Constitutional Exam  General appearance: Well nourished, well developed, and well hydrated. In no apparent acute distress Vitals:   11/08/19 1005  BP: (!) 155/74  Pulse: 87  Resp: 16  Temp: (!) 97.2 F (36.2 C)  SpO2: 99%  Weight: 200 lb (90.7 kg)  Height: '5\' 2"'  (1.575 m)   BMI Assessment: Estimated body mass index is 36.58 kg/m as calculated from the following:   Height as of this encounter: '5\' 2"'  (1.575 m).   Weight as of this encounter: 200 lb (90.7 kg).  BMI interpretation table: BMI level Category Range association with higher incidence of chronic pain  <18 kg/m2 Underweight   18.5-24.9 kg/m2 Ideal body weight   25-29.9 kg/m2 Overweight Increased incidence by 20%  30-34.9 kg/m2 Obese (Class I) Increased incidence by 68%  35-39.9 kg/m2 Severe obesity (Class II) Increased incidence by 136%  >40 kg/m2 Extreme obesity (Class III) Increased incidence by 254%   Patient's current BMI Ideal Body weight  Body mass index is 36.58 kg/m. Ideal body weight: 50.1 kg (110 lb 7.2 oz) Adjusted ideal body weight: 66.3 kg (146 lb 4.3 oz)   BMI Readings from Last 4 Encounters:  11/08/19 36.58 kg/m   Wt Readings from Last 4 Encounters:  11/08/19 200 lb (90.7 kg)    Psych/Mental status: Alert, oriented x 3 (person, place, & time)       Eyes: PERLA Respiratory: No evidence of acute respiratory distress  Cervical Spine Exam  Skin & Axial Inspection: No masses, redness, edema, swelling, or associated skin lesions Alignment:  Symmetrical Functional ROM: Unrestricted ROM      Stability: No instability detected Muscle Tone/Strength: Functionally intact. No obvious neuro-muscular anomalies detected. Sensory (Neurological): Unimpaired Palpation: No palpable anomalies              Upper Extremity (UE) Exam    Side: Right upper extremity  Side: Left upper extremity  Skin & Extremity Inspection: Skin color, temperature, and hair growth are WNL. No peripheral edema or cyanosis. No masses, redness, swelling, asymmetry, or associated skin lesions. No contractures.  Skin & Extremity Inspection: Skin color, temperature, and hair growth are WNL. No peripheral edema or cyanosis. No masses, redness, swelling, asymmetry, or associated skin lesions. No contractures.  Functional ROM: Unrestricted ROM          Functional ROM: Unrestricted ROM          Muscle Tone/Strength: Functionally intact. No obvious neuro-muscular anomalies detected.  Muscle Tone/Strength: Functionally intact. No obvious neuro-muscular anomalies detected.  Sensory (Neurological): Unimpaired          Sensory (Neurological): Unimpaired          Palpation: No palpable anomalies              Palpation: No palpable anomalies              Provocative Test(s):  Phalen's test: deferred Tinel's test: deferred Apley's scratch test (touch opposite shoulder):  Action 1 (Across chest): deferred Action 2 (Overhead): deferred Action 3 (LB reach): deferred   Provocative Test(s):  Phalen's test: deferred Tinel's test: deferred Apley's scratch test (touch opposite shoulder):  Action 1 (Across chest): deferred Action 2 (Overhead): deferred Action 3 (LB reach): deferred    Thoracic Spine Area Exam  Skin & Axial Inspection: No masses, redness, or swelling Alignment: Symmetrical Functional ROM: Unrestricted ROM Stability: No instability detected Muscle Tone/Strength: Functionally intact. No obvious neuro-muscular anomalies detected. Sensory (Neurological):  Unimpaired Muscle strength & Tone: No palpable anomalies  Lumbar Exam  Skin & Axial Inspection: No masses, redness, or swelling Alignment: Symmetrical Functional ROM: Unrestricted ROM       Stability: No instability detected Muscle Tone/Strength: Functionally intact. No obvious neuro-muscular anomalies detected. Sensory (Neurological): Musculoskeletal pain pattern Palpation: No  palpable anomalies       Provocative Tests: Hyperextension/rotation test: deferred today       Lumbar quadrant test (Kemp's test): deferred today       Lateral bending test: deferred today       Patrick's Maneuver: deferred today                   FABER* test: deferred today                   S-I anterior distraction/compression test: deferred today         S-I lateral compression test: deferred today         S-I Thigh-thrust test: deferred today         S-I Gaenslen's test: deferred today         *(Flexion, ABduction and External Rotation)  Gait & Posture Assessment  Ambulation: Unassisted Gait: Relatively normal for age and body habitus Posture: WNL   Lower Extremity Exam    Side: Right lower extremity  Side: Left lower extremity  Stability: No instability observed          Stability: No instability observed          Skin & Extremity Inspection: Skin color, temperature, and hair growth are WNL. No peripheral edema or cyanosis. No masses, redness, swelling, asymmetry, or associated skin lesions. No contractures.  Skin & Extremity Inspection: Skin color, temperature, and hair growth are WNL. No peripheral edema or cyanosis. No masses, redness, swelling, asymmetry, or associated skin lesions. No contractures.  Functional ROM: Unrestricted ROM                  Functional ROM: Unrestricted ROM                  Muscle Tone/Strength: Functionally intact. No obvious neuro-muscular anomalies detected.  Muscle Tone/Strength: Functionally intact. No obvious neuro-muscular anomalies detected.  Sensory (Neurological):  Musculoskeletal pain pattern        Sensory (Neurological): Musculoskeletal pain pattern        DTR: Patellar: deferred today Achilles: deferred today Plantar: deferred today  DTR: Patellar: deferred today Achilles: deferred today Plantar: deferred today  Palpation: No palpable anomalies  Palpation: No palpable anomalies   Assessment  Primary Diagnosis & Pertinent Problem List: The primary encounter diagnosis was Chronic pain syndrome. Diagnoses of Rheumatoid arthritis involving multiple sites, unspecified whether rheumatoid factor present ([place]), Lumbar degenerative disc disease, and Sacroiliac joint pain were also pertinent to this visit.  Visit Diagnosis (New problems to examiner): 1. Chronic pain syndrome   2. Rheumatoid arthritis involving multiple sites, unspecified whether rheumatoid factor present ([place])   3. Lumbar degenerative disc disease   4. Sacroiliac joint pain    Plan of Care (Initial workup plan)  Note: Ms. Both was reminded that as per protocol, today's visit has been an evaluation only. We have not taken over the patient's controlled substance management.  General Recommendations: The pain condition that the patient suffers from is best treated with a multidisciplinary approach that involves an increase in physical activity to prevent de-conditioning and worsening of the pain cycle, as well as psychological counseling (formal and/or informal) to address the co-morbid psychological affects of pain. Treatment will often involve judicious use of pain medications and interventional procedures to decrease the pain, allowing the patient to participate in the physical activity that will ultimately produce long-lasting pain reductions. The goal of the multidisciplinary approach is to return the patient to a higher  level of overall function and to restore their ability to perform activities of daily living.  Very pleasant 64 year old female presents for a chief complaint of chronic  low back pain with radiation into bilateral legs.  She is being referred from her primary care provider who is going to be retiring in the future and she is hoping to have her methadone managed here.  Her current daily dose is 15 mg, 5 mg 3 times daily.  Patient will need to complete baseline urine toxicology screen and see Dr. Shea Evans with psychiatry which is customary for new patients being considered for chronic opioid therapy.  Afterwards, patient will sign pain contract neck and take over her methadone at 5 mg 3 times daily as needed.  She expresses that she would like to continue weaning down into the future.  I commended her on utilizing complementary and integrative pain management techniques such as acupuncture, TENS, physical therapy.  I am happy to place a referral for physical therapy in the future and/or aquatic therapy.  Patient endorsed understanding.  Follow-up after visit with Dr. Shea Evans  Of note of also requested the patient's most recent EKG to be sent from Dr. Paticia Stack office so we can evaluate her QTC in the context of methadone.  Lab Orders     Compliance Drug Analysis, Ur  Referral Orders     Ambulatory referral to Psychology  Pharmacotherapy (current): Medications ordered:  No orders of the defined types were placed in this encounter.  Medications administered during this visit: Grenda Lora. [place] had no medications administered during this visit.   Pharmacological management options:  Opioid Analgesics: The patient was informed that there is no guarantee that she would be a candidate for opioid analgesics. The decision will be made following CDC guidelines. This decision will be based on the results of diagnostic studies, as well as Ms. Brion's risk profile.  Methadone 5 mg 3 times daily as needed  Membrane stabilizer: Tried and failed  Muscle relaxant: To be determined at a later time  NSAID: To be determined at a later time  Other analgesic(s): To be determined at a  later time   Interventional management options: Ms. Papania was informed that there is no guarantee that she would be a candidate for interventional therapies. The decision will be based on the results of diagnostic studies, as well as Ms. Deere's risk profile.  Procedure(s) under consideration:  Lumbar epidural steroid injection Lumbar facet medial branch block   Provider-requested follow-up: Return for After Psychological evaluation.  Future Appointments  Date Time Provider [place]  11/18/2019 10:45 AM Cletis Athens, MD [facility] None    Note by: Gillis Santa, MD Date: 11/08/2019; Time: 10:58 AM

## 2019-11-08 NOTE — Patient Instructions (Signed)

## 2019-11-11 LAB — COMPLIANCE DRUG ANALYSIS, UR

## 2019-11-18 ENCOUNTER — Ambulatory Visit: Payer: Managed Care, Other (non HMO) | Admitting: Internal Medicine

## 2019-11-18 ENCOUNTER — Encounter: Payer: Self-pay | Admitting: Internal Medicine

## 2019-11-18 ENCOUNTER — Other Ambulatory Visit: Payer: Self-pay

## 2019-11-18 VITALS — BP 135/74 | HR 86 | Wt 203.5 lb

## 2019-11-18 DIAGNOSIS — G894 Chronic pain syndrome: Secondary | ICD-10-CM

## 2019-11-18 DIAGNOSIS — I10 Essential (primary) hypertension: Secondary | ICD-10-CM | POA: Diagnosis not present

## 2019-11-18 DIAGNOSIS — H6123 Impacted cerumen, bilateral: Secondary | ICD-10-CM | POA: Insufficient documentation

## 2019-11-18 DIAGNOSIS — M5136 Other intervertebral disc degeneration, lumbar region: Secondary | ICD-10-CM

## 2019-11-18 DIAGNOSIS — M0579 Rheumatoid arthritis with rheumatoid factor of multiple sites without organ or systems involvement: Secondary | ICD-10-CM

## 2019-11-18 MED ORDER — METHADONE HCL 10 MG PO TABS: 5.0000 mg | ORAL_TABLET | Freq: Three times a day (TID) | ORAL | 0 refills | Status: DC

## 2019-11-18 NOTE — Assessment & Plan Note (Signed)
Patient see Dr. Gavin Potters for that.

## 2019-11-18 NOTE — Assessment & Plan Note (Signed)
Chronic problem. 

## 2019-11-18 NOTE — Progress Notes (Signed)
Established Patient Office Visit  Subjective:  Patient ID: Natasha Gonzales, female    DOB: 28-Dec-1955  Age: 64 y.o. MRN: [id number]  CC:  Chief Complaint  Patient presents with  . Pain    patient here for pain med refill     HPI  Natasha Gonzales presents for general physical checkup she also complains of pain in the right groin radiating to the right leg she has been taking methadone for that.  Past Medical History:  Diagnosis Date  . Anxiety   . Depression   . Generalized headaches   . Hyperlipidemia   . Hypertension   . Ovarian cyst   . Rheumatoid arthritis [facility])     Past Surgical History:  Procedure Laterality Date  . ABDOMINAL HYSTERECTOMY  1993   total  . adhesion  2001   laprascopic  . JOINT REPLACEMENT    . OVARIAN CYST REMOVAL    . TONSILECTOMY/ADENOIDECTOMY WITH MYRINGOTOMY  1984    Family History  Problem Relation Age of Onset  . Cancer Brother   . Cancer Other   . Hypertension Mother   . Heart disease Mother   . Kidney disease Mother   . Heart disease Father   . Breast cancer Neg Hx     Social History   Socioeconomic History  . Marital status: Divorced    Spouse name: Not on file  . Number of children: Not on file  . Years of education: Not on file  . Highest education level: Not on file  Occupational History  . Not on file  Tobacco Use  . Smoking status: Current Every Day Smoker    Years: 25.00    Types: Cigarettes  . Smokeless tobacco: Never Used  . Tobacco comment: Stress smoker. 1 cig every now and then  Substance and Sexual Activity  . Alcohol use: Not Currently  . Drug use: Never  . Sexual activity: Not on file  Other Topics Concern  . Not on file  Social History Narrative  . Not on file   Social Determinants of Health   Financial Resource Strain:   . Difficulty of Paying Living Expenses:   Food Insecurity:   . Worried About Programme researcher, broadcasting/film/video in the Last Year:   . Barista in the Last Year:   Transportation  Needs:   . Freight forwarder (Medical):   Marland Kitchen Lack of Transportation (Non-Medical):   Physical Activity:   . Days of Exercise per Week:   . Minutes of Exercise per Session:   Stress:   . Feeling of Stress :   Social Connections:   . Frequency of Communication with Friends and Family:   . Frequency of Social Gatherings with Friends and Family:   . Attends Religious Services:   . Active Member of Clubs or Organizations:   . Attends Banker Meetings:   Marland Kitchen Marital Status:   Intimate Partner Violence:   . Fear of Current or Ex-Partner:   . Emotionally Abused:   Marland Kitchen Physically Abused:   . Sexually Abused:      Current Outpatient Medications:  .  aspirin EC 81 MG tablet, Take 81 mg by mouth daily., Disp: , Rfl:  .  fluticasone (FLONASE) 50 MCG/ACT nasal spray, Place into both nostrils daily. As needed, Disp: , Rfl:  .  hydroxychloroquine (PLAQUENIL) 200 MG tablet, Take 200 mg by mouth daily. 2 tablets, Disp: , Rfl:  .  ibuprofen (ADVIL,MOTRIN) 800 MG tablet,  Take 800 mg by mouth every 6 (six) hours as needed., Disp: , Rfl:  .  lisinopril (PRINIVIL,ZESTRIL) 5 MG tablet, Take 20 mg by mouth daily. , Disp: , Rfl:  .  methadone (DOLOPHINE) 10 MG tablet, Take 0.5 tablets (5 mg total) by mouth in the morning, at noon, and at bedtime. Pt is reducing this medication, Disp: 90 tablet, Rfl: 0 .  Multiple Vitamin (MULTIVITAMIN) capsule, Take 1 capsule by mouth daily., Disp: , Rfl:  .  rosuvastatin (CRESTOR) 40 MG tablet, Take 40 mg by mouth daily., Disp: , Rfl:    No Known Allergies  ROS Review of Systems  Constitutional: Negative.   HENT: Positive for ear pain and hearing loss.   Eyes: Negative.   Respiratory: Negative.   Cardiovascular: Negative.   Gastrointestinal: Negative.   Endocrine: Negative.   Genitourinary: Negative.   Musculoskeletal:       Pain in the right lumbar and inguinal region radiating to the groin area.  Pain comes intermittently like electric shock.       Objective:    Physical Exam  .  Patient is well-nourished female in no acute distress head normocephalic pupils reactive sclera nonicteric tongue is moist papillated neck is supple jugular venous pressure is not elevated carotid upstroke is 2+ without any bruit on examination of the cardiovascular system apical impulse is palpable in the fifth intercostal space first and second heart sounds are normal chest is clear abdomen is soft she is tender in the right lumbar region straight leg raising test is positive there is no pedal edema skin is normal no rashes noted.  Right and left ear examination revealed wax in both ears. BP 135/74   Pulse 86   Wt 203 lb 8 oz (92.3 kg)   BMI 37.22 kg/m  Wt Readings from Last 3 Encounters:  11/18/19 203 lb 8 oz (92.3 kg)  11/08/19 200 lb (90.7 kg)     Health Maintenance Due  Topic Date Due  . Hepatitis C Screening  Never done  . COVID-19 Vaccine (1) Never done  . HIV Screening  Never done  . TETANUS/TDAP  Never done  . PAP SMEAR-Modifier  Never done  . COLONOSCOPY  Never done    There are no preventive care reminders to display for this patient.  No results found for: TSH Lab Results  Component Value Date   WBC 5.1 01/24/2014   HGB 13.1 01/24/2014   HCT 40.0 01/24/2014   MCV 87 01/24/2014   PLT 272 01/24/2014   Lab Results  Component Value Date   NA 138 01/24/2014   K 4.0 01/24/2014   CO2 26 01/24/2014   GLUCOSE 96 01/24/2014   BUN 13 01/24/2014   CREATININE 1.05 01/24/2014   BILITOT 0.5 01/24/2014   ALKPHOS 74 01/24/2014   AST 22 01/24/2014   ALT 30 01/24/2014   PROT 7.9 01/24/2014   ALBUMIN 4.2 01/24/2014   CALCIUM 8.9 01/24/2014   ANIONGAP 8 01/24/2014   No results found for: CHOL No results found for: HDL No results found for: LDLCALC No results found for: TRIG No results found for: CHOLHDL No results found for: HGBA1C    Assessment & Plan:   Problem List Items Addressed This Visit      Cardiovascular and  Mediastinum   Essential hypertension    Stable.        Nervous and Auditory   Excessive cerumen in both ear canals    We will wash both ear as  soon as possible.        Musculoskeletal and Integument   Rheumatoid arthritis [facility])    Patient see Dr. Gavin Potters for that.      Relevant Medications   methadone (DOLOPHINE) 10 MG tablet   Lumbar degenerative disc disease    Chronic problem.      Relevant Medications   methadone (DOLOPHINE) 10 MG tablet     Other   Chronic pain syndrome - Primary   Relevant Medications   methadone (DOLOPHINE) 10 MG tablet      Meds ordered this encounter  Medications  . methadone (DOLOPHINE) 10 MG tablet    Sig: Take 0.5 tablets (5 mg total) by mouth in the morning, at noon, and at bedtime. Pt is reducing this medication    Dispense:  90 tablet    Refill:  0    Follow-up: Return in about 3 months (around 02/18/2020).    Corky Downs, MD

## 2019-11-18 NOTE — Assessment & Plan Note (Signed)
Stable

## 2019-11-18 NOTE — Assessment & Plan Note (Signed)
We will wash both ear as soon as possible.

## 2019-12-27 ENCOUNTER — Encounter (HOSPITAL_COMMUNITY): Payer: Self-pay | Admitting: Psychiatry

## 2019-12-28 ENCOUNTER — Encounter (HOSPITAL_COMMUNITY): Payer: Self-pay | Admitting: Psychiatry

## 2019-12-28 ENCOUNTER — Telehealth (INDEPENDENT_AMBULATORY_CARE_PROVIDER_SITE_OTHER): Payer: 59 | Admitting: Psychiatry

## 2019-12-28 ENCOUNTER — Other Ambulatory Visit: Payer: Self-pay

## 2019-12-28 DIAGNOSIS — F419 Anxiety disorder, unspecified: Secondary | ICD-10-CM

## 2019-12-28 NOTE — Progress Notes (Signed)
Psychiatric Initial Adult Assessment    Patient Identification: Natasha Gonzales MRN:  [id number] Date of Evaluation:  12/28/2019 Referral Source: Edward Jolly MD Chief Complaint:  Chronic pain, some anxiety, insomnia.  Interview was conducted using videoconferencing application and I verified that I was speaking with the correct person using two identifiers. I discussed the limitations of evaluation and management by telemedicine and  the availability of in person appointments. Patient expressed understanding and agreed to proceed. Patient location - home; physician - home office.  Visit Diagnosis:    ICD-10-CM   1. Anxiety  F41.9     History of Present Illness:  Natasha Gonzales is a pleasant 64 yo divorced female referred for psychiatric evaluation prior to starting pain management with Dr. Cherylann Ratel. Besides chronic pain syndrome Natasha Gonzales's medical diagnoses include rheumatoid arthritis,  Degenerative disc disease (lmbar region), sacroiliac joint pain and hypertension. She has been treated for pain since 2000 - she is currently on methadone 5 mg tid prescribed by Dr. Juel Burrow. She earlier tried other medications such as gabapentin, duloxetine, oxycodone. She also tried OT/PT. Her ultimate goal is to eventually come of narcotic analgesics but she acknowledges that this is not possible at this time.  Natasha Gonzales has a hx of treatment for depression about 20 years ago including a single voluntary psychiatric admnission to [facility] in 1995. At that time she found out that her husband was having an affair with her best friend. She was taking antidepressant medication then (does not recall which one) and ultimately got a divorce. She denies ever having suicidal or homicidal thoughts, having hallucinations/delusions or mania. She has no hx of alcohol or drug abuse. She has not been under care of psychiatrist in over 15 years. She admits to stuggles with insomnia which is triggered by anxiety about health of her 63 yo mother.  Mother has had few falls recently and Willona often wakes up worrying that she will receive a phone call from her mother requesting help. She was briefly prescribed low dose lorazepam last year for this.  Associated Signs/Symptoms: Depression Symptoms:  fatigue, anxiety, disturbed sleep, (Hypo) Manic Symptoms:  None Anxiety Symptoms:  Excessive Worry, Psychotic Symptoms:  None PTSD Symptoms: Negative  Past Psychiatric History: See above.  Previous Psychotropic Medications: Yes   Substance Abuse History in the last 12 months:  No.  Consequences of Substance Abuse: NA  Past Medical History:  Past Medical History:  Diagnosis Date  . Anxiety   . Depression   . Generalized headaches   . Hyperlipidemia   . Hypertension   . Ovarian cyst   . Rheumatoid arthritis [facility])     Past Surgical History:  Procedure Laterality Date  . ABDOMINAL HYSTERECTOMY  1993   total  . adhesion  2001   laprascopic  . JOINT REPLACEMENT    . OVARIAN CYST REMOVAL    . TONSILECTOMY/ADENOIDECTOMY WITH MYRINGOTOMY  1984    Family Psychiatric History: None known.  Family History:  Family History  Problem Relation Age of Onset  . Cancer Brother   . Cancer Other   . Hypertension Mother   . Heart disease Mother   . Kidney disease Mother   . Heart disease Father   . Breast cancer Neg Hx     Social History:   Social History   Socioeconomic History  . Marital status: Divorced    Spouse name: Not on file  . Number of children: 3  . Years of education: Not on file  . Highest education  level: Not on file  Occupational History  . Not on file  Tobacco Use  . Smoking status: Current Every Day Smoker    Years: 25.00    Types: Cigarettes  . Smokeless tobacco: Never Used  . Tobacco comment: Stress smoker. 1 cig every now and then  Substance and Sexual Activity  . Alcohol use: Not Currently  . Drug use: Never  . Sexual activity: Not on file  Other Topics Concern  . Not on file  Social History  Narrative  . Not on file   Social Determinants of Health   Financial Resource Strain:   . Difficulty of Paying Living Expenses:   Food Insecurity:   . Worried About Programme researcher, broadcasting/film/video in the Last Year:   . Barista in the Last Year:   Transportation Needs:   . Freight forwarder (Medical):   Marland Kitchen Lack of Transportation (Non-Medical):   Physical Activity:   . Days of Exercise per Week:   . Minutes of Exercise per Session:   Stress:   . Feeling of Stress :   Social Connections:   . Frequency of Communication with Friends and Family:   . Frequency of Social Gatherings with Friends and Family:   . Attends Religious Services:   . Active Member of Clubs or Organizations:   . Attends Banker Meetings:   Marland Kitchen Marital Status:     Additional Social History: Mekaila has college degree in science and works as a Energy manager for [employer]. She lives in [place] with her oldest son Jeannett Senior (37).  Allergies:  No Known Allergies  Metabolic Disorder Labs: No results found for: HGBA1C, MPG No results found for: PROLACTIN No results found for: CHOL, TRIG, HDL, CHOLHDL, VLDL, LDLCALC No results found for: TSH  Therapeutic Level Labs: No results found for: LITHIUM No results found for: CBMZ No results found for: VALPROATE  Current Medications: Current Outpatient Medications  Medication Sig Dispense Refill  . aspirin EC 81 MG tablet Take 81 mg by mouth daily.    . fluticasone (FLONASE) 50 MCG/ACT nasal spray Place into both nostrils daily. As needed    . hydroxychloroquine (PLAQUENIL) 200 MG tablet Take 200 mg by mouth daily. 2 tablets    . ibuprofen (ADVIL,MOTRIN) 800 MG tablet Take 800 mg by mouth every 6 (six) hours as needed.    Marland Kitchen lisinopril (PRINIVIL,ZESTRIL) 5 MG tablet Take 20 mg by mouth daily.     . methadone (DOLOPHINE) 10 MG tablet Take 0.5 tablets (5 mg total) by mouth in the morning, at noon, and at bedtime. Pt is reducing this medication 90 tablet 0  .  Multiple Vitamin (MULTIVITAMIN) capsule Take 1 capsule by mouth daily.    . rosuvastatin (CRESTOR) 40 MG tablet Take 40 mg by mouth daily.     No current facility-administered medications for this visit.    Psychiatric Specialty Exam: Review of Systems  Constitutional: Positive for fatigue.  Musculoskeletal: Positive for arthralgias and back pain.  Psychiatric/Behavioral: Positive for sleep disturbance. The patient is nervous/anxious.   All other systems reviewed and are negative.   There were no vitals taken for this visit.There is no height or weight on file to calculate BMI.  General Appearance: Casual and Well Groomed  Eye Contact:  Good  Speech:  Clear and Coherent and Normal Rate  Volume:  Normal  Mood:  Anxious at times.  Affect:  Full Range  Thought Process:  Goal Directed and Linear  Orientation:  Full (Time, Place, and Person)  Thought Content:  Logical  Suicidal Thoughts:  No  Homicidal Thoughts:  No  Memory:  Immediate;   Good Recent;   Good Remote;   Good  Judgement:  Good  Insight:  Good  Psychomotor Activity:  Normal  Concentration:  Concentration: Good  Recall:  Good  Fund of Knowledge:Good  Language: Good  Akathisia:  Negative  Handed:  Right  AIMS (if indicated):  not done  Assets:  Communication Skills Desire for Improvement Financial Resources/Insurance Housing Resilience Social Support Vocational/Educational  ADL's:  Intact  Cognition: WNL  Sleep:  Fair   Assessment and Plan: 64 yo divorced female referred for psychiatric evaluation prior to starting pain management with Dr. Cherylann Ratel. Besides chronic pain syndrome Jaylise's medical diagnoses include rheumatoid arthritis,  Degenerative disc disease (lmbar region), sacroiliac joint pain and hypertension. She has been treated for pain since 2000 - she is currently on methadone 5 mg tid prescribed by Dr. Juel Burrow. She earlier tried other medications such as gabapentin, duloxetine, oxycodone. She also tried  OT/PT. Her ultimate goal is to eventually come of narcotic analgesics but she acknowledges that this is not possible at this time.  Armanii has a hx of treatment for depression about 20 years ago including a single voluntary psychiatric admnission to [facility] in 1995. At that time she found out that her husband was having an affair with her best friend. She was taking antidepressant medication then (does not recall which one) and ultimately got a divorce. She denies ever having suicidal or homicidal thoughts, having hallucinations/delusions or mania. She has no hx of alcohol or drug abuse. She has not been under care of psychiatrist in over 15 years. She admits to stuggles with insomnia which is triggered by anxiety about health of her 43 yo mother. Mother has had few falls recently and Kassondra often wakes up worrying that she will receive a phone call from her mother requesting help. She was briefly prescribed low dose lorazepam last year for this.  No current symptoms which would suggest ongoing depression - insomnia is secondary to anxiety, fatigue - to chronic pani/medication adverse effects.   Dx (psychiatric): Anxiety disorder unspecified  Patient has completed several screening questionnaires: PHQ-9, GAD-7, AUDIT, DAST, SOAPP and Opioid Risk Tool which are consistent with her having some anxiety (situational) and no alcohol or drug abuse problems. Her opioid risk category is low. She denies having any hx of overusing opioid analgesics or seeking prescriptions from different providers.              In summary, the patient does not have any current mental health issues which would require psychiatric care. She does not appear to have any alcohol/substance abuse issues either and can be considered as low risk for abuse of controlled substances should these be recommended/prescribed to him. I see no contraindications from psychiatric standpoint to starting pain management treatment as recommended by Dr. Cherylann Ratel.  These conclusions were discussed with patient who had an opportunity to ask questions and these were all answered. I spend 45 minutes in videoconferencing with the patient.             I would like to thank Dr. Cherylann Ratel for referring this pleasant lady to [place] for evaluation.  Magdalene Patricia, MD 7/7/20212:24 PM

## 2020-01-02 ENCOUNTER — Ambulatory Visit (INDEPENDENT_AMBULATORY_CARE_PROVIDER_SITE_OTHER): Payer: Managed Care, Other (non HMO) | Admitting: Internal Medicine

## 2020-01-02 ENCOUNTER — Other Ambulatory Visit: Payer: Self-pay

## 2020-01-02 ENCOUNTER — Encounter: Payer: Self-pay | Admitting: Internal Medicine

## 2020-01-02 VITALS — BP 158/74 | HR 90 | Ht 68.0 in | Wt 199.5 lb

## 2020-01-02 DIAGNOSIS — E785 Hyperlipidemia, unspecified: Secondary | ICD-10-CM | POA: Insufficient documentation

## 2020-01-02 DIAGNOSIS — H6123 Impacted cerumen, bilateral: Secondary | ICD-10-CM | POA: Diagnosis not present

## 2020-01-02 DIAGNOSIS — E782 Mixed hyperlipidemia: Secondary | ICD-10-CM

## 2020-01-02 DIAGNOSIS — Z720 Tobacco use: Secondary | ICD-10-CM

## 2020-01-02 DIAGNOSIS — I10 Essential (primary) hypertension: Secondary | ICD-10-CM | POA: Diagnosis not present

## 2020-01-02 DIAGNOSIS — M0579 Rheumatoid arthritis with rheumatoid factor of multiple sites without organ or systems involvement: Secondary | ICD-10-CM

## 2020-01-02 DIAGNOSIS — F419 Anxiety disorder, unspecified: Secondary | ICD-10-CM

## 2020-01-02 DIAGNOSIS — G894 Chronic pain syndrome: Secondary | ICD-10-CM | POA: Diagnosis not present

## 2020-01-02 MED ORDER — METHADONE HCL 10 MG PO TABS: 5.0000 mg | ORAL_TABLET | Freq: Three times a day (TID) | ORAL | 0 refills | Status: DC

## 2020-01-02 NOTE — Assessment & Plan Note (Signed)
On Crestor , lipid  Test  due

## 2020-01-02 NOTE — Assessment & Plan Note (Signed)
Pt  Is on  Methadone  5 mg po tid

## 2020-01-02 NOTE — Progress Notes (Addendum)
Established Patient Office Visit  SUBJECTIVE:  Subjective  Patient ID: Natasha Gonzales, female    DOB: 06/26/1955  Age: 64 y.o. MRN: [id number]  CC:  Chief Complaint  Patient presents with  . Back Pain    patient needs refill of methadone     HPI Natasha Gonzales is a 64 y.o. female presenting today for a refill for her back pain medication, methadone.   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 Her blood pressure today is slightly elevated at 158/74. She notes that she just got out of a class prior to her visit today. She also notes some recent stress involving her pet. She has been taking lisinopril, without any difficulty.   She notes that it has been about a year since she last had a lipid test, so it is about time for her to have another test done.   Her back pain is stable. She continues to have pain, but it is controlled with methadone 15mg . She has an appointment to transfer her pain management care to Dr. next month.   Past Medical History:  Diagnosis Date  . Anxiety   . Depression   . Generalized headaches   . Hyperlipidemia   . Hypertension   . Ovarian cyst   . Rheumatoid arthritis [facility])     Past Surgical History:  Procedure Laterality Date  . ABDOMINAL HYSTERECTOMY  1993   total  . adhesion  2001   laprascopic  . JOINT REPLACEMENT    . OVARIAN CYST REMOVAL    . TONSILECTOMY/ADENOIDECTOMY WITH MYRINGOTOMY  1984    Family History  Problem Relation Age of Onset  . Cancer Brother   . Cancer Other   . Hypertension Mother   . Heart disease Mother   . Kidney disease Mother   . Heart disease Father   . Breast cancer Neg Hx     Social History   Socioeconomic History  . Marital status: Divorced    Spouse name: Not on file  . Number of children: 3  . Years of education: Not on file  . Highest education level: Not on file  Occupational History  . Not on file  Tobacco Use  . Smoking status: Current Every Day Smoker    Years:  25.00    Types: Cigarettes  . Smokeless tobacco: Never Used  . Tobacco comment: Stress smoker. 1 cig every now and then  Substance and Sexual Activity  . Alcohol use: Not Currently  . Drug use: Never  . Sexual activity: Not on file  Other Topics Concern  . Not on file  Social History Narrative  . Not on file   Social Determinants of Health   Financial Resource Strain:   . Difficulty of Paying Living Expenses:   Food Insecurity:   . Worried About 1985 in the Last Year:   . Programme researcher, broadcasting/film/video in the Last Year:   Transportation Needs:   . Barista (Medical):   Freight forwarder Lack of Transportation (Non-Medical):   Physical Activity:   . Days of Exercise per Week:   . Minutes of Exercise per Session:   Stress:   . Feeling of Stress :   Social Connections:   . Frequency of Communication with Friends and Family:   . Frequency of Social Gatherings with Friends and Family:   . Attends Religious Services:   . Active Member of Clubs or Organizations:   . Attends Club  or Organization Meetings:   Marland Kitchen Marital Status:   Intimate Partner Violence:   . Fear of Current or Ex-Partner:   . Emotionally Abused:   Marland Kitchen Physically Abused:   . Sexually Abused:      Current Outpatient Medications:  .  aspirin EC 81 MG tablet, Take 81 mg by mouth daily., Disp: , Rfl:  .  fluticasone (FLONASE) 50 MCG/ACT nasal spray, Place into both nostrils daily. As needed, Disp: , Rfl:  .  hydroxychloroquine (PLAQUENIL) 200 MG tablet, Take 200 mg by mouth daily. 2 tablets, Disp: , Rfl:  .  ibuprofen (ADVIL,MOTRIN) 800 MG tablet, Take 800 mg by mouth every 6 (six) hours as needed., Disp: , Rfl:  .  lisinopril (PRINIVIL,ZESTRIL) 5 MG tablet, Take 20 mg by mouth daily. , Disp: , Rfl:  .  methadone (DOLOPHINE) 10 MG tablet, Take 0.5 tablets (5 mg total) by mouth in the morning, at noon, and at bedtime. Pt is reducing this medication, Disp: 90 tablet, Rfl: 0 .  Multiple Vitamin (MULTIVITAMIN) capsule,  Take 1 capsule by mouth daily., Disp: , Rfl:  .  rosuvastatin (CRESTOR) 40 MG tablet, Take 40 mg by mouth daily., Disp: , Rfl:  .  lisinopril (ZESTRIL) 20 MG tablet, TAKE 1 TABLET BY MOUTH  DAILY, Disp: 90 tablet, Rfl: 3   No Known Allergies  ROS Review of Systems  Constitutional: Negative.   HENT: Negative.   Eyes: Negative.   Respiratory: Negative.   Cardiovascular: Negative.   Gastrointestinal: Negative.   Endocrine: Negative.   Genitourinary: Negative.   Musculoskeletal: Positive for arthralgias (rhumatoid).  Skin: Negative.   Allergic/Immunologic: Negative.   Neurological: Negative.   Hematological: Negative.   Psychiatric/Behavioral: The patient is nervous/anxious (recent social stress).   All other systems reviewed and are negative.    OBJECTIVE:    Physical Exam Vitals reviewed.  Constitutional:      Appearance: Normal appearance.  HENT:     Mouth/Throat:     Mouth: Mucous membranes are moist.  Eyes:     Pupils: Pupils are equal, round, and reactive to light.  Cardiovascular:     Rate and Rhythm: Normal rate and regular rhythm.     Pulses: Normal pulses.     Heart sounds: Normal heart sounds.  Pulmonary:     Effort: Pulmonary effort is normal.     Breath sounds: Normal breath sounds.  Abdominal:     Palpations: There is no hepatomegaly, splenomegaly or mass.     Tenderness: There is no abdominal tenderness.  Musculoskeletal:     Right lower leg: No edema.     Left lower leg: No edema.  Neurological:     Mental Status: She is alert and oriented to person, place, and time.  Psychiatric:        Mood and Affect: Mood and affect normal.        Behavior: Behavior normal.     BP (!) 158/74   Pulse 90   Ht 5\' 8"  (1.727 m)   Wt 199 lb 8 oz (90.5 kg)   BMI 30.33 kg/m  Wt Readings from Last 3 Encounters:  01/02/20 199 lb 8 oz (90.5 kg)  11/18/19 203 lb 8 oz (92.3 kg)  11/08/19 200 lb (90.7 kg)    Health Maintenance Due  Topic Date Due  . Hepatitis C  Screening  Never done  . COVID-19 Vaccine (1) Never done  . HIV Screening  Never done  . TETANUS/TDAP  Never done  .  PAP SMEAR-Modifier  Never done  . COLONOSCOPY  Never done    There are no preventive care reminders to display for this patient.  CBC Latest Ref Rng & Units 01/24/2014  WBC 3.6 - 11.0 x10 3/mm 3 5.1  Hemoglobin 12.0 - 16.0 g/dL 66.0  Hematocrit 60.0 - 47.0 % 40.0  Platelets 150 - 440 x10 3/mm 3 272   CMP Latest Ref Rng & Units 01/24/2014  Glucose 65 - 99 mg/dL 96  BUN 7 - 18 mg/dL 13  Creatinine 4.59 - 9.77 mg/dL 4.14  Sodium 239 - 532 mmol/L 138  Potassium 3.5 - 5.1 mmol/L 4.0  Chloride 98 - 107 mmol/L 104  CO2 21 - 32 mmol/L 26  Calcium 8.5 - 10.1 mg/dL 8.9  Total Protein 6.4 - 8.2 g/dL 7.9  Total Bilirubin 0.2 - 1.0 mg/dL 0.5  Alkaline Phos Unit/L 74  AST 15 - 37 Unit/L 22  ALT U/L 30    No results found for: TSH Lab Results  Component Value Date   ALBUMIN 4.2 01/24/2014   ANIONGAP 8 01/24/2014   No results found for: CHOL, HDL, LDLCALC, CHOLHDL No results found for: TRIG No results found for: HGBA1C    ASSESSMENT & PLAN:   Problem List Items Addressed This Visit      Cardiovascular and Mediastinum   Essential hypertension    - Today, the patient's blood pressure is not well managed on lisinopril 20 mg po daily. - The patient will continue the current treatment regimen.  - I encouraged the patient to eat a low-sodium diet to help control blood pressure. - I encouraged the patient to live an active lifestyle and complete activities that increases heart rate to 85% target heart rate at least 5 times per week for one hour.            Nervous and Auditory   Excessive cerumen in both ear canals    Ears are clean   now        Musculoskeletal and Integument   Rheumatoid arthritis (HCC)    Pt is on plaquanil   By  Dr Gavin Potters      Relevant Medications   methadone (DOLOPHINE) 10 MG tablet     Other   Anxiety   Chronic pain syndrome -  Primary    Pt  Is on  Methadone  5 mg po tid      Relevant Medications   methadone (DOLOPHINE) 10 MG tablet   Hyperlipidemia    On Crestor , lipid  Test  due      Tobacco abuse    - I instructed the patient to stop smoking and provided them with smoking cessation materials.  - I informed the patient that smoking puts them at increased risk for cancer, COPD, hypertension, and more.  - Informed the patient to seek help if they begin to have trouble breathing, develop chest pain, start to cough up blood, feel faint, or pass out.           Meds ordered this encounter  Medications  . methadone (DOLOPHINE) 10 MG tablet    Sig: Take 0.5 tablets (5 mg total) by mouth in the morning, at noon, and at bedtime. Pt is reducing this medication    Dispense:  90 tablet    Refill:  0       Follow-up: Return in about 3 months (around 04/03/2020).    Dr. Woodroe Chen [facility] [address], [place],  [place] [address]   By signing my name below, I, [employer], attest that this documentation has been prepared under the direction and in the presence of Corky Downs, MD. Electronically Signed: Corky Downs, MD 01/20/20, 4:34 PM    I personally performed the services described in this documentation, which was SCRIBED in my presence. The recorded information has been reviewed and considered accurate. It has been edited as necessary during review. Corky Downs, MD

## 2020-01-02 NOTE — Assessment & Plan Note (Signed)
Pt is on plaquanil   By  Dr Gavin Potters

## 2020-01-02 NOTE — Assessment & Plan Note (Signed)
-   Today, the patient's blood pressure is not well managed on lisinopril 20 mg po daily. - The patient will continue the current treatment regimen.  - I encouraged the patient to eat a low-sodium diet to help control blood pressure. - I encouraged the patient to live an active lifestyle and complete activities that increases heart rate to 85% target heart rate at least 5 times per week for one hour.

## 2020-01-02 NOTE — Assessment & Plan Note (Signed)
Ears are clean   now

## 2020-01-17 ENCOUNTER — Other Ambulatory Visit: Payer: Self-pay | Admitting: Internal Medicine

## 2020-01-19 ENCOUNTER — Ambulatory Visit: Payer: Self-pay | Admitting: Student in an Organized Health Care Education/Training Program

## 2020-01-20 DIAGNOSIS — Z720 Tobacco use: Secondary | ICD-10-CM | POA: Insufficient documentation

## 2020-01-20 NOTE — Assessment & Plan Note (Signed)
-   I instructed the patient to stop smoking and provided them with smoking cessation materials.  - I informed the patient that smoking puts them at increased risk for cancer, COPD, hypertension, and more.  - Informed the patient to seek help if they begin to have trouble breathing, develop chest pain, start to cough up blood, feel faint, or pass out.  

## 2020-01-31 ENCOUNTER — Ambulatory Visit
Payer: Managed Care, Other (non HMO) | Attending: Student in an Organized Health Care Education/Training Program | Admitting: Student in an Organized Health Care Education/Training Program

## 2020-01-31 ENCOUNTER — Other Ambulatory Visit: Payer: Self-pay

## 2020-01-31 ENCOUNTER — Encounter: Payer: Self-pay | Admitting: Student in an Organized Health Care Education/Training Program

## 2020-01-31 VITALS — BP 146/65 | HR 97 | Temp 97.2°F | Resp 16 | Ht 62.0 in | Wt 197.0 lb

## 2020-01-31 DIAGNOSIS — G894 Chronic pain syndrome: Secondary | ICD-10-CM | POA: Insufficient documentation

## 2020-01-31 DIAGNOSIS — M533 Sacrococcygeal disorders, not elsewhere classified: Secondary | ICD-10-CM | POA: Insufficient documentation

## 2020-01-31 DIAGNOSIS — Z79891 Long term (current) use of opiate analgesic: Secondary | ICD-10-CM | POA: Diagnosis present

## 2020-01-31 DIAGNOSIS — M5136 Other intervertebral disc degeneration, lumbar region: Secondary | ICD-10-CM | POA: Diagnosis present

## 2020-01-31 DIAGNOSIS — Z0289 Encounter for other administrative examinations: Secondary | ICD-10-CM | POA: Insufficient documentation

## 2020-01-31 DIAGNOSIS — M069 Rheumatoid arthritis, unspecified: Secondary | ICD-10-CM | POA: Diagnosis present

## 2020-01-31 MED ORDER — METHADONE HCL 5 MG PO TABS: 5.0000 mg | ORAL_TABLET | Freq: Three times a day (TID) | ORAL | 0 refills | Status: DC

## 2020-01-31 MED ORDER — METHADONE HCL 5 MG PO TABS: 5.0000 mg | ORAL_TABLET | Freq: Three times a day (TID) | ORAL | 0 refills | Status: AC

## 2020-01-31 NOTE — Progress Notes (Signed)
Safety precautions to be maintained throughout the outpatient stay will include: orient to surroundings, keep bed in low position, maintain call bell within reach at all times, provide assistance with transfer out of bed and ambulation.  

## 2020-01-31 NOTE — Progress Notes (Signed)
PROVIDER NOTE: Information contained herein reflects review and annotations entered in association with encounter. Interpretation of such information and data should be left to medically-trained personnel. Information provided to patient can be located elsewhere in the medical record under "Patient Instructions". Document created using STT-dictation technology, any transcriptional errors that may result from process are unintentional.    Patient: Natasha Gonzales  Service Category: E/M  Provider: Gillis Santa, MD  DOB: [birth year]  DOS: 01/31/2020  Specialty: Interventional Pain Management  MRN: [id number]  Setting: Ambulatory outpatient  PCP: Natasha Athens, MD  Type: Established Patient    Referring Provider: Cletis Athens, MD  Location: Office  Delivery: Face-to-face     HPI  Reason for encounter: [name], a 64 y.o. year old female, is here today for evaluation and management of her Chronic pain syndrome [G89.4]. [name] primary complain today is Back Pain and Leg Pain Last encounter: Practice (11/08/2019). My last encounter with her was on 11/08/2019. Pertinent problems: [name] does not have any pertinent problems on file. Pain Assessment: Severity of Chronic pain is reported as a 4 /10. Location: Back Lower/around to groin area and down inside of both legs to bottom of feet bilat. Onset: More than a month ago. Quality: Sharp, Aching (hot). Timing: Constant. Modifying factor(s): heat, TENS, ease. Vitals:  height is _0  (1.575 m) and weight is 197 lb (89.4 kg). Her temporal temperature is 97.2 F (36.2 C) (abnormal). Her blood pressure is 146/65 (abnormal) and her pulse is 97. Her respiration is 16 and oxygen saturation is 97%.   Second patient visit, has been evaluated by psychiatry.  UDS below is appropriate.  We will take over patient's methadone as below and have her sign pain contract with our pain clinic.  From initial clinic note: 11/08/2019 "Patient is a pleasant 64 year old  female who presents with a chief complaint of right groin pain that radiates to her lower back and also in the medial portion of her legs down to her feet.  Patient has a history of adhesions from hysterectomy and cystoscopy.  She is being referred from Dr. Rebecka Gonzales for chronic pain management by way of methadone management.  Patient has been on methadone for many years.  She has reduced her dose down over the last couple of years and she is currently taking 15 mg daily, 5 mg 3 times daily as needed.  She denies any foot numbness leg weakness urinary incontinence.  She does have a history of lumbar degenerative joint disease.  Patient also utilizes complementary and integrative pain management strategies including TENS unit, massage therapy, acupuncture, physical therapy.  I commended her on this.  She is interested in having her methadone managed by the pain clinic as Dr. Rebecka Gonzales is going to be retiring soon.  Of note she has tried Neurontin in the past which was not very helpful."  Pharmacotherapy Assessment    01/02/2020  2   01/02/2020  Methadone Hcl 10 MG Tablet  45.00  30 Natasha Gonzales   [id number]   Thr (3419)   0/0  45.00 MME  Private Pay   [place]     Monitoring: [place] PMP: PDMP not reviewed this encounter.       Pharmacotherapy: No side-effects or adverse reactions reported. Compliance: No problems identified. Effectiveness: Clinically acceptable.  Natasha Gonzales, [place], RN  01/31/2020  8:42 AM  Sign when Signing Visit Safety precautions to be maintained throughout the outpatient stay will include: orient to surroundings, keep bed in low  position, maintain call bell within reach at all times, provide assistance with transfer out of bed and ambulation.     UDS:  Summary  Date Value Ref Range Status  11/08/2019 Note  Final    Comment:    ==================================================================== Compliance Drug Analysis, Ur ==================================================================== Test                              Result       Flag       Units Drug Present   Methadone                      2414                    ng/mg creat   EDDP (Methadone Mtb)           1698                    ng/mg creat    Sources of methadone include scheduled prescription medications.    EDDP is an expected metabolite of methadone.   Acetaminophen                  PRESENT ==================================================================== Test                      Result    Flag   Units      Ref Range   Creatinine              189              mg/dL      >=20 ==================================================================== Declared Medications:  Medication list was not provided. ==================================================================== For clinical consultation, please call [phone]. ====================================================================      ROS  Constitutional: Denies any fever or chills Gastrointestinal: No reported hemesis, hematochezia, vomiting, or acute GI distress Musculoskeletal: Arthralgias, myalgias Neurological: No reported episodes of acute onset apraxia, aphasia, dysarthria, agnosia, amnesia, paralysis, loss of coordination, or loss of consciousness  Medication Review  aspirin EC, fluticasone, hydroxychloroquine, ibuprofen, lisinopril, methadone, multivitamin, pseudoephedrine, and rosuvastatin  History Review  Allergy: [name] has No Known Allergies. Drug: [name]  reports no history of drug use. Alcohol:  reports previous alcohol use. Tobacco:  reports that she has been smoking cigarettes. She has smoked for the past 25.00 years. She has never used smokeless tobacco. Social: Natasha Gonzales  reports that she has been smoking cigarettes. She has smoked for the past 25.00 years. She has never used smokeless tobacco. She reports previous alcohol use. She reports that she does not use drugs. Medical:  has a past medical history of Anxiety,  Depression, Generalized headaches, Hyperlipidemia, Hypertension, Ovarian cyst, and Rheumatoid arthritis ([place]). Surgical: [name]  has a past surgical history that includes Tonsilectomy/adenoidectomy with myringotomy (1984); Abdominal hysterectomy (1993); adhesion (2001); Ovarian cyst removal; and Joint replacement. Family: family history includes Cancer in her brother and another family member; Heart disease in her father and mother; Hypertension in her mother; Kidney disease in her mother.  Laboratory Chemistry Profile   Renal Lab Results  Component Value Date   BUN 13 01/24/2014   CREATININE 1.05 01/24/2014   GFRAA >60 01/24/2014   GFRNONAA 59 (L) 01/24/2014     Hepatic Lab Results  Component Value Date   AST 22 01/24/2014   ALT 30 01/24/2014   ALBUMIN 4.2 01/24/2014   ALKPHOS  74 01/24/2014   LIPASE 129 01/24/2014     Electrolytes Lab Results  Component Value Date   NA 138 01/24/2014   K 4.0 01/24/2014   CL 104 01/24/2014   CALCIUM 8.9 01/24/2014     Bone No results found for: VD25OH, VD125OH2TOT, BX0383FX8, VA9191YO0, 25OHVITD1, 25OHVITD2, 25OHVITD3, TESTOFREE, TESTOSTERONE   Inflammation (CRP: Acute Phase) (ESR: Chronic Phase) No results found for: CRP, ESRSEDRATE, LATICACIDVEN     Note: Above Lab results reviewed.  Recent Imaging Review  MM 3D SCREEN BREAST BILATERAL CLINICAL DATA:  Screening.  EXAM: DIGITAL SCREENING BILATERAL MAMMOGRAM WITH TOMO AND CAD  COMPARISON:  Previous exam(s).  ACR Breast Density Category b: There are scattered areas of fibroglandular density.  FINDINGS: There are no findings suspicious for malignancy. Images were processed with CAD.  IMPRESSION: No mammographic evidence of malignancy. A result letter of this screening mammogram will be mailed directly to the patient.  RECOMMENDATION: Screening mammogram in one year. (Code:SM-B-01Y)  BI-RADS CATEGORY  1: Negative.  Electronically Signed   By: Margarette Canada M.D.   On:  04/05/2019 14:47 Note: Reviewed        Physical Exam  General appearance: Well nourished, well developed, and well hydrated. In no apparent acute distress Mental status: Alert, oriented x 3 (person, place, & time)       Respiratory: No evidence of acute respiratory distress Eyes: PERLA Vitals: BP (!) 146/65   Pulse 97   Temp (!) 97.2 F (36.2 C) (Temporal)   Resp 16   Ht _0  (1.575 m)   Wt 197 lb (89.4 kg)   SpO2 97%   BMI 36.03 kg/m  BMI: Estimated body mass index is 36.03 kg/m as calculated from the following:   Height as of this encounter: _1  (1.575 m).   Weight as of this encounter: 197 lb (89.4 kg). Ideal: Ideal body weight: 50.1 kg (110 lb 7.2 oz) Adjusted ideal body weight: 65.8 kg (145 lb 1.1 oz)  Thoracic Spine Area Exam  Skin & Axial Inspection: No masses, redness, or swelling Alignment: Symmetrical Functional ROM: Unrestricted ROM Stability: No instability detected Muscle Tone/Strength: Functionally intact. No obvious neuro-muscular anomalies detected. Sensory (Neurological): Unimpaired Muscle strength & Tone: No palpable anomalies  Lumbar Exam  Skin & Axial Inspection: No masses, redness, or swelling Alignment: Symmetrical Functional ROM: Unrestricted ROM       Stability: No instability detected Muscle Tone/Strength: Functionally intact. No obvious neuro-muscular anomalies detected. Sensory (Neurological): Musculoskeletal pain pattern Palpation: No palpable anomalies       Provocative Tests: Hyperextension/rotation test: deferred today       Lumbar quadrant test (Kemp's test): deferred today       Lateral bending test: deferred today       Patrick's Maneuver: deferred today                   FABER* test: deferred today                   S-I anterior distraction/compression test: deferred today         S-I lateral compression test: deferred today         S-I Thigh-thrust test: deferred today         S-I Gaenslen's test: deferred today          *(Flexion, ABduction and External Rotation)  Gait & Posture Assessment  Ambulation: Unassisted Gait: Relatively normal for age and body habitus Posture: WNL   Lower Extremity Exam  Side: Right lower extremity  Side: Left lower extremity  Stability: No instability observed          Stability: No instability observed          Skin & Extremity Inspection: Skin color, temperature, and hair growth are WNL. No peripheral edema or cyanosis. No masses, redness, swelling, asymmetry, or associated skin lesions. No contractures.  Skin & Extremity Inspection: Skin color, temperature, and hair growth are WNL. No peripheral edema or cyanosis. No masses, redness, swelling, asymmetry, or associated skin lesions. No contractures.  Functional ROM: Unrestricted ROM                  Functional ROM: Unrestricted ROM                  Muscle Tone/Strength: Functionally intact. No obvious neuro-muscular anomalies detected.  Muscle Tone/Strength: Functionally intact. No obvious neuro-muscular anomalies detected.  Sensory (Neurological): Musculoskeletal pain pattern        Sensory (Neurological): Musculoskeletal pain pattern        DTR: Patellar: deferred today Achilles: deferred today Plantar: deferred today  DTR: Patellar: deferred today Achilles: deferred today Plantar: deferred today  Palpation: No palpable anomalies  Palpation: No palpable anomalies     Assessment   Status Diagnosis  Controlled Controlled Controlled 1. Chronic pain syndrome   2. Rheumatoid arthritis involving multiple sites, unspecified whether rheumatoid factor present ([place])   3. Lumbar degenerative disc disease   4. Sacroiliac joint pain   5. Encounter for long-term methadone use for pain control   6. Pain management contract signed      Updated Problems: Problem  Encounter for Long-Term Methadone Use for Pain Control   5 mg 3 times daily pain contract signed 01/31/2020   Pain Management Contract Signed    01/31/2020.  UDS up-to-date and appropriate.  Has been evaluated by psychiatry, low risk for substance abuse disorder.  Has been on methadone long-term, being transferred from Dr. Rebecka Gonzales.  Continue at 5 mg 3 times daily as needed.  Recommend EKG to evaluate QTC every 12 months.   Rheumatoid Arthritis Involving Multiple Sites ([facility])    Plan of Care  [name] has a current medication list which includes the following long-term medication(s): fluticasone, lisinopril, lisinopril, and rosuvastatin.  Pharmacotherapy (Medications Ordered): Meds ordered this encounter  Medications  . methadone (DOLOPHINE) 5 MG tablet    Sig: Take 1 tablet (5 mg total) by mouth every 8 (eight) hours. Must last 30 days.    Dispense:  90 tablet    Refill:  0    [place] STOP ACT - Not applicable. Fill one day early if pharmacy is closed on scheduled refill date.  . methadone (DOLOPHINE) 5 MG tablet    Sig: Take 1 tablet (5 mg total) by mouth every 8 (eight) hours. Must last 30 days.    Dispense:  90 tablet    Refill:  0    [place] STOP ACT - Not applicable. Fill one day early if pharmacy is closed on scheduled refill date.   Follow-up plan:   Return in about 8 weeks (around 03/27/2020) for Medication Management, in person.   Recent Visits Date Type Provider Dept  11/08/19 Office Visit Natasha Santa, MD [facility]  Showing recent visits within past 90 days and meeting all other requirements Today's Visits Date Type Provider Dept  01/31/20 Office Visit Natasha Santa, MD [facility]  Showing today's visits and meeting all other requirements Future  Appointments No visits were found meeting these conditions. Showing future appointments within next 90 days and meeting all other requirements  I discussed the assessment and treatment plan with the patient. The patient was provided an opportunity to ask questions and all were answered. The patient agreed with the plan and demonstrated an  understanding of the instructions.  Patient advised to call back or seek an in-person evaluation if the symptoms or condition worsens.  Duration of encounter: 30 minutes.  Note by: Natasha Santa, MD Date: 01/31/2020; Time: 8:55 AM

## 2020-03-27 ENCOUNTER — Ambulatory Visit
Payer: Managed Care, Other (non HMO) | Attending: Student in an Organized Health Care Education/Training Program | Admitting: Student in an Organized Health Care Education/Training Program

## 2020-03-27 ENCOUNTER — Encounter: Payer: Self-pay | Admitting: Student in an Organized Health Care Education/Training Program

## 2020-03-27 ENCOUNTER — Other Ambulatory Visit: Payer: Self-pay

## 2020-03-27 VITALS — BP 135/68 | HR 91 | Temp 96.6°F | Resp 18 | Ht 62.0 in | Wt 197.0 lb

## 2020-03-27 DIAGNOSIS — G894 Chronic pain syndrome: Secondary | ICD-10-CM | POA: Diagnosis present

## 2020-03-27 DIAGNOSIS — Z0289 Encounter for other administrative examinations: Secondary | ICD-10-CM | POA: Diagnosis present

## 2020-03-27 DIAGNOSIS — M5136 Other intervertebral disc degeneration, lumbar region: Secondary | ICD-10-CM | POA: Diagnosis not present

## 2020-03-27 DIAGNOSIS — Z79891 Long term (current) use of opiate analgesic: Secondary | ICD-10-CM | POA: Insufficient documentation

## 2020-03-27 DIAGNOSIS — M533 Sacrococcygeal disorders, not elsewhere classified: Secondary | ICD-10-CM | POA: Insufficient documentation

## 2020-03-27 DIAGNOSIS — M069 Rheumatoid arthritis, unspecified: Secondary | ICD-10-CM | POA: Insufficient documentation

## 2020-03-27 MED ORDER — METHADONE HCL 5 MG PO TABS: 5.0000 mg | ORAL_TABLET | Freq: Three times a day (TID) | ORAL | 0 refills | Status: DC

## 2020-03-27 MED ORDER — METHADONE HCL 5 MG PO TABS: 5.0000 mg | ORAL_TABLET | Freq: Three times a day (TID) | ORAL | 0 refills | Status: AC

## 2020-03-27 NOTE — Progress Notes (Signed)
Nursing Pain Medication Assessment:  Safety precautions to be maintained throughout the outpatient stay will include: orient to surroundings, keep bed in low position, maintain call bell within reach at all times, provide assistance with transfer out of bed and ambulation.  Medication Inspection Compliance: Pill count conducted under aseptic conditions, in front of the patient. Neither the pills nor the bottle was removed from the patient's sight at any time. Once count was completed pills were immediately returned to the patient in their original bottle.  Medication: Methadone Pill/Patch Count: 17 of 90 pills remain Pill/Patch Appearance: Markings consistent with prescribed medication Bottle Appearance: Standard pharmacy container. Clearly labeled. Filled Date:09 / 10 / 2021 Last Medication intake:  Yesterday

## 2020-03-27 NOTE — Progress Notes (Signed)
PROVIDER NOTE: Information contained herein reflects review and annotations entered in association with encounter. Interpretation of such information and data should be left to medically-trained personnel. Information provided to patient can be located elsewhere in the medical record under "Patient Instructions". Document created using STT-dictation technology, any transcriptional errors that may result from process are unintentional.    Patient: Natasha Gonzales  Service Category: E/M  Provider: Gillis Santa, MD  DOB: Feb 13, 1956  DOS: 03/27/2020  Specialty: Interventional Pain Management  MRN: [id number]  Setting: Ambulatory outpatient  PCP: Cletis Athens, MD  Type: Established Patient    Referring Provider: Cletis Athens, MD  Location: Office  Delivery: Face-to-face     HPI  [name], a 64 y.o. year old female, is here today because of her Rheumatoid arthritis involving multiple sites, unspecified whether rheumatoid factor present ([place]) [M06.9]. [name] primary complain today is Back Pain (low) Last encounter: My last encounter with her was on 01/31/2020. Pertinent problems: [name] does not have any pertinent problems on file. Pain Assessment: Severity of Chronic pain is reported as a 2 /10. Location: Back Lower/radiates around to right groin. Onset: More than a month ago. Quality: Sharp. Timing: Constant. Modifying factor(s): heat, tens. Vitals:  height is '5\' 2"'  (1.575 m) and weight is 197 lb (89.4 kg). Her temperature is 96.6 F (35.9 C) (abnormal). Her blood pressure is 135/68 and her pulse is 91. Her respiration is 18 and oxygen saturation is 99%.   Reason for encounter: medication management.   No change in medical history since last visit.  Patient's pain is at baseline.  Patient continues multimodal pain regimen as prescribed.  States that it provides pain relief and improvement in functional status.  Patient states that her stepfather passed away unexpectedly from a cardiac  complication.  She has had to move her mom out and is providing care for her mom as well.  This results in more activity and exertion which increases her fatigue level during the day.  No changes in bowel regimen.  01/31/20: Second patient visit, has been evaluated by psychiatry.  UDS below is appropriate.  We will take over patient's methadone as below and have her sign pain contract with our pain clinic.  From initial clinic note: 11/08/2019 "Patient is a pleasant 64 year old female who presents with a chief complaint of right groin pain that radiates to her lower back and also in the medial portion of her legs down to her feet. Patient has a history of adhesions from hysterectomy and cystoscopy. She is being referred from Dr. Rebecka Apley for chronic pain management by way of methadone management. Patient has been on methadone for many years. She has reduced her dose down over the last couple of years and she is currently taking 15 mg daily, 5 mg 3 times daily as needed. She denies any foot numbness leg weakness urinary incontinence. She does have a history of lumbar degenerative joint disease. Patient also utilizes complementary and integrative pain management strategies including TENS unit, massage therapy, acupuncture, physical therapy. I commended her on this. She is interested in having her methadone managed by the pain clinic as Dr. Rebecka Apley is going to be retiring soon. Of note she has tried Neurontin in the past which was not very helpful."  Pharmacotherapy Assessment   03/02/2020  2   01/31/2020  Methadone Hcl 5 MG Tablet  90.00  30 Bi Lat   [id number]   Thr (4878)   0/0  45.00 MME  Private  Pay   [place]     Analgesic methadone 5 mg 3 times daily, quantity 90/month; MME equals 45 Monitoring: [place] PMP: PDMP reviewed during this encounter.       Pharmacotherapy: No side-effects or adverse reactions reported. Compliance: No problems identified. Effectiveness: Clinically acceptable.  Dewayne Shorter, RN  03/27/2020  9:33 AM  Sign when Signing Visit Nursing Pain Medication Assessment:  Safety precautions to be maintained throughout the outpatient stay will include: orient to surroundings, keep bed in low position, maintain call bell within reach at all times, provide assistance with transfer out of bed and ambulation.  Medication Inspection Compliance: Pill count conducted under aseptic conditions, in front of the patient. Neither the pills nor the bottle was removed from the patient's sight at any time. Once count was completed pills were immediately returned to the patient in their original bottle.  Medication: Methadone Pill/Patch Count: 17 of 90 pills remain Pill/Patch Appearance: Markings consistent with prescribed medication Bottle Appearance: Standard pharmacy container. Clearly labeled. Filled Date:09 / 10 / 2021 Last Medication intake:  Yesterday    UDS:  Summary  Date Value Ref Range Status  11/08/2019 Note  Final    Comment:    ==================================================================== Compliance Drug Analysis, Ur ==================================================================== Test                             Result       Flag       Units Drug Present   Methadone                      2414                    ng/mg creat   EDDP (Methadone Mtb)           1698                    ng/mg creat    Sources of methadone include scheduled prescription medications.    EDDP is an expected metabolite of methadone.   Acetaminophen                  PRESENT ==================================================================== Test                      Result    Flag   Units      Ref Range   Creatinine              189              mg/dL      >=20 ==================================================================== Declared Medications:  Medication list was not provided. ==================================================================== For clinical consultation,  please call [phone]. ====================================================================      ROS  Constitutional: Denies any fever or chills Gastrointestinal: No reported hemesis, hematochezia, vomiting, or acute GI distress Musculoskeletal: Denies any acute onset joint swelling, redness, loss of ROM, or weakness Neurological: No reported episodes of acute onset apraxia, aphasia, dysarthria, agnosia, amnesia, paralysis, loss of coordination, or loss of consciousness  Medication Review  aspirin EC, fluticasone, hydroxychloroquine, ibuprofen, leflunomide, lisinopril, methadone, multivitamin, pseudoephedrine, and rosuvastatin  History Review  Allergy: [name] has No Known Allergies. Drug: [name]  reports no history of drug use. Alcohol:  reports previous alcohol use. Tobacco:  reports that she has been smoking cigarettes. She has smoked for the past 25.00 years. She has never used smokeless  tobacco. Social: [name]  reports that she has been smoking cigarettes. She has smoked for the past 25.00 years. She has never used smokeless tobacco. She reports previous alcohol use. She reports that she does not use drugs. Medical:  has a past medical history of Anxiety, Depression, Generalized headaches, Hyperlipidemia, Hypertension, Ovarian cyst, and Rheumatoid arthritis ([place]). Surgical: [name]  has a past surgical history that includes Tonsilectomy/adenoidectomy with myringotomy (1984); Abdominal hysterectomy (1993); adhesion (2001); Ovarian cyst removal; and Joint replacement. Family: family history includes Cancer in her brother and another family member; Heart disease in her father and mother; Hypertension in her mother; Kidney disease in her mother.  Laboratory Chemistry Profile   Renal Lab Results  Component Value Date   BUN 13 01/24/2014   CREATININE 1.05 01/24/2014   GFRAA >60 01/24/2014   GFRNONAA 59 (L) 01/24/2014     Hepatic Lab Results  Component Value Date    AST 22 01/24/2014   ALT 30 01/24/2014   ALBUMIN 4.2 01/24/2014   ALKPHOS 74 01/24/2014   LIPASE 129 01/24/2014     Electrolytes Lab Results  Component Value Date   NA 138 01/24/2014   K 4.0 01/24/2014   CL 104 01/24/2014   CALCIUM 8.9 01/24/2014     Bone No results found for: VD25OH, VD125OH2TOT, LF8101BP1, WC5852DP8, 25OHVITD1, 25OHVITD2, 25OHVITD3, TESTOFREE, TESTOSTERONE   Inflammation (CRP: Acute Phase) (ESR: Chronic Phase) No results found for: CRP, ESRSEDRATE, LATICACIDVEN     Note: Above Lab results reviewed.  Recent Imaging Review  MM 3D SCREEN BREAST BILATERAL CLINICAL DATA:  Screening.  EXAM: DIGITAL SCREENING BILATERAL MAMMOGRAM WITH TOMO AND CAD  COMPARISON:  Previous exam(s).  ACR Breast Density Category b: There are scattered areas of fibroglandular density.  FINDINGS: There are no findings suspicious for malignancy. Images were processed with CAD.  IMPRESSION: No mammographic evidence of malignancy. A result letter of this screening mammogram will be mailed directly to the patient.  RECOMMENDATION: Screening mammogram in one year. (Code:SM-B-01Y)  BI-RADS CATEGORY  1: Negative.  Electronically Signed   By: Margarette Canada M.D.   On: 04/05/2019 14:47 Note: Reviewed        Physical Exam  General appearance: Well nourished, well developed, and well hydrated. In no apparent acute distress Mental status: Alert, oriented x 3 (person, place, & time)       Respiratory: No evidence of acute respiratory distress Eyes: PERLA Vitals: BP 135/68   Pulse 91   Temp (!) 96.6 F (35.9 C)   Resp 18   Ht '5\' 2"'  (1.575 m)   Wt 197 lb (89.4 kg)   SpO2 99%   BMI 36.03 kg/m  BMI: Estimated body mass index is 36.03 kg/m as calculated from the following:   Height as of this encounter: '5\' 2"'  (1.575 m).   Weight as of this encounter: 197 lb (89.4 kg). Ideal: Ideal body weight: 50.1 kg (110 lb 7.2 oz) Adjusted ideal body weight: 65.8 kg (145 lb 1.1  oz)  Thoracic Spine Area Exam  Skin & Axial Inspection:No masses, redness, or swelling Alignment:Symmetrical Functional EUM:[id number] ROM Stability:No instability detected Muscle Tone/Strength:Functionally intact. No obvious neuro-muscular anomalies detected. Sensory (Neurological):Unimpaired Muscle strength & Tone:No palpable anomalies  Lumbar Exam  Skin & Axial Inspection:No masses, redness, or swelling Alignment:Symmetrical Functional GQQ:[id number] ROM Stability:No instability detected Muscle Tone/Strength:Functionally intact. No obvious neuro-muscular anomalies detected. Sensory (Neurological):Musculoskeletal pain pattern Palpation:No palpable anomalies Provocative Tests: Hyperextension/rotation test:deferred today Lumbar quadrant test (Kemp's test):deferred today Lateral bending test:deferred today  Patrick's Maneuver:deferred today FABER* test:deferred today S-I anterior distraction/compression test:deferred today S-I lateral compression test:deferred today S-I Thigh-thrust test:deferred today S-I Gaenslen's test:deferred today *(Flexion, ABduction and External Rotation)  Gait & Posture Assessment  Ambulation:Unassisted Gait:Relatively normal for age and body habitus Posture:WNL  Lower Extremity Exam    Side:Right lower extremity  Side:Left lower extremity  Stability:No instability observed  Stability:No instability observed  Skin & Extremity Inspection:Skin color, temperature, and hair growth are WNL. No peripheral edema or cyanosis. No masses, redness, swelling, asymmetry, or associated skin lesions. No contractures.  Skin & Extremity Inspection:Skin color, temperature, and hair growth are WNL. No peripheral edema or cyanosis. No masses, redness, swelling, asymmetry, or associated skin lesions. No contractures.   Functional BVA:POLIDCVUDTHY ROM   Functional HOO:[id number] ROM   Muscle Tone/Strength:Functionally intact. No obvious neuro-muscular anomalies detected.  Muscle Tone/Strength:Functionally intact. No obvious neuro-muscular anomalies detected.  Sensory (Neurological):Musculoskeletal pain pattern  Sensory (Neurological):Musculoskeletal pain pattern  DTR: Patellar:deferred today Achilles:deferred today Plantar:deferred today  DTR: Patellar:deferred today Achilles:deferred today Plantar:deferred today  Palpation:No palpable anomalies  Palpation:No palpable anomalies     Assessment   Status Diagnosis  Controlled Controlled Controlled 1. Rheumatoid arthritis involving multiple sites, unspecified whether rheumatoid factor present ([place])   2. Lumbar degenerative disc disease   3. Sacroiliac joint pain   4. Encounter for long-term methadone use for pain control   5. Pain management contract signed   6. Chronic pain syndrome       Plan of Care   [name] has a current medication list which includes the following long-term medication(s): fluticasone, lisinopril, and rosuvastatin.  Pharmacotherapy (Medications Ordered): Meds ordered this encounter  Medications  . methadone (DOLOPHINE) 5 MG tablet    Sig: Take 1 tablet (5 mg total) by mouth every 8 (eight) hours. Must last 30 days.    Dispense:  90 tablet    Refill:  0    [place] STOP ACT - Not applicable. Fill one day early if pharmacy is closed on scheduled refill date.  . methadone (DOLOPHINE) 5 MG tablet    Sig: Take 1 tablet (5 mg total) by mouth every 8 (eight) hours. Must last 30 days.    Dispense:  90 tablet    Refill:  0    [place] STOP ACT - Not applicable. Fill one day early if pharmacy is closed on scheduled refill date.   Follow-up plan:   Return in about 9 weeks (around 05/29/2020) for Medication Management, in person.   Recent Visits Date Type  Provider Dept  01/31/20 Office Visit Gillis Santa, MD [facility]  Showing recent visits within past 90 days and meeting all other requirements Today's Visits Date Type Provider Dept  03/27/20 Office Visit Gillis Santa, MD [facility]  Showing today's visits and meeting all other requirements Future Appointments Date Type Provider Dept  05/24/20 Appointment Gillis Santa, MD [facility]  Showing future appointments within next 90 days and meeting all other requirements  I discussed the assessment and treatment plan with the patient. The patient was provided an opportunity to ask questions and all were answered. The patient agreed with the plan and demonstrated an understanding of the instructions.  Patient advised to call back or seek an in-person evaluation if the symptoms or condition worsens.  Duration of encounter:31mnutes.  Note by: BGillis Santa MD Date: 03/27/2020; Time: 10:07 AM

## 2020-04-02 ENCOUNTER — Ambulatory Visit (INDEPENDENT_AMBULATORY_CARE_PROVIDER_SITE_OTHER): Payer: Managed Care, Other (non HMO) | Admitting: Internal Medicine

## 2020-04-02 ENCOUNTER — Encounter: Payer: Self-pay | Admitting: Internal Medicine

## 2020-04-02 ENCOUNTER — Other Ambulatory Visit: Payer: Self-pay

## 2020-04-02 VITALS — BP 151/75 | HR 80 | Ht 62.0 in | Wt 197.0 lb

## 2020-04-02 DIAGNOSIS — I10 Essential (primary) hypertension: Secondary | ICD-10-CM

## 2020-04-02 DIAGNOSIS — M0579 Rheumatoid arthritis with rheumatoid factor of multiple sites without organ or systems involvement: Secondary | ICD-10-CM

## 2020-04-02 DIAGNOSIS — Z720 Tobacco use: Secondary | ICD-10-CM

## 2020-04-02 DIAGNOSIS — J019 Acute sinusitis, unspecified: Secondary | ICD-10-CM

## 2020-04-02 DIAGNOSIS — Z23 Encounter for immunization: Secondary | ICD-10-CM

## 2020-04-02 LAB — POC COVID19 BINAXNOW: SARS Coronavirus 2 Ag: NEGATIVE

## 2020-04-02 MED ORDER — AZITHROMYCIN 250 MG PO TABS: ORAL_TABLET | ORAL | 0 refills | Status: DC

## 2020-04-02 MED ORDER — ROSUVASTATIN CALCIUM 40 MG PO TABS
40.0000 mg | ORAL_TABLET | Freq: Every day | ORAL | 3 refills | Status: DC
Start: 2020-04-02 — End: 2021-02-02

## 2020-04-02 NOTE — Progress Notes (Signed)
Established Patient Office Visit  SUBJECTIVE:  Subjective  Patient ID: Natasha Gonzales, female    DOB: Nov 13, 1955  Age: 64 y.o. MRN: [id number]  CC:  Chief Complaint  Patient presents with  . Hearing Loss    patient feels her ear are stopped up    HPI Natasha Gonzales is a 64 y.o. female presenting today for a re-evaluation of her ears  She notes that she feels like her ears are stopped up and like there is fluid behind her ears. She states that this is something that has been going on for the last two years; she is allergic to something in her mother's house and it makes this worse for her. She does have sinus issues at the moment.   Past Medical History:  Diagnosis Date  . Anxiety   . Depression   . Generalized headaches   . Hyperlipidemia   . Hypertension   . Ovarian cyst   . Rheumatoid arthritis [facility])     Past Surgical History:  Procedure Laterality Date  . ABDOMINAL HYSTERECTOMY  1993   total  . adhesion  2001   laprascopic  . JOINT REPLACEMENT    . OVARIAN CYST REMOVAL    . TONSILECTOMY/ADENOIDECTOMY WITH MYRINGOTOMY  1984    Family History  Problem Relation Age of Onset  . Cancer Brother   . Cancer Other   . Hypertension Mother   . Heart disease Mother   . Kidney disease Mother   . Heart disease Father   . Breast cancer Neg Hx     Social History   Socioeconomic History  . Marital status: Divorced    Spouse name: Not on file  . Number of children: 3  . Years of education: Not on file  . Highest education level: Not on file  Occupational History  . Not on file  Tobacco Use  . Smoking status: Current Every Day Smoker    Years: 25.00    Types: Cigarettes  . Smokeless tobacco: Never Used  . Tobacco comment: Stress smoker. 1 cig every now and then  Substance and Sexual Activity  . Alcohol use: Not Currently  . Drug use: Never  . Sexual activity: Not on file  Other Topics Concern  . Not on file  Social History Narrative  . Not on file    Social Determinants of Health   Financial Resource Strain:   . Difficulty of Paying Living Expenses: Not on file  Food Insecurity:   . Worried About Programme researcher, broadcasting/film/video in the Last Year: Not on file  . Ran Out of Food in the Last Year: Not on file  Transportation Needs:   . Lack of Transportation (Medical): Not on file  . Lack of Transportation (Non-Medical): Not on file  Physical Activity:   . Days of Exercise per Week: Not on file  . Minutes of Exercise per Session: Not on file  Stress:   . Feeling of Stress : Not on file  Social Connections:   . Frequency of Communication with Friends and Family: Not on file  . Frequency of Social Gatherings with Friends and Family: Not on file  . Attends Religious Services: Not on file  . Active Member of Clubs or Organizations: Not on file  . Attends Banker Meetings: Not on file  . Marital Status: Not on file  Intimate Partner Violence:   . Fear of Current or Ex-Partner: Not on file  . Emotionally Abused: Not on file  .  Physically Abused: Not on file  . Sexually Abused: Not on file     Current Outpatient Medications:  .  aspirin EC 81 MG tablet, Take 81 mg by mouth daily., Disp: , Rfl:  .  fluticasone (FLONASE) 50 MCG/ACT nasal spray, Place into both nostrils daily. As needed, Disp: , Rfl:  .  hydroxychloroquine (PLAQUENIL) 200 MG tablet, Take 200 mg by mouth daily. , Disp: , Rfl:  .  ibuprofen (ADVIL,MOTRIN) 800 MG tablet, Take 800 mg by mouth every 6 (six) hours as needed., Disp: , Rfl:  .  leflunomide (ARAVA) 10 MG tablet, Take 1 tablet by mouth daily., Disp: , Rfl:  .  lisinopril (ZESTRIL) 20 MG tablet, Take 20 mg by mouth daily., Disp: , Rfl:  .  methadone (DOLOPHINE) 5 MG tablet, Take 1 tablet (5 mg total) by mouth every 8 (eight) hours. Must last 30 days., Disp: 90 tablet, Rfl: 0 .  [START ON 04/30/2020] methadone (DOLOPHINE) 5 MG tablet, Take 1 tablet (5 mg total) by mouth every 8 (eight) hours. Must last 30 days.,  Disp: 90 tablet, Rfl: 0 .  Multiple Vitamin (MULTIVITAMIN) capsule, Take 1 capsule by mouth daily., Disp: , Rfl:  .  pseudoephedrine (SUDAFED) 30 MG tablet, Take 30 mg by mouth every 4 (four) hours as needed for congestion., Disp: , Rfl:  .  rosuvastatin (CRESTOR) 40 MG tablet, Take 1 tablet (40 mg total) by mouth daily., Disp: 90 tablet, Rfl: 3 .  azithromycin (ZITHROMAX) 250 MG tablet, 2 tab po daily for 3 days, Disp: 6 tablet, Rfl: 0   No Known Allergies  ROS Review of Systems  Constitutional: Negative.  Negative for fatigue and fever.  HENT: Positive for ear pain (R side), hearing loss and sinus pressure. Negative for postnasal drip and trouble swallowing.   Eyes: Negative.   Respiratory: Negative.   Cardiovascular: Negative.   Gastrointestinal: Negative.   Endocrine: Negative.   Genitourinary: Negative.   Musculoskeletal: Positive for arthralgias (stable).  Skin: Negative.   Allergic/Immunologic: Positive for environmental allergies.  Neurological: Negative.   Hematological: Negative.   Psychiatric/Behavioral: Negative.   All other systems reviewed and are negative.    OBJECTIVE:    Physical Exam Vitals reviewed.  Constitutional:      Appearance: Normal appearance.  HENT:     Right Ear: Decreased hearing noted. There is no impacted cerumen.     Left Ear: Decreased hearing noted. There is no impacted cerumen.     Mouth/Throat:     Mouth: Mucous membranes are moist.  Eyes:     Pupils: Pupils are equal, round, and reactive to light.  Neck:     Vascular: No carotid bruit.  Cardiovascular:     Rate and Rhythm: Normal rate and regular rhythm.     Pulses: Normal pulses.     Heart sounds: Normal heart sounds.  Pulmonary:     Effort: Pulmonary effort is normal.     Breath sounds: Normal breath sounds.  Abdominal:     General: Bowel sounds are normal.     Palpations: Abdomen is soft. There is no hepatomegaly, splenomegaly or mass.     Tenderness: There is no abdominal  tenderness.     Hernia: No hernia is present.  Musculoskeletal:        General: No tenderness.     Cervical back: Neck supple.     Right lower leg: No edema.     Left lower leg: No edema.  Skin:    Findings:  No rash.  Neurological:     Mental Status: She is alert and oriented to person, place, and time.     Motor: No weakness.  Psychiatric:        Mood and Affect: Mood and affect normal.        Behavior: Behavior normal.     BP (!) 151/75   Pulse 80   Ht 5\' 2"  (1.575 m)   Wt 197 lb (89.4 kg)   BMI 36.03 kg/m  Wt Readings from Last 3 Encounters:  04/02/20 197 lb (89.4 kg)  03/27/20 197 lb (89.4 kg)  01/31/20 197 lb (89.4 kg)    Health Maintenance Due  Topic Date Due  . Hepatitis C Screening  Never done  . COVID-19 Vaccine (1) Never done  . HIV Screening  Never done  . TETANUS/TDAP  Never done  . PAP SMEAR-Modifier  Never done  . COLONOSCOPY  Never done    There are no preventive care reminders to display for this patient.  CBC Latest Ref Rng & Units 01/24/2014  WBC 3.6 - 11.0 x10 3/mm 3 5.1  Hemoglobin 12.0 - 16.0 g/dL 23.7  Hematocrit 62.8 - 47.0 % 40.0  Platelets 150 - 440 x10 3/mm 3 272   CMP Latest Ref Rng & Units 01/24/2014  Glucose 65 - 99 mg/dL 96  BUN 7 - 18 mg/dL 13  Creatinine 3.15 - 1.76 mg/dL 1.60  Sodium 737 - 106 mmol/L 138  Potassium 3.5 - 5.1 mmol/L 4.0  Chloride 98 - 107 mmol/L 104  CO2 21 - 32 mmol/L 26  Calcium 8.5 - 10.1 mg/dL 8.9  Total Protein 6.4 - 8.2 g/dL 7.9  Total Bilirubin 0.2 - 1.0 mg/dL 0.5  Alkaline Phos Unit/L 74  AST 15 - 37 Unit/L 22  ALT U/L 30    No results found for: TSH Lab Results  Component Value Date   ALBUMIN 4.2 01/24/2014   ANIONGAP 8 01/24/2014   No results found for: CHOL, HDL, LDLCALC, CHOLHDL No results found for: TRIG No results found for: HGBA1C    ASSESSMENT & PLAN:   Problem List Items Addressed This Visit      Cardiovascular and Mediastinum   Essential hypertension    - Today, the  patient's blood pressure is well managed on lisinopril . - The patient will continue the current treatment regimen.  - I encouraged the patient to eat a low-sodium diet to help control blood pressure. - I encouraged the patient to live an active lifestyle and complete activities that increases heart rate to 85% target heart rate at least 5 times per week for one hour.          Relevant Medications   rosuvastatin (CRESTOR) 40 MG tablet     Respiratory   Acute sinusitis with symptoms > 10 days    Patient complaining of fullness of the both ears. She also has a sore throat. I checked her both ears she was found to have fluid left more than on the right. Small amount of wax was also noted. I told her to see the ENT for further evaluation      Relevant Medications   azithromycin (ZITHROMAX) 250 MG tablet   Other Relevant Orders   POC COVID-19 (Completed)     Musculoskeletal and Integument   Rheumatoid arthritis involving multiple sites with positive rheumatoid factor (HCC)    Patient was advised to continue her to follow-up with rheumatologist for her rheumatoid arthritis.  Other   Tobacco abuse    - I instructed the patient to stop smoking and provided them with smoking cessation materials.  - I informed the patient that smoking puts them at increased risk for cancer, COPD, hypertension, and more.  - Informed the patient to seek help if they begin to have trouble breathing, develop chest pain, start to cough up blood, feel faint, or pass out.        Need for influenza vaccination - Primary    Patient was advised to have the flu shot as soon as possible      Relevant Orders   Flu Vaccine QUAD 6+ mos PF IM (Fluarix Quad PF) (Completed)      Meds ordered this encounter  Medications  . rosuvastatin (CRESTOR) 40 MG tablet    Sig: Take 1 tablet (40 mg total) by mouth daily.    Dispense:  90 tablet    Refill:  3  . azithromycin (ZITHROMAX) 250 MG tablet    Sig: 2 tab  po daily for 3 days    Dispense:  6 tablet    Refill:  0    Follow-up: No follow-ups on file.    Corky Downs, MD [facility] [address], [place], [place] [address]   By signing my name below, I, [employer], attest that this documentation has been prepared under the direction and in the presence of Dr. Corky Downs Electronically Signed: Corky Downs, MD 04/08/20, 4:15 PM  I personally performed the services described in this documentation, which was SCRIBED in my presence. The recorded information has been reviewed and considered accurate. It has been edited as necessary during review. Corky Downs, MD

## 2020-04-06 ENCOUNTER — Other Ambulatory Visit: Payer: Self-pay | Admitting: *Deleted

## 2020-04-08 ENCOUNTER — Encounter: Payer: Self-pay | Admitting: Internal Medicine

## 2020-04-08 NOTE — Assessment & Plan Note (Signed)
Patient was advised to have the flu shot as soon as possible

## 2020-04-08 NOTE — Assessment & Plan Note (Signed)
Patient was advised to continue her to follow-up with rheumatologist for her rheumatoid arthritis.

## 2020-04-08 NOTE — Assessment & Plan Note (Signed)
Patient complaining of fullness of the both ears. She also has a sore throat. I checked her both ears she was found to have fluid left more than on the right. Small amount of wax was also noted. I told her to see the ENT for further evaluation

## 2020-04-08 NOTE — Assessment & Plan Note (Signed)
-   I instructed the patient to stop smoking and provided them with smoking cessation materials.  - I informed the patient that smoking puts them at increased risk for cancer, COPD, hypertension, and more.  - Informed the patient to seek help if they begin to have trouble breathing, develop chest pain, start to cough up blood, feel faint, or pass out.  

## 2020-04-08 NOTE — Assessment & Plan Note (Signed)
-   Today, the patient's blood pressure is well managed on lisinopril. - The patient will continue the current treatment regimen.  - I encouraged the patient to eat a low-sodium diet to help control blood pressure. - I encouraged the patient to live an active lifestyle and complete activities that increases heart rate to 85% target heart rate at least 5 times per week for one hour.     

## 2020-05-24 ENCOUNTER — Ambulatory Visit
Payer: Managed Care, Other (non HMO) | Attending: Student in an Organized Health Care Education/Training Program | Admitting: Student in an Organized Health Care Education/Training Program

## 2020-05-24 ENCOUNTER — Encounter: Payer: Self-pay | Admitting: Student in an Organized Health Care Education/Training Program

## 2020-05-24 ENCOUNTER — Other Ambulatory Visit: Payer: Self-pay

## 2020-05-24 VITALS — BP 151/85 | HR 80 | Temp 98.4°F | Resp 16 | Ht 62.0 in | Wt 190.0 lb

## 2020-05-24 DIAGNOSIS — M533 Sacrococcygeal disorders, not elsewhere classified: Secondary | ICD-10-CM | POA: Insufficient documentation

## 2020-05-24 DIAGNOSIS — Z0289 Encounter for other administrative examinations: Secondary | ICD-10-CM | POA: Insufficient documentation

## 2020-05-24 DIAGNOSIS — M069 Rheumatoid arthritis, unspecified: Secondary | ICD-10-CM | POA: Diagnosis present

## 2020-05-24 DIAGNOSIS — G894 Chronic pain syndrome: Secondary | ICD-10-CM | POA: Diagnosis present

## 2020-05-24 DIAGNOSIS — Z79891 Long term (current) use of opiate analgesic: Secondary | ICD-10-CM | POA: Insufficient documentation

## 2020-05-24 DIAGNOSIS — M5136 Other intervertebral disc degeneration, lumbar region: Secondary | ICD-10-CM

## 2020-05-24 MED ORDER — METHADONE HCL 5 MG PO TABS: 5.0000 mg | ORAL_TABLET | Freq: Three times a day (TID) | ORAL | 0 refills | Status: DC

## 2020-05-24 MED ORDER — METHADONE HCL 5 MG PO TABS: 5.0000 mg | ORAL_TABLET | Freq: Three times a day (TID) | ORAL | 0 refills | Status: AC

## 2020-05-24 NOTE — Patient Instructions (Signed)
Methadone to last until 08/28/20 has been escribed to your pharmacy.

## 2020-05-24 NOTE — Progress Notes (Signed)
PROVIDER NOTE: Information contained herein reflects review and annotations entered in association with encounter. Interpretation of such information and data should be left to medically-trained personnel. Information provided to patient can be located elsewhere in the medical record under "Patient Instructions". Document created using STT-dictation technology, any transcriptional errors that may result from process are unintentional.    Patient: Natasha Gonzales  Service Category: E/M  Provider: Gillis Santa, MD  DOB: 15-Nov-1955  DOS: 05/24/2020  Specialty: Interventional Pain Management  MRN: [id number]  Setting: Ambulatory outpatient  PCP: Cletis Athens, MD  Type: Established Patient    Referring Provider: Cletis Athens, MD  Location: Office  Delivery: Face-to-face     HPI  Ms. SHELIAH FIORILLO, a 64 y.o. year old female, is here today because of her Chronic pain syndrome [G89.4]. Ms. Pine primary complain today is Back Pain (lower) Last encounter: My last encounter with her was on 03/27/2020. Pertinent problems: Ms. Kisling has Anxiety; Essential hypertension; Rheumatoid arthritis involving multiple sites with positive rheumatoid factor ([place]); Chronic pain syndrome; Lumbar degenerative disc disease; Sacroiliac joint pain; Encounter for long-term methadone use for pain control; and Pain management contract signed on their pertinent problem list. Pain Assessment: Severity of Chronic pain is reported as a 2 /10. Location: Back Lower/around and down inner aspects of legs bilat to include all toes. Onset: More than a month ago. Quality: Sharp. Timing: Constant. Modifying factor(s): meds. Vitals:  height is _0  (1.575 m) and weight is 190 lb (86.2 kg). Her temporal temperature is 98.4 F (36.9 C). Her blood pressure is 151/85 (abnormal) and her pulse is 80. Her respiration is 16 and oxygen saturation is 99%.   Reason for encounter: medication management.   Pharmacotherapy Assessment   No change in  medical history since last visit.  Patient's pain is at baseline.  Patient continues multimodal pain regimen as prescribed.  States that it provides pain relief and improvement in functional status.  05/01/2020  03/27/2020   2  Methadone Hcl 5 Mg Tablet 90.00  30  Bi Lat  [id number]  Thr (4878)  0/0  45.00 MME  Private Pay  [place]       Analgesic: Methadone 5 mg TID prn #90/month, MME= 45   Monitoring: [place] PMP: PDMP reviewed during this encounter.       Pharmacotherapy: No side-effects or adverse reactions reported. Compliance: No problems identified. Effectiveness: Clinically acceptable.  Dilpreet, Faires, RN  05/24/2020 11:19 AM  Sign when Signing Visit Nursing Pain Medication Assessment:  Safety precautions to be maintained throughout the outpatient stay will include: orient to surroundings, keep bed in low position, maintain call bell within reach at all times, provide assistance with transfer out of bed and ambulation.  Medication Inspection Compliance: Pill count conducted under aseptic conditions, in front of the patient. Neither the pills nor the bottle was removed from the patient's sight at any time. Once count was completed pills were immediately returned to the patient in their original bottle.  Medication: Methadone Pill/Patch Count: 36 of 90 pills remain Pill/Patch Appearance: Markings consistent with prescribed medication Bottle Appearance: Standard pharmacy container. Clearly labeled. Filled Date: 11/ 09 / 2021 Last Medication intake:  Today    UDS:  Summary  Date Value Ref Range Status  11/08/2019 Note  Final    Comment:    ==================================================================== Compliance Drug Analysis, Ur ==================================================================== Test  Result       Flag       Units Drug Present   Methadone                      2414                    ng/mg creat   EDDP (Methadone Mtb)           1698                     ng/mg creat    Sources of methadone include scheduled prescription medications.    EDDP is an expected metabolite of methadone.   Acetaminophen                  PRESENT ==================================================================== Test                      Result    Flag   Units      Ref Range   Creatinine              189              mg/dL      >=20 ==================================================================== Declared Medications:  Medication list was not provided. ==================================================================== For clinical consultation, please call [phone]. ====================================================================      ROS  Constitutional: Denies any fever or chills Gastrointestinal: No reported hemesis, hematochezia, vomiting, or acute GI distress Musculoskeletal: Denies any acute onset joint swelling, redness, loss of ROM, or weakness Neurological: No reported episodes of acute onset apraxia, aphasia, dysarthria, agnosia, amnesia, paralysis, loss of coordination, or loss of consciousness  Medication Review  aspirin EC, fluticasone, hydroxychloroquine, ibuprofen, lisinopril, methadone, multivitamin, pseudoephedrine, and rosuvastatin  History Review  Allergy: Ms. Orris has No Known Allergies. Drug: Ms. Chapa  reports no history of drug use. Alcohol:  reports previous alcohol use. Tobacco:  reports that she has been smoking cigarettes. She has smoked for the past 25.00 years. She has never used smokeless tobacco. Social: Ms. Capano  reports that she has been smoking cigarettes. She has smoked for the past 25.00 years. She has never used smokeless tobacco. She reports previous alcohol use. She reports that she does not use drugs. Medical:  has a past medical history of Anxiety, Depression, Generalized headaches, Hyperlipidemia, Hypertension, Ovarian cyst, and Rheumatoid arthritis ([place]). Surgical: Ms. Prospero  has a  past surgical history that includes Tonsilectomy/adenoidectomy with myringotomy (1984); Abdominal hysterectomy (1993); adhesion (2001); Ovarian cyst removal; and Joint replacement. Family: family history includes Cancer in her brother and another family member; Heart disease in her father and mother; Hypertension in her mother; Kidney disease in her mother.  Laboratory Chemistry Profile   Renal Lab Results  Component Value Date   BUN 13 01/24/2014   CREATININE 1.05 01/24/2014   GFRAA >60 01/24/2014   GFRNONAA 59 (L) 01/24/2014     Hepatic Lab Results  Component Value Date   AST 22 01/24/2014   ALT 30 01/24/2014   ALBUMIN 4.2 01/24/2014   ALKPHOS 74 01/24/2014   LIPASE 129 01/24/2014     Electrolytes Lab Results  Component Value Date   NA 138 01/24/2014   K 4.0 01/24/2014   CL 104 01/24/2014   CALCIUM 8.9 01/24/2014     Bone No results found for: VD25OH, VD125OH2TOT, DP9470RA1, HH8343BD5, 25OHVITD1, 25OHVITD2, 25OHVITD3, TESTOFREE, TESTOSTERONE   Inflammation (CRP: Acute Phase) (ESR: Chronic Phase) No results found  for: CRP, ESRSEDRATE, LATICACIDVEN     Note: Above Lab results reviewed.  Recent Imaging Review  MM 3D SCREEN BREAST BILATERAL CLINICAL DATA:  Screening.  EXAM: DIGITAL SCREENING BILATERAL MAMMOGRAM WITH TOMO AND CAD  COMPARISON:  Previous exam(s).  ACR Breast Density Category b: There are scattered areas of fibroglandular density.  FINDINGS: There are no findings suspicious for malignancy. Images were processed with CAD.  IMPRESSION: No mammographic evidence of malignancy. A result letter of this screening mammogram will be mailed directly to the patient.  RECOMMENDATION: Screening mammogram in one year. (Code:SM-B-01Y)  BI-RADS CATEGORY  1: Negative.  Electronically Signed   By: Margarette Canada M.D.   On: 04/05/2019 14:47 Note: Reviewed        Physical Exam  General appearance: Well nourished, well developed, and well hydrated. In no  apparent acute distress Mental status: Alert, oriented x 3 (person, place, & time)       Respiratory: No evidence of acute respiratory distress Eyes: PERLA Vitals: BP (!) 151/85    Pulse 80    Temp 98.4 F (36.9 C) (Temporal)    Resp 16    Ht _0  (1.575 m)    Wt 190 lb (86.2 kg)    SpO2 99%    BMI 34.75 kg/m  BMI: Estimated body mass index is 34.75 kg/m as calculated from the following:   Height as of this encounter: _1  (1.575 m).   Weight as of this encounter: 190 lb (86.2 kg). Ideal: Ideal body weight: 50.1 kg (110 lb 7.2 oz) Adjusted ideal body weight: 64.5 kg (142 lb 4.3 oz)  Thoracic Spine Area Exam  Skin & Axial Inspection:No masses, redness, or swelling Alignment:Symmetrical Functional ZOX:[id number] ROM Stability:No instability detected Muscle Tone/Strength:Functionally intact. No obvious neuro-muscular anomalies detected. Sensory (Neurological):Unimpaired Muscle strength & Tone:No palpable anomalies  Lumbar Exam  Skin & Axial Inspection:No masses, redness, or swelling Alignment:Symmetrical Functional BJY:[id number] ROM Stability:No instability detected Muscle Tone/Strength:Functionally intact. No obvious neuro-muscular anomalies detected. Sensory (Neurological):Musculoskeletal pain pattern Palpation:No palpable anomalies Provocative Tests: Hyperextension/rotation test:deferred today Lumbar quadrant test (Kemp's test):deferred today Lateral bending test:deferred today Patrick's Maneuver:deferred today FABER* test:deferred today S-I anterior distraction/compression test:deferred today S-I lateral compression test:deferred today S-I Thigh-thrust test:deferred today S-I Gaenslen's test:deferred today *(Flexion, ABduction and External Rotation)  Gait & Posture Assessment  Ambulation:Unassisted Gait:Relatively normal for age and body  habitus Posture:WNL  Lower Extremity Exam    Side:Right lower extremity  Side:Left lower extremity  Stability:No instability observed  Stability:No instability observed  Skin & Extremity Inspection:Skin color, temperature, and hair growth are WNL. No peripheral edema or cyanosis. No masses, redness, swelling, asymmetry, or associated skin lesions. No contractures.  Skin & Extremity Inspection:Skin color, temperature, and hair growth are WNL. No peripheral edema or cyanosis. No masses, redness, swelling, asymmetry, or associated skin lesions. No contractures.  Functional VHQ:[id number] ROM   Functional KGM:[id number] ROM   Muscle Tone/Strength:Functionally intact. No obvious neuro-muscular anomalies detected.  Muscle Tone/Strength:Functionally intact. No obvious neuro-muscular anomalies detected.  Sensory (Neurological):Musculoskeletal pain pattern  Sensory (Neurological):Musculoskeletal pain pattern  DTR: Patellar:deferred today Achilles:deferred today Plantar:deferred today  DTR: Patellar:deferred today Achilles:deferred today Plantar:deferred today  Palpation:No palpable anomalies  Palpation:No palpable anomalies    Assessment   Status Diagnosis  Controlled Controlled Controlled 1. Chronic pain syndrome   2. Rheumatoid arthritis involving multiple sites, unspecified whether rheumatoid factor present ([place])   3. Lumbar degenerative disc disease   4. Sacroiliac joint pain   5. Pain management contract signed  6. Encounter for long-term methadone use for pain control      Updated Problems: Problem  Encounter for Long-Term Methadone Use for Pain Control   5 mg 3 times daily pain contract signed 01/31/2020   Pain Management Contract Signed   01/31/2020.  UDS up-to-date and appropriate.  Has been evaluated by psychiatry, low risk for substance abuse disorder.  Has been on methadone  long-term, being transferred from Dr. Rebecka Apley.  Continue at 5 mg 3 times daily as needed.  Recommend EKG to evaluate QTC every 12 months.   Lumbar Degenerative Disc Disease  Sacroiliac Joint Pain  Anxiety  Essential Hypertension  Rheumatoid Arthritis Involving Multiple Sites With Positive Rheumatoid Factor ([facility])  Chronic Pain Syndrome    Plan of Care  Ms. MARIAMAWIT DEPAOLI has a current medication list which includes the following long-term medication(s): fluticasone, lisinopril, and rosuvastatin.  Pharmacotherapy (Medications Ordered): Meds ordered this encounter  Medications   methadone (DOLOPHINE) 5 MG tablet    Sig: Take 1 tablet (5 mg total) by mouth every 8 (eight) hours. Must last 30 days.    Dispense:  90 tablet    Refill:  0    [place] STOP ACT - Not applicable. Fill one day early if pharmacy is closed on scheduled refill date.   methadone (DOLOPHINE) 5 MG tablet    Sig: Take 1 tablet (5 mg total) by mouth every 8 (eight) hours. Must last 30 days.    Dispense:  90 tablet    Refill:  0    [place] STOP ACT - Not applicable. Fill one day early if pharmacy is closed on scheduled refill date.   methadone (DOLOPHINE) 5 MG tablet    Sig: Take 1 tablet (5 mg total) by mouth every 8 (eight) hours. Must last 30 days.    Dispense:  90 tablet    Refill:  0    [place] STOP ACT - Not applicable. Fill one day early if pharmacy is closed on scheduled refill date.    Follow-up plan:   Return in about 3 months (around 08/22/2020) for Medication Management, in person.   Recent Visits Date Type Provider Dept  03/27/20 Office Visit Gillis Santa, MD [facility]  Showing recent visits within past 90 days and meeting all other requirements Today's Visits Date Type Provider Dept  05/24/20 Office Visit Gillis Santa, MD [facility]  Showing today's visits and meeting all other requirements Future Appointments Date Type Provider Dept  08/21/20 Appointment Gillis Santa, MD [facility]  Showing future appointments within next 90 days and meeting all other requirements  I discussed the assessment and treatment plan with the patient. The patient was provided an opportunity to ask questions and all were answered. The patient agreed with the plan and demonstrated an understanding of the instructions.  Patient advised to call back or seek an in-person evaluation if the symptoms or condition worsens.  Duration of encounter: 30 minutes.  Note by: Gillis Santa, MD Date: 05/24/2020; Time: 12:28 PM

## 2020-05-24 NOTE — Progress Notes (Signed)
Nursing Pain Medication Assessment:  Safety precautions to be maintained throughout the outpatient stay will include: orient to surroundings, keep bed in low position, maintain call bell within reach at all times, provide assistance with transfer out of bed and ambulation.  Medication Inspection Compliance: Pill count conducted under aseptic conditions, in front of the patient. Neither the pills nor the bottle was removed from the patient's sight at any time. Once count was completed pills were immediately returned to the patient in their original bottle.  Medication: Methadone Pill/Patch Count: 36 of 90 pills remain Pill/Patch Appearance: Markings consistent with prescribed medication Bottle Appearance: Standard pharmacy container. Clearly labeled. Filled Date: 11/ 09 / 2021 Last Medication intake:  Today

## 2020-08-21 ENCOUNTER — Other Ambulatory Visit: Payer: Self-pay

## 2020-08-21 ENCOUNTER — Encounter: Payer: Self-pay | Admitting: Student in an Organized Health Care Education/Training Program

## 2020-08-21 ENCOUNTER — Ambulatory Visit
Payer: Managed Care, Other (non HMO) | Attending: Student in an Organized Health Care Education/Training Program | Admitting: Student in an Organized Health Care Education/Training Program

## 2020-08-21 VITALS — BP 141/72 | HR 71 | Temp 97.7°F | Resp 18 | Ht 62.0 in | Wt 190.0 lb

## 2020-08-21 DIAGNOSIS — M5136 Other intervertebral disc degeneration, lumbar region: Secondary | ICD-10-CM | POA: Diagnosis present

## 2020-08-21 DIAGNOSIS — G894 Chronic pain syndrome: Secondary | ICD-10-CM | POA: Diagnosis present

## 2020-08-21 DIAGNOSIS — M533 Sacrococcygeal disorders, not elsewhere classified: Secondary | ICD-10-CM | POA: Insufficient documentation

## 2020-08-21 DIAGNOSIS — M069 Rheumatoid arthritis, unspecified: Secondary | ICD-10-CM | POA: Insufficient documentation

## 2020-08-21 DIAGNOSIS — Z79891 Long term (current) use of opiate analgesic: Secondary | ICD-10-CM | POA: Diagnosis present

## 2020-08-21 DIAGNOSIS — Z0289 Encounter for other administrative examinations: Secondary | ICD-10-CM | POA: Diagnosis present

## 2020-08-21 MED ORDER — METHADONE HCL 5 MG PO TABS: 5.0000 mg | ORAL_TABLET | Freq: Three times a day (TID) | ORAL | 0 refills | Status: AC

## 2020-08-21 MED ORDER — METHADONE HCL 5 MG PO TABS: 5.0000 mg | ORAL_TABLET | Freq: Three times a day (TID) | ORAL | 0 refills | Status: DC

## 2020-08-21 NOTE — Progress Notes (Signed)
PROVIDER NOTE: Information contained herein reflects review and annotations entered in association with encounter. Interpretation of such information and data should be left to medically-trained personnel. Information provided to patient can be located elsewhere in the medical record under "Patient Instructions". Document created using STT-dictation technology, any transcriptional errors that may result from process are unintentional.    Patient: Natasha Gonzales  Service Category: E/M  Provider: Gillis Santa, MD  DOB: [birth year]  DOS: 08/21/2020  Specialty: Interventional Pain Management  MRN: [id number]  Setting: Ambulatory outpatient  PCP: Cletis Athens, MD  Type: Established Patient    Referring Provider: Cletis Athens, MD  Location: Office  Delivery: Face-to-face     HPI  Natasha Gonzales, a 65 y.o. year old female, is here today because of her Rheumatoid arthritis involving multiple sites, unspecified whether rheumatoid factor present ([place]) [M06.9]. Natasha Gonzales primary complain today is Back Pain (lower) Last encounter: My last encounter with her was on 05/24/2020. Pertinent problems: Natasha Gonzales has Anxiety; Essential hypertension; Rheumatoid arthritis involving multiple sites with positive rheumatoid factor ([place]); Chronic pain syndrome; Lumbar degenerative disc disease; Sacroiliac joint pain; Encounter for long-term methadone use for pain control; and Pain management contract signed on their pertinent problem list. Pain Assessment: Severity of Chronic pain is reported as a 2 /10. Location: Back Lower/around to lower abdomen and down legs bilat to include all toes. Onset: More than a month ago. Quality: Shooting. Timing: Constant. Modifying factor(s): meds. Vitals:  height is '5\' 2"'  (1.575 m) and weight is 190 lb (86.2 kg). Her temporal temperature is 97.7 F (36.5 C). Her blood pressure is 141/72 (abnormal) and her pulse is 71. Her respiration is 18 and oxygen saturation is 100%.   Reason for  encounter: medication management.    Patient is sad that she had to transfer her mother to a skilled nursing facility.  She did not have much to say today.  No change in medical history since last visit.  Patient's pain is at baseline.  Patient continues multimodal pain regimen as prescribed.  States that it provides pain relief and improvement in functional status.   Pharmacotherapy Assessment   Analgesic: Methadone 5 mg TID prn #90/month, MME= 45   Monitoring: [place] PMP: PDMP not reviewed this encounter.       Pharmacotherapy: No side-effects or adverse reactions reported. Compliance: No problems identified. Effectiveness: Clinically acceptable.  Coretha, [place], RN  08/21/2020  9:28 AM  Sign when Signing Visit Nursing Pain Medication Assessment:  Safety precautions to be maintained throughout the outpatient stay will include: orient to surroundings, keep bed in low position, maintain call bell within reach at all times, provide assistance with transfer out of bed and ambulation.  Medication Inspection Compliance: Pill count conducted under aseptic conditions, in front of the patient. Neither the pills nor the bottle was removed from the patient's sight at any time. Once count was completed pills were immediately returned to the patient in their original bottle.  Medication: Methadone Pill/Patch Count: 22 of 90 pills remain Pill/Patch Appearance: Markings consistent with prescribed medication Bottle Appearance: Standard pharmacy container. Clearly labeled. Filled Date: 2 / 7 / 22 Last Medication intake:  Today    UDS:  Summary  Date Value Ref Range Status  11/08/2019 Note  Final    Comment:    ==================================================================== Compliance Drug Analysis, Ur ==================================================================== Test  Result       Flag       Units Drug Present   Methadone                      2414                     ng/mg creat   EDDP (Methadone Mtb)           1698                    ng/mg creat    Sources of methadone include scheduled prescription medications.    EDDP is an expected metabolite of methadone.   Acetaminophen                  PRESENT ==================================================================== Test                      Result    Flag   Units      Ref Range   Creatinine              189              mg/dL      >=20 ==================================================================== Declared Medications:  Medication list was not provided. ==================================================================== For clinical consultation, please call [phone]. ====================================================================      ROS  Constitutional: Denies any fever or chills Gastrointestinal: No reported hemesis, hematochezia, vomiting, or acute GI distress Musculoskeletal: Denies any acute onset joint swelling, redness, loss of ROM, or weakness Neurological: No reported episodes of acute onset apraxia, aphasia, dysarthria, agnosia, amnesia, paralysis, loss of coordination, or loss of consciousness  Medication Review  aspirin EC, fluticasone, hydroxychloroquine, ibuprofen, lisinopril, methadone, multivitamin, pseudoephedrine, and rosuvastatin  History Review  Allergy: Natasha Gonzales has No Known Allergies. Drug: Natasha Gonzales  reports no history of drug use. Alcohol:  reports previous alcohol use. Tobacco:  reports that she has been smoking cigarettes. She has smoked for the past 25.00 years. She has never used smokeless tobacco. Social: Natasha Gonzales  reports that she has been smoking cigarettes. She has smoked for the past 25.00 years. She has never used smokeless tobacco. She reports previous alcohol use. She reports that she does not use drugs. Medical:  has a past medical history of Anxiety, Depression, Generalized headaches, Hyperlipidemia, Hypertension, Ovarian cyst,  and Rheumatoid arthritis ([place]). Surgical: Natasha Gonzales  has a past surgical history that includes Tonsilectomy/adenoidectomy with myringotomy (1984); Abdominal hysterectomy (1993); adhesion (2001); Ovarian cyst removal; and Joint replacement. Family: family history includes Cancer in her brother and another family member; Heart disease in her father and mother; Hypertension in her mother; Kidney disease in her mother.  Laboratory Chemistry Profile   Renal Lab Results  Component Value Date   BUN 13 01/24/2014   CREATININE 1.05 01/24/2014   GFRAA >60 01/24/2014   GFRNONAA 59 (L) 01/24/2014     Hepatic Lab Results  Component Value Date   AST 22 01/24/2014   ALT 30 01/24/2014   ALBUMIN 4.2 01/24/2014   ALKPHOS 74 01/24/2014   LIPASE 129 01/24/2014     Electrolytes Lab Results  Component Value Date   NA 138 01/24/2014   K 4.0 01/24/2014   CL 104 01/24/2014   CALCIUM 8.9 01/24/2014     Bone No results found for: VD25OH, VD125OH2TOT, RS8546EV0, JJ0093GH8, 25OHVITD1, 25OHVITD2, 25OHVITD3, TESTOFREE, TESTOSTERONE   Inflammation (CRP: Acute Phase) (ESR: Chronic Phase) No results found  for: CRP, ESRSEDRATE, LATICACIDVEN     Note: Above Lab results reviewed.  Recent Imaging Review  MM 3D SCREEN BREAST BILATERAL CLINICAL DATA:  Screening.  EXAM: DIGITAL SCREENING BILATERAL MAMMOGRAM WITH TOMO AND CAD  COMPARISON:  Previous exam(s).  ACR Breast Density Category b: There are scattered areas of fibroglandular density.  FINDINGS: There are no findings suspicious for malignancy. Images were processed with CAD.  IMPRESSION: No mammographic evidence of malignancy. A result letter of this screening mammogram will be mailed directly to the patient.  RECOMMENDATION: Screening mammogram in one year. (Code:SM-B-01Y)  BI-RADS CATEGORY  1: Negative.  Electronically Signed   By: Margarette Canada M.D.   On: 04/05/2019 14:47 Note: Reviewed        Physical Exam  General appearance:  Well nourished, well developed, and well hydrated. In no apparent acute distress Mental status: Alert, oriented x 3 (person, place, & time)       Respiratory: No evidence of acute respiratory distress Eyes: PERLA Vitals: BP (!) 141/72   Pulse 71   Temp 97.7 F (36.5 C) (Temporal)   Resp 18   Ht '5\' 2"'  (1.575 m)   Wt 190 lb (86.2 kg)   SpO2 100%   BMI 34.75 kg/m  BMI: Estimated body mass index is 34.75 kg/m as calculated from the following:   Height as of this encounter: '5\' 2"'  (1.575 m).   Weight as of this encounter: 190 lb (86.2 kg). Ideal: Ideal body weight: 50.1 kg (110 lb 7.2 oz) Adjusted ideal body weight: 64.5 kg (142 lb 4.3 oz)  Skin & Axial Inspection:No masses, redness, or swelling Alignment:Symmetrical Functional VWU:[id number] ROM Stability:No instability detected Muscle Tone/Strength:Functionally intact. No obvious neuro-muscular anomalies detected. Sensory (Neurological):Musculoskeletal pain pattern  Gait & Posture Assessment  Ambulation:Unassisted Gait:Relatively normal for age and body habitus Posture:WNL  Lower Extremity Exam    Side:Right lower extremity  Side:Left lower extremity  Stability:No instability observed  Stability:No instability observed  Skin & Extremity Inspection:Skin color, temperature, and hair growth are WNL. No peripheral edema or cyanosis. No masses, redness, swelling, asymmetry, or associated skin lesions. No contractures.  Skin & Extremity Inspection:Skin color, temperature, and hair growth are WNL. No peripheral edema or cyanosis. No masses, redness, swelling, asymmetry, or associated skin lesions. No contractures.  Functional AOZ:HYQMVHQIONGE ROM   Functional XBM:WUXLKGMWNUUV ROM   Muscle Tone/Strength:Functionally intact. No obvious neuro-muscular anomalies detected.  Muscle Tone/Strength:Functionally intact. No obvious neuro-muscular anomalies detected.   Sensory (Neurological):Musculoskeletal pain pattern  Sensory (Neurological):Musculoskeletal pain pattern  DTR: Patellar:deferred today Achilles:deferred today Plantar:deferred today  DTR: Patellar:deferred today Achilles:deferred today Plantar:deferred today  Palpation:No palpable anomalies  Palpation:No palpable anomalies    Assessment   Status Diagnosis  Controlled Controlled Controlled 1. Rheumatoid arthritis involving multiple sites, unspecified whether rheumatoid factor present ([place])   2. Chronic pain syndrome   3. Lumbar degenerative disc disease   4. Sacroiliac joint pain   5. Pain management contract signed   6. Encounter for long-term methadone use for pain control       Plan of Care   Natasha Gonzales has a current medication list which includes the following long-term medication(s): fluticasone, lisinopril, and rosuvastatin.  Pharmacotherapy (Medications Ordered): Meds ordered this encounter  Medications  . methadone (DOLOPHINE) 5 MG tablet    Sig: Take 1 tablet (5 mg total) by mouth every 8 (eight) hours. Must last 30 days.    Dispense:  90 tablet    Refill:  0    [place] STOP  ACT - Not applicable. Fill one day early if pharmacy is closed on scheduled refill date.  . methadone (DOLOPHINE) 5 MG tablet    Sig: Take 1 tablet (5 mg total) by mouth every 8 (eight) hours. Must last 30 days.    Dispense:  90 tablet    Refill:  0    [place] STOP ACT - Not applicable. Fill one day early if pharmacy is closed on scheduled refill date.  . methadone (DOLOPHINE) 5 MG tablet    Sig: Take 1 tablet (5 mg total) by mouth every 8 (eight) hours. Must last 30 days.    Dispense:  90 tablet    Refill:  0    [place] STOP ACT - Not applicable. Fill one day early if pharmacy is closed on scheduled refill date.   Follow-up plan:   Return in about 3 months (around 11/21/2020) for Medication Management, in person.    Recent Visits Date Type Provider Dept  05/24/20  Office Visit Gillis Santa, MD [facility]  Showing recent visits within past 90 days and meeting all other requirements Today's Visits Date Type Provider Dept  08/21/20 Office Visit Gillis Santa, MD [facility]  Showing today's visits and meeting all other requirements Future Appointments Date Type Provider Dept  11/13/20 Appointment Gillis Santa, MD [facility]  Showing future appointments within next 90 days and meeting all other requirements  I discussed the assessment and treatment plan with the patient. The patient was provided an opportunity to ask questions and all were answered. The patient agreed with the plan and demonstrated an understanding of the instructions.  Patient advised to call back or seek an in-person evaluation if the symptoms or condition worsens.  Duration of encounter: 30 minutes.  Note by: Gillis Santa, MD Date: 08/21/2020; Time: 9:59 AM

## 2020-08-21 NOTE — Patient Instructions (Signed)
Methadone to last until 11/27/20 has been escribed to your pharmacy.

## 2020-08-21 NOTE — Progress Notes (Signed)
Nursing Pain Medication Assessment:  Safety precautions to be maintained throughout the outpatient stay will include: orient to surroundings, keep bed in low position, maintain call bell within reach at all times, provide assistance with transfer out of bed and ambulation.  Medication Inspection Compliance: Pill count conducted under aseptic conditions, in front of the patient. Neither the pills nor the bottle was removed from the patient's sight at any time. Once count was completed pills were immediately returned to the patient in their original bottle.  Medication: Methadone Pill/Patch Count: 22 of 90 pills remain Pill/Patch Appearance: Markings consistent with prescribed medication Bottle Appearance: Standard pharmacy container. Clearly labeled. Filled Date: 2 / 7 / 22 Last Medication intake:  Today

## 2020-11-13 ENCOUNTER — Ambulatory Visit
Payer: Managed Care, Other (non HMO) | Attending: Student in an Organized Health Care Education/Training Program | Admitting: Student in an Organized Health Care Education/Training Program

## 2020-11-13 ENCOUNTER — Encounter: Payer: Self-pay | Admitting: Student in an Organized Health Care Education/Training Program

## 2020-11-13 ENCOUNTER — Other Ambulatory Visit: Payer: Self-pay

## 2020-11-13 VITALS — BP 142/79 | HR 71 | Temp 96.9°F | Resp 14 | Ht 62.0 in | Wt 185.0 lb

## 2020-11-13 DIAGNOSIS — M5136 Other intervertebral disc degeneration, lumbar region: Secondary | ICD-10-CM | POA: Insufficient documentation

## 2020-11-13 DIAGNOSIS — G894 Chronic pain syndrome: Secondary | ICD-10-CM | POA: Diagnosis present

## 2020-11-13 DIAGNOSIS — M069 Rheumatoid arthritis, unspecified: Secondary | ICD-10-CM | POA: Insufficient documentation

## 2020-11-13 DIAGNOSIS — Z0289 Encounter for other administrative examinations: Secondary | ICD-10-CM

## 2020-11-13 DIAGNOSIS — M533 Sacrococcygeal disorders, not elsewhere classified: Secondary | ICD-10-CM | POA: Diagnosis present

## 2020-11-13 MED ORDER — METHADONE HCL 5 MG PO TABS: 5.0000 mg | ORAL_TABLET | Freq: Three times a day (TID) | ORAL | 0 refills | Status: DC

## 2020-11-13 MED ORDER — METHADONE HCL 5 MG PO TABS: 5.0000 mg | ORAL_TABLET | Freq: Three times a day (TID) | ORAL | 0 refills | Status: AC

## 2020-11-13 NOTE — Progress Notes (Signed)
Nursing Pain Medication Assessment:  Safety precautions to be maintained throughout the outpatient stay will include: orient to surroundings, keep bed in low position, maintain call bell within reach at all times, provide assistance with transfer out of bed and ambulation.  Medication Inspection Compliance: Pill count conducted under aseptic conditions, in front of the patient. Neither the pills nor the bottle was removed from the patient's sight at any time. Once count was completed pills were immediately returned to the patient in their original bottle.  Medication: Methadone Pill/Patch Count: 41 of 90 pills remain Pill/Patch Appearance: Markings consistent with prescribed medication Bottle Appearance: Standard pharmacy container. Clearly labeled. Filled Date:10/29/2020 Last Medication intake:  Today

## 2020-11-13 NOTE — Progress Notes (Signed)
PROVIDER NOTE: Information contained herein reflects review and annotations entered in association with encounter. Interpretation of such information and data should be left to medically-trained personnel. Information provided to patient can be located elsewhere in the medical record under "Patient Instructions". Document created using STT-dictation technology, any transcriptional errors that may result from process are unintentional.    Patient: Natasha Gonzales  Service Category: E/M  Provider: Gillis Santa, MD  DOB: 05-14-1956  DOS: 11/13/2020  Specialty: Interventional Pain Management  MRN: [id number]  Setting: Ambulatory outpatient  PCP: Cletis Athens, MD  Type: Established Patient    Referring Provider: Cletis Athens, MD  Location: Office  Delivery: Face-to-face     HPI  [name], a 65 y.o. year old female, is here today because of her Rheumatoid arthritis involving multiple sites, unspecified whether rheumatoid factor present ([place]) [M06.9]. Natasha Gonzales primary complain today is Back Pain (lower) Last encounter: My last encounter with her was on 08/21/20 Pertinent problems: Natasha Gonzales has Anxiety; Essential hypertension; Rheumatoid arthritis involving multiple sites with positive rheumatoid factor ([place]); Chronic pain syndrome; Lumbar degenerative disc disease; Sacroiliac joint pain; Encounter for long-term methadone use for pain control; and Pain management contract signed on their pertinent problem list. Pain Assessment: Severity of Chronic pain is reported as a 2 /10. Location: Back Lower/both legs to the feet. Onset: More than a month ago. Quality: Sharp. Timing: Constant. Modifying factor(s): moving, changing positions, heat, ice, accupuncture. Vitals:  height is 5' 2" (1.575 m) and weight is 185 lb (83.9 kg). Her temporal temperature is 96.9 F (36.1 C) (abnormal). Her blood pressure is 142/79 (abnormal) and her pulse is 71. Her respiration is 14 and oxygen saturation is 98%.   Reason  for encounter: medication management.    Overall doing well. No change in medical history since last visit.  Patient's pain is at baseline.  Patient continues multimodal pain regimen as prescribed.  States that it provides pain relief and improvement in functional status.  Pharmacotherapy Assessment   Analgesic: Methadone 5 mg TID prn #90/month, MME= 45   Monitoring: [place] PMP: PDMP reviewed during this encounter.       Pharmacotherapy: No side-effects or adverse reactions reported. Compliance: No problems identified. Effectiveness: Clinically acceptable.  Landis Martins, RN  11/13/2020  9:27 AM  Sign when Signing Visit Nursing Pain Medication Assessment:  Safety precautions to be maintained throughout the outpatient stay will include: orient to surroundings, keep bed in low position, maintain call bell within reach at all times, provide assistance with transfer out of bed and ambulation.  Medication Inspection Compliance: Pill count conducted under aseptic conditions, in front of the patient. Neither the pills nor the bottle was removed from the patient's sight at any time. Once count was completed pills were immediately returned to the patient in their original bottle.  Medication: Methadone Pill/Patch Count: 41 of 90 pills remain Pill/Patch Appearance: Markings consistent with prescribed medication Bottle Appearance: Standard pharmacy container. Clearly labeled. Filled Date:10/29/2020 Last Medication intake:  Today    UDS:  Summary  Date Value Ref Range Status  11/08/2019 Note  Final    Comment:    ==================================================================== Compliance Drug Analysis, Ur ==================================================================== Test                             Result       Flag       Units Drug Present   Methadone  2414                    ng/mg creat   EDDP (Methadone Mtb)           1698                    ng/mg creat     Sources of methadone include scheduled prescription medications.    EDDP is an expected metabolite of methadone.   Acetaminophen                  PRESENT ==================================================================== Test                      Result    Flag   Units      Ref Range   Creatinine              189              mg/dL      >=20 ==================================================================== Declared Medications:  Medication list was not provided. ==================================================================== For clinical consultation, please call [phone]. ====================================================================      ROS  Constitutional: Denies any fever or chills Gastrointestinal: No reported hemesis, hematochezia, vomiting, or acute GI distress Musculoskeletal: Denies any acute onset joint swelling, redness, loss of ROM, or weakness Neurological: No reported episodes of acute onset apraxia, aphasia, dysarthria, agnosia, amnesia, paralysis, loss of coordination, or loss of consciousness  Medication Review  Turmeric, aspirin EC, fluticasone, hydroxychloroquine, ibuprofen, lisinopril, methadone, multivitamin, pseudoephedrine, and rosuvastatin  History Review  Allergy: Natasha Gonzales has No Known Allergies. Drug: Natasha Gonzales  reports no history of drug use. Alcohol:  reports previous alcohol use. Tobacco:  reports that she has been smoking cigarettes. She has smoked for the past 25.00 years. She has never used smokeless tobacco. Social: Natasha Gonzales  reports that she has been smoking cigarettes. She has smoked for the past 25.00 years. She has never used smokeless tobacco. She reports previous alcohol use. She reports that she does not use drugs. Medical:  has a past medical history of Anxiety, Depression, Generalized headaches, Hyperlipidemia, Hypertension, Ovarian cyst, and Rheumatoid arthritis ([place]). Surgical: Natasha Gonzales  has a past surgical  history that includes Tonsilectomy/adenoidectomy with myringotomy (1984); Abdominal hysterectomy (1993); adhesion (2001); Ovarian cyst removal; and Joint replacement. Family: family history includes Cancer in her brother and another family member; Heart disease in her father and mother; Hypertension in her mother; Kidney disease in her mother.  Laboratory Chemistry Profile   Renal Lab Results  Component Value Date   BUN 13 01/24/2014   CREATININE 1.05 01/24/2014   GFRAA >60 01/24/2014   GFRNONAA 59 (L) 01/24/2014     Hepatic Lab Results  Component Value Date   AST 22 01/24/2014   ALT 30 01/24/2014   ALBUMIN 4.2 01/24/2014   ALKPHOS 74 01/24/2014   LIPASE 129 01/24/2014     Electrolytes Lab Results  Component Value Date   NA 138 01/24/2014   K 4.0 01/24/2014   CL 104 01/24/2014   CALCIUM 8.9 01/24/2014     Bone No results found for: VD25OH, VD125OH2TOT, [id number], EX9371IR6, 25OHVITD1, 25OHVITD2, 25OHVITD3, TESTOFREE, TESTOSTERONE   Inflammation (CRP: Acute Phase) (ESR: Chronic Phase) No results found for: CRP, ESRSEDRATE, LATICACIDVEN     Note: Above Lab results reviewed.  Recent Imaging Review  MM 3D SCREEN BREAST BILATERAL CLINICAL DATA:  Screening.  EXAM: DIGITAL SCREENING BILATERAL MAMMOGRAM WITH TOMO AND CAD  COMPARISON:  Previous exam(s).  ACR Breast Density Category b: There are scattered areas of fibroglandular density.  FINDINGS: There are no findings suspicious for malignancy. Images were processed with CAD.  IMPRESSION: No mammographic evidence of malignancy. A result letter of this screening mammogram will be mailed directly to the patient.  RECOMMENDATION: Screening mammogram in one year. (Code:SM-B-01Y)  BI-RADS CATEGORY  1: Negative.  Electronically Signed   By: Margarette Canada M.D.   On: 04/05/2019 14:47 Note: Reviewed        Physical Exam  General appearance: Well nourished, well developed, and well hydrated. In no apparent acute  distress Mental status: Alert, oriented x 3 (person, place, & time)       Respiratory: No evidence of acute respiratory distress Eyes: PERLA Vitals: BP (!) 142/79   Pulse 71   Temp (!) 96.9 F (36.1 C) (Temporal)   Resp 14   Ht 5' 2" (1.575 m)   Wt 185 lb (83.9 kg)   SpO2 98%   BMI 33.84 kg/m  BMI: Estimated body mass index is 33.84 kg/m as calculated from the following:   Height as of this encounter: 5' 2" (1.575 m).   Weight as of this encounter: 185 lb (83.9 kg). Ideal: Ideal body weight: 50.1 kg (110 lb 7.2 oz) Adjusted ideal body weight: 63.6 kg (140 lb 4.3 oz)  Skin & Axial Inspection:No masses, redness, or swelling Alignment:Symmetrical Functional DUK:[id number] ROM Stability:No instability detected Muscle Tone/Strength:Functionally intact. No obvious neuro-muscular anomalies detected. Sensory (Neurological):Musculoskeletal pain pattern  Gait & Posture Assessment  Ambulation:Unassisted Gait:Relatively normal for age and body habitus Posture:WNL  Lower Extremity Exam    Side:Right lower extremity  Side:Left lower extremity  Stability:No instability observed  Stability:No instability observed  Skin & Extremity Inspection:Skin color, temperature, and hair growth are WNL. No peripheral edema or cyanosis. No masses, redness, swelling, asymmetry, or associated skin lesions. No contractures.  Skin & Extremity Inspection:Skin color, temperature, and hair growth are WNL. No peripheral edema or cyanosis. No masses, redness, swelling, asymmetry, or associated skin lesions. No contractures.  Functional EGB:[id number] ROM   Functional GGY:[id number] ROM   Muscle Tone/Strength:Functionally intact. No obvious neuro-muscular anomalies detected.  Muscle Tone/Strength:Functionally intact. No obvious neuro-muscular anomalies detected.  Sensory (Neurological):Musculoskeletal pain pattern  Sensory  (Neurological):Musculoskeletal pain pattern  DTR: Patellar:deferred today Achilles:deferred today Plantar:deferred today  DTR: Patellar:deferred today Achilles:deferred today Plantar:deferred today  Palpation:No palpable anomalies  Palpation:No palpable anomalies   Assessment   Status Diagnosis  Controlled Controlled Controlled 1. Rheumatoid arthritis involving multiple sites, unspecified whether rheumatoid factor present ([place])   2. Chronic pain syndrome   3. Lumbar degenerative disc disease   4. Sacroiliac joint pain   5. Pain management contract signed       Plan of Care   Natasha Gonzales has a current medication list which includes the following long-term medication(s): fluticasone, lisinopril, and rosuvastatin.  Pharmacotherapy (Medications Ordered): Meds ordered this encounter  Medications  . methadone (DOLOPHINE) 5 MG tablet    Sig: Take 1 tablet (5 mg total) by mouth every 8 (eight) hours. Must last 30 days.    Dispense:  90 tablet    Refill:  0    [place] STOP ACT - Not applicable. Fill one day early if pharmacy is closed on scheduled refill date.  . methadone (DOLOPHINE) 5 MG tablet    Sig: Take 1 tablet (5 mg total) by mouth every 8 (eight) hours. Must last 30 days.    Dispense:  90 tablet  Refill:  0    [place] STOP ACT - Not applicable. Fill one day early if pharmacy is closed on scheduled refill date.  . methadone (DOLOPHINE) 5 MG tablet    Sig: Take 1 tablet (5 mg total) by mouth every 8 (eight) hours. Must last 30 days.    Dispense:  90 tablet    Refill:  0    [place] STOP ACT - Not applicable. Fill one day early if pharmacy is closed on scheduled refill date.   Follow-up plan:   Return in about 3 months (around 02/21/2021) for Medication Management, in person.    Recent Visits Date Type Provider Dept  08/21/20 Office Visit Gillis Santa, MD [facility]  Showing recent visits within past 90 days and meeting all other  requirements Today's Visits Date Type Provider Dept  11/13/20 Office Visit Gillis Santa, MD [facility]  Showing today's visits and meeting all other requirements Future Appointments No visits were found meeting these conditions. Showing future appointments within next 90 days and meeting all other requirements  I discussed the assessment and treatment plan with the patient. The patient was provided an opportunity to ask questions and all were answered. The patient agreed with the plan and demonstrated an understanding of the instructions.  Patient advised to call back or seek an in-person evaluation if the symptoms or condition worsens.  Duration of encounter: 30 minutes.  Note by: Gillis Santa, MD Date: 11/13/2020; Time: 9:50 AM

## 2020-11-21 LAB — TOXASSURE SELECT 13 (MW), URINE

## 2021-02-01 ENCOUNTER — Other Ambulatory Visit: Payer: Self-pay | Admitting: Internal Medicine

## 2021-02-19 ENCOUNTER — Encounter: Payer: Self-pay | Admitting: Student in an Organized Health Care Education/Training Program

## 2021-02-19 ENCOUNTER — Other Ambulatory Visit: Payer: Self-pay | Admitting: Internal Medicine

## 2021-02-19 ENCOUNTER — Other Ambulatory Visit: Payer: Self-pay

## 2021-02-19 ENCOUNTER — Ambulatory Visit
Payer: Managed Care, Other (non HMO) | Attending: Student in an Organized Health Care Education/Training Program | Admitting: Student in an Organized Health Care Education/Training Program

## 2021-02-19 VITALS — BP 182/74 | HR 64 | Temp 97.1°F | Resp 16 | Ht 62.0 in | Wt 190.0 lb

## 2021-02-19 DIAGNOSIS — Z0289 Encounter for other administrative examinations: Secondary | ICD-10-CM | POA: Diagnosis present

## 2021-02-19 DIAGNOSIS — M51369 Other intervertebral disc degeneration, lumbar region without mention of lumbar back pain or lower extremity pain: Secondary | ICD-10-CM

## 2021-02-19 DIAGNOSIS — M5136 Other intervertebral disc degeneration, lumbar region: Secondary | ICD-10-CM | POA: Diagnosis present

## 2021-02-19 DIAGNOSIS — M069 Rheumatoid arthritis, unspecified: Secondary | ICD-10-CM

## 2021-02-19 DIAGNOSIS — Z79891 Long term (current) use of opiate analgesic: Secondary | ICD-10-CM

## 2021-02-19 DIAGNOSIS — G894 Chronic pain syndrome: Secondary | ICD-10-CM

## 2021-02-19 MED ORDER — METHADONE HCL 5 MG PO TABS: 5.0000 mg | ORAL_TABLET | Freq: Three times a day (TID) | ORAL | 0 refills | Status: DC

## 2021-02-19 MED ORDER — METHADONE HCL 5 MG PO TABS: 5.0000 mg | ORAL_TABLET | Freq: Three times a day (TID) | ORAL | 0 refills | Status: AC

## 2021-02-19 NOTE — Progress Notes (Signed)
PROVIDER NOTE: Information contained herein reflects review and annotations entered in association with encounter. Interpretation of such information and data should be left to medically-trained personnel. Information provided to patient can be located elsewhere in the medical record under "Patient Instructions". Document created using STT-dictation technology, any transcriptional errors that may result from process are unintentional.    Patient: Natasha Gonzales  Service Category: E/M  Provider: Gillis Santa, MD  DOB: 12-Jan-1956  DOS: 02/19/2021  Specialty: Interventional Pain Management  MRN: [id number]  Setting: Ambulatory outpatient  PCP: Cletis Athens, MD  Type: Established Patient    Referring Provider: Cletis Athens, MD  Location: Office  Delivery: Face-to-face     HPI  Natasha Gonzales, a 65 y.o. year old female, is here today because of her Rheumatoid arthritis involving multiple sites, unspecified whether rheumatoid factor present ([place]) [M06.9]. Natasha Gonzales primary complain today is Back Pain (Right side) Last encounter: My last encounter with her was on 11/13/20 Pertinent problems: Natasha Gonzales has Anxiety; Essential hypertension; Rheumatoid arthritis involving multiple sites with positive rheumatoid factor ([place]); Chronic pain syndrome; Lumbar degenerative disc disease; Sacroiliac joint pain; Encounter for long-term methadone use for pain control; and Pain management contract signed on their pertinent problem list. Pain Assessment: Severity of Chronic pain is reported as a 2 /10. Location: Back Lower, Right/radiates around to front and down right leg to foot. Onset: More than a month ago. Quality: [place] Ard, Aching. Timing: Constant. Modifying factor(s): heat, rest. Vitals:  height is _0  (1.575 m) and weight is 190 lb (86.2 kg). Her temporal temperature is 97.1 F (36.2 C) (abnormal). Her blood pressure is 182/74 (abnormal) and her pulse is 64. Her respiration is 16 and oxygen saturation is 98%.    Reason for encounter: medication management.    Overall doing well. No change in medical history since last visit.  Patient's pain is at baseline.  Patient continues multimodal pain regimen as prescribed.  States that it provides pain relief and improvement in functional status. States that she is out of her lisinopril and for this reason her blood pressures have been elevated.  I instructed her to contact her PCP for lisinopril refill.  Her systolic this morning was 182.  She was asymptomatic.   Pharmacotherapy Assessment  Analgesic: Methadone 5 mg TID prn #90/month, MME= 45   Monitoring: [place] PMP: PDMP reviewed during this encounter.       Pharmacotherapy: No side-effects or adverse reactions reported. Compliance: No problems identified. Effectiveness: Clinically acceptable.  UDS:  Summary  Date Value Ref Range Status  11/13/2020 Note  Final    Comment:    ==================================================================== [employer] [facility] (MW) ==================================================================== Test                             Result       Flag       Units  Drug Present and Declared for Prescription Verification   Methadone                      1569         EXPECTED   ng/mg creat   EDDP (Methadone Mtb)           2820         EXPECTED   ng/mg creat    Sources of methadone include scheduled prescription medications.    EDDP is an expected metabolite of methadone.  ==================================================================== Test  Result    Flag   Units      Ref Range   Creatinine              159              mg/dL      >=20 ==================================================================== Declared Medications:  The flagging and interpretation on this report are based on the  following declared medications.  Unexpected results may arise from  inaccuracies in the declared medications.   **Note: The testing scope of this panel  includes these medications:   Methadone   **Note: The testing scope of this panel does not include the  following reported medications:   Aspirin  Fluticasone (Flonase)  Hydroxychloroquine (Plaquenil)  Ibuprofen (Advil)  Lisinopril  Multivitamin  Pseudoephedrine (Sudafed)  Rosuvastatin  Turmeric ==================================================================== For clinical consultation, please call [phone]. ====================================================================       ROS  Constitutional: Denies any fever or chills Gastrointestinal: No reported hemesis, hematochezia, vomiting, or acute GI distress Musculoskeletal: Denies any acute onset joint swelling, redness, loss of ROM, or weakness Neurological: No reported episodes of acute onset apraxia, aphasia, dysarthria, agnosia, amnesia, paralysis, loss of coordination, or loss of consciousness  Medication Review  Turmeric, aspirin EC, fluticasone, hydroxychloroquine, ibuprofen, leflunomide, lisinopril, methadone, multivitamin, pseudoephedrine, and rosuvastatin  History Review  Allergy: Natasha Gonzales has No Known Allergies. Drug: Natasha Gonzales  reports no history of drug use. Alcohol:  reports that she does not currently use alcohol. Tobacco:  reports that she has been smoking cigarettes. She has never used smokeless tobacco. Social: Natasha Gonzales  reports that she has been smoking cigarettes. She has never used smokeless tobacco. She reports that she does not currently use alcohol. She reports that she does not use drugs. Medical:  has a past medical history of Anxiety, Depression, Generalized headaches, Hyperlipidemia, Hypertension, Ovarian cyst, and Rheumatoid arthritis ([place]). Surgical: Natasha Gonzales  has a past surgical history that includes Tonsilectomy/adenoidectomy with myringotomy (1984); Abdominal hysterectomy (1993); adhesion (2001); Ovarian cyst removal; and Joint replacement. Family: family history includes  Cancer in her brother and another family member; Heart disease in her father and mother; Hypertension in her mother; Kidney disease in her mother.  Laboratory Chemistry Profile   Renal Lab Results  Component Value Date   BUN 13 01/24/2014   CREATININE 1.05 01/24/2014   GFRAA >60 01/24/2014   GFRNONAA 59 (L) 01/24/2014     Hepatic Lab Results  Component Value Date   AST 22 01/24/2014   ALT 30 01/24/2014   ALBUMIN 4.2 01/24/2014   ALKPHOS 74 01/24/2014   LIPASE 129 01/24/2014     Electrolytes Lab Results  Component Value Date   NA 138 01/24/2014   K 4.0 01/24/2014   CL 104 01/24/2014   CALCIUM 8.9 01/24/2014     Bone No results found for: VD25OH, VD125OH2TOT, SF6812XN1, ZG0174BS4, 25OHVITD1, 25OHVITD2, 25OHVITD3, TESTOFREE, TESTOSTERONE   Inflammation (CRP: Acute Phase) (ESR: Chronic Phase) No results found for: CRP, ESRSEDRATE, LATICACIDVEN     Note: Above Lab results reviewed.  Recent Imaging Review  MM 3D SCREEN BREAST BILATERAL CLINICAL DATA:  Screening.  EXAM: DIGITAL SCREENING BILATERAL MAMMOGRAM WITH TOMO AND CAD  COMPARISON:  Previous exam(s).  ACR Breast Density Category b: There are scattered areas of fibroglandular density.  FINDINGS: There are no findings suspicious for malignancy. Images were processed with CAD.  IMPRESSION: No mammographic evidence of malignancy. A result letter of this screening mammogram will be mailed directly to the patient.  RECOMMENDATION:  Screening mammogram in one year. (Code:SM-B-01Y)  BI-RADS CATEGORY  1: Negative.  Electronically Signed   By: Margarette Canada M.D.   On: 04/05/2019 14:47 Note: Reviewed        Physical Exam  General appearance: Well nourished, well developed, and well hydrated. In no apparent acute distress Mental status: Alert, oriented x 3 (person, place, & time)       Respiratory: No evidence of acute respiratory distress Eyes: PERLA Vitals: BP (!) 182/74 (BP Location: Right Arm, Patient  Position: Sitting, Cuff Size: Normal)   Pulse 64   Temp (!) 97.1 F (36.2 C) (Temporal)   Resp 16   Ht _0  (1.575 m)   Wt 190 lb (86.2 kg)   SpO2 98%   BMI 34.75 kg/m  BMI: Estimated body mass index is 34.75 kg/m as calculated from the following:   Height as of this encounter: _1  (1.575 m).   Weight as of this encounter: 190 lb (86.2 kg). Ideal: Ideal body weight: 50.1 kg (110 lb 7.2 oz) Adjusted ideal body weight: 64.5 kg (142 lb 4.3 oz)  Skin & Axial Inspection: No masses, redness, or swelling Alignment: Symmetrical Functional ROM: Unrestricted ROM       Stability: No instability detected Muscle Tone/Strength: Functionally intact. No obvious neuro-muscular anomalies detected. Sensory (Neurological): Musculoskeletal pain pattern  Gait & Posture Assessment  Ambulation: Unassisted Gait: Relatively normal for age and body habitus Posture: WNL    Lower Extremity Exam      Side: Right lower extremity   Side: Left lower extremity  Stability: No instability observed           Stability: No instability observed          Skin & Extremity Inspection: Skin color, temperature, and hair growth are WNL. No peripheral edema or cyanosis. No masses, redness, swelling, asymmetry, or associated skin lesions. No contractures.   Skin & Extremity Inspection: Skin color, temperature, and hair growth are WNL. No peripheral edema or cyanosis. No masses, redness, swelling, asymmetry, or associated skin lesions. No contractures.  Functional ROM: Unrestricted ROM                   Functional ROM: Unrestricted ROM                  Muscle Tone/Strength: Functionally intact. No obvious neuro-muscular anomalies detected.   Muscle Tone/Strength: Functionally intact. No obvious neuro-muscular anomalies detected.  Sensory (Neurological): Musculoskeletal pain pattern         Sensory (Neurological): Musculoskeletal pain pattern        DTR: Patellar: deferred today Achilles: deferred today Plantar: deferred  today   DTR: Patellar: deferred today Achilles: deferred today Plantar: deferred today  Palpation: No palpable anomalies   Palpation: No palpable anomalies    Assessment   Status Diagnosis  Controlled Controlled Controlled 1. Rheumatoid arthritis involving multiple sites, unspecified whether rheumatoid factor present ([place])   2. Chronic pain syndrome   3. Lumbar degenerative disc disease   4. Encounter for long-term methadone use for pain control   5. Pain management contract signed       Plan of Care   Natasha Gonzales has a current medication list which includes the following long-term medication(s): fluticasone, lisinopril, and rosuvastatin.  Pharmacotherapy (Medications Ordered): Meds ordered this encounter  Medications   methadone (DOLOPHINE) 5 MG tablet    Sig: Take 1 tablet (5 mg total) by mouth every 8 (eight) hours. Must last 30 days.  Dispense:  90 tablet    Refill:  0    [place] STOP ACT - Not applicable. Fill one day early if pharmacy is closed on scheduled refill date.   methadone (DOLOPHINE) 5 MG tablet    Sig: Take 1 tablet (5 mg total) by mouth every 8 (eight) hours. Must last 30 days.    Dispense:  90 tablet    Refill:  0    [place] STOP ACT - Not applicable. Fill one day early if pharmacy is closed on scheduled refill date.   methadone (DOLOPHINE) 5 MG tablet    Sig: Take 1 tablet (5 mg total) by mouth every 8 (eight) hours. Must last 30 days.    Dispense:  90 tablet    Refill:  0    [place] STOP ACT - Not applicable. Fill one day early if pharmacy is closed on scheduled refill date.   Follow-up plan:   Return in about 12 weeks (around 05/14/2021) for Medication Management, in person.    Recent Visits No visits were found meeting these conditions. Showing recent visits within past 90 days and meeting all other requirements Today's Visits Date Type Provider Dept  02/19/21 Office Visit Gillis Santa, MD [facility]  Showing today's visits and meeting  all other requirements Future Appointments Date Type Provider Dept  05/09/21 Appointment Gillis Santa, MD [facility]  Showing future appointments within next 90 days and meeting all other requirements I discussed the assessment and treatment plan with the patient. The patient was provided an opportunity to ask questions and all were answered. The patient agreed with the plan and demonstrated an understanding of the instructions.  Patient advised to call back or seek an in-person evaluation if the symptoms or condition worsens.  Duration of encounter: 30 minutes.  Note by: Gillis Santa, MD Date: 02/19/2021; Time: 11:24 AM

## 2021-02-19 NOTE — Progress Notes (Signed)
Nursing Pain Medication Assessment:  Safety precautions to be maintained throughout the outpatient stay will include: orient to surroundings, keep bed in low position, maintain call bell within reach at all times, provide assistance with transfer out of bed and ambulation.  Medication Inspection Compliance: Pill count conducted under aseptic conditions, in front of the patient. Neither the pills nor the bottle was removed from the patient's sight at any time. Once count was completed pills were immediately returned to the patient in their original bottle.  Medication: Methadone Pill/Patch Count:  20 of 90 pills remain Pill/Patch Appearance: Markings consistent with prescribed medication Bottle Appearance: Standard pharmacy container. Clearly labeled. Filled Date: 08 / 22 / 2022 Last Medication intake:  Today

## 2021-02-21 ENCOUNTER — Encounter: Payer: Managed Care, Other (non HMO) | Admitting: Student in an Organized Health Care Education/Training Program

## 2021-02-26 ENCOUNTER — Other Ambulatory Visit: Payer: Self-pay | Admitting: *Deleted

## 2021-02-26 MED ORDER — LISINOPRIL 20 MG PO TABS: 20.0000 mg | ORAL_TABLET | Freq: Every day | ORAL | 0 refills | Status: AC

## 2021-05-09 ENCOUNTER — Other Ambulatory Visit: Payer: Self-pay

## 2021-05-09 ENCOUNTER — Encounter: Payer: Self-pay | Admitting: Student in an Organized Health Care Education/Training Program

## 2021-05-09 ENCOUNTER — Ambulatory Visit
Payer: Managed Care, Other (non HMO) | Attending: Student in an Organized Health Care Education/Training Program | Admitting: Student in an Organized Health Care Education/Training Program

## 2021-05-09 VITALS — BP 153/72 | HR 98 | Temp 97.3°F | Resp 16 | Ht 62.0 in | Wt 190.0 lb

## 2021-05-09 DIAGNOSIS — G894 Chronic pain syndrome: Secondary | ICD-10-CM | POA: Insufficient documentation

## 2021-05-09 DIAGNOSIS — M533 Sacrococcygeal disorders, not elsewhere classified: Secondary | ICD-10-CM | POA: Diagnosis present

## 2021-05-09 DIAGNOSIS — G8929 Other chronic pain: Secondary | ICD-10-CM | POA: Insufficient documentation

## 2021-05-09 DIAGNOSIS — Z0289 Encounter for other administrative examinations: Secondary | ICD-10-CM | POA: Diagnosis not present

## 2021-05-09 DIAGNOSIS — M069 Rheumatoid arthritis, unspecified: Secondary | ICD-10-CM | POA: Diagnosis present

## 2021-05-09 DIAGNOSIS — Z79891 Long term (current) use of opiate analgesic: Secondary | ICD-10-CM | POA: Insufficient documentation

## 2021-05-09 DIAGNOSIS — M25561 Pain in right knee: Secondary | ICD-10-CM | POA: Insufficient documentation

## 2021-05-09 DIAGNOSIS — M1711 Unilateral primary osteoarthritis, right knee: Secondary | ICD-10-CM | POA: Insufficient documentation

## 2021-05-09 DIAGNOSIS — M5136 Other intervertebral disc degeneration, lumbar region: Secondary | ICD-10-CM

## 2021-05-09 MED ORDER — METHADONE HCL 5 MG PO TABS: 5.0000 mg | ORAL_TABLET | Freq: Three times a day (TID) | ORAL | 0 refills | Status: AC

## 2021-05-09 MED ORDER — METHADONE HCL 5 MG PO TABS: 5.0000 mg | ORAL_TABLET | Freq: Three times a day (TID) | ORAL | 0 refills | Status: DC

## 2021-05-09 NOTE — Patient Instructions (Signed)
Please call if you are intersested in dio

## 2021-05-09 NOTE — Progress Notes (Signed)
PROVIDER NOTE: Information contained herein reflects review and annotations entered in association with encounter. Interpretation of such information and data should be left to medically-trained personnel. Information provided to patient can be located elsewhere in the medical record under "Patient Instructions". Document created using STT-dictation technology, any transcriptional errors that may result from process are unintentional.    Patient: Natasha Gonzales  Service Category: E/M  Provider: Gillis Santa, MD  DOB: 1955/10/26  DOS: 05/09/2021  Specialty: Interventional Pain Management  MRN: [id number]  Setting: Ambulatory outpatient  PCP: Natasha Athens, MD  Type: Established Patient    Referring Provider: Cletis Athens, MD  Location: Office  Delivery: Face-to-face     HPI  [name], a 65 y.o. year old female, is here today because of her Chronic pain syndrome [G89.4]. [name] primary complain today is Back Pain Last encounter: My last encounter with her was on 02/19/21 Pertinent problems: [name] has Anxiety; Essential hypertension; Rheumatoid arthritis involving multiple sites with positive rheumatoid factor ([place]); Chronic pain syndrome; Lumbar degenerative disc disease; Sacroiliac joint pain; Encounter for long-term methadone use for pain control; and Pain management contract signed on their pertinent problem list. Pain Assessment: Severity of Chronic pain is reported as a 3 /10. Location: Back Lower, Right, Left/Radiates from lower back into groin area into inner right leg down side of right leg and right foot. Onset: More than a month ago. Quality: Constant, Sharp, Aching, Discomfort ("Hot feeling"). Timing: Constant. Modifying factor(s): Staying active or cganing postions every 30 mins, heat, hot showers, and methadone. Vitals:  height is 5' 2" (1.575 m) and weight is 190 lb (86.2 kg). Her temporal temperature is 97.3 F (36.3 C) (abnormal). Her blood pressure is 153/72 (abnormal)  and her pulse is 98. Her respiration is 16 and oxygen saturation is 100%.   Reason for encounter: medication management.    Having increased right knee pain, discussed right knee steroid, gel injection, as well as genicular NB Patient continues multimodal pain regimen as prescribed.  States that it provides pain relief and improvement in functional status.   Pharmacotherapy Assessment  Analgesic: Methadone 5 mg TID prn #90/month, MME= 45   Monitoring: [place] PMP: PDMP reviewed during this encounter.       Pharmacotherapy: No side-effects or adverse reactions reported. Compliance: No problems identified. Effectiveness: Clinically acceptable.  UDS:  Summary  Date Value Ref Range Status  11/13/2020 Note  Final    Comment:    ==================================================================== [facility] (MW) ==================================================================== Test                             Result       Flag       Units  Drug Present and Declared for Prescription Verification   Methadone                      1569         EXPECTED   ng/mg creat   EDDP (Methadone Mtb)           2820         EXPECTED   ng/mg creat    Sources of methadone include scheduled prescription medications.    EDDP is an expected metabolite of methadone.  ==================================================================== Test                      Result    Flag  Units      Ref Range   Creatinine              159              mg/dL      >=20 ==================================================================== Declared Medications:  The flagging and interpretation on this report are based on the  following declared medications.  Unexpected results may arise from  inaccuracies in the declared medications.   **Note: The testing scope of this panel includes these medications:   Methadone   **Note: The testing scope of this panel does not include the  following reported  medications:   Aspirin  Fluticasone (Flonase)  Hydroxychloroquine (Plaquenil)  Ibuprofen (Advil)  Lisinopril  Multivitamin  Pseudoephedrine (Sudafed)  Rosuvastatin  Turmeric ==================================================================== For clinical consultation, please call [phone]. ====================================================================       ROS  Constitutional: Denies any fever or chills Gastrointestinal: No reported hemesis, hematochezia, vomiting, or acute GI distress Musculoskeletal:  right knee pain Neurological: No reported episodes of acute onset apraxia, aphasia, dysarthria, agnosia, amnesia, paralysis, loss of coordination, or loss of consciousness  Medication Review  Turmeric, aspirin EC, fluticasone, hydroxychloroquine, ibuprofen, leflunomide, lisinopril, meloxicam, methadone, multivitamin, pseudoephedrine, and rosuvastatin  History Review  Allergy: [name] is allergic to erythromycin base. Drug: [name]  reports no history of drug use. Alcohol:  reports that she does not currently use alcohol. Tobacco:  reports that she has been smoking cigarettes. She has never used smokeless tobacco. Social: [name]  reports that she has been smoking cigarettes. She has never used smokeless tobacco. She reports that she does not currently use alcohol. She reports that she does not use drugs. Medical:  has a past medical history of Anxiety, Depression, Generalized headaches, Hyperlipidemia, Hypertension, Ovarian cyst, and Rheumatoid arthritis ([place]). Surgical: [name]  has a past surgical history that includes Tonsilectomy/adenoidectomy with myringotomy (1984); Abdominal hysterectomy (1993); adhesion (2001); Ovarian cyst removal; and Joint replacement. Family: family history includes Cancer in her brother and another family member; Heart disease in her father and mother; Hypertension in her mother; Kidney disease in her mother.  Laboratory  Chemistry Profile   Renal Lab Results  Component Value Date   BUN 13 01/24/2014   CREATININE 1.05 01/24/2014   GFRAA >60 01/24/2014   GFRNONAA 59 (L) 01/24/2014     Hepatic Lab Results  Component Value Date   AST 22 01/24/2014   ALT 30 01/24/2014   ALBUMIN 4.2 01/24/2014   ALKPHOS 74 01/24/2014   LIPASE 129 01/24/2014     Electrolytes Lab Results  Component Value Date   NA 138 01/24/2014   K 4.0 01/24/2014   CL 104 01/24/2014   CALCIUM 8.9 01/24/2014     Bone No results found for: VD25OH, VD125OH2TOT, JQ7341PF7, TK2409BD5, 25OHVITD1, 25OHVITD2, 25OHVITD3, TESTOFREE, TESTOSTERONE   Inflammation (CRP: Acute Phase) (ESR: Chronic Phase) No results found for: CRP, ESRSEDRATE, LATICACIDVEN     Note: Above Lab results reviewed.  Recent Imaging Review  MM 3D SCREEN BREAST BILATERAL CLINICAL DATA:  Screening.  EXAM: DIGITAL SCREENING BILATERAL MAMMOGRAM WITH TOMO AND CAD  COMPARISON:  Previous exam(s).  ACR Breast Density Category b: There are scattered areas of fibroglandular density.  FINDINGS: There are no findings suspicious for malignancy. Images were processed with CAD.  IMPRESSION: No mammographic evidence of malignancy. A result letter of this screening mammogram will be mailed directly to the patient.  RECOMMENDATION: Screening mammogram in one year. (Code:SM-B-01Y)  BI-RADS CATEGORY  1: Negative.  Electronically Signed   By: Margarette Canada M.D.   On: 04/05/2019 14:47 Note: Reviewed        Physical Exam  General appearance: Well nourished, well developed, and well hydrated. In no apparent acute distress Mental status: Alert, oriented x 3 (person, place, & time)       Respiratory: No evidence of acute respiratory distress Eyes: PERLA Vitals: BP (!) 153/72 (BP Location: Right Arm, Patient Position: Sitting, Cuff Size: Large)   Pulse 98   Temp (!) 97.3 F (36.3 C) (Temporal)   Resp 16   Ht 5' 2" (1.575 m)   Wt 190 lb (86.2 kg)   SpO2 100%   BMI  34.75 kg/m  BMI: Estimated body mass index is 34.75 kg/m as calculated from the following:   Height as of this encounter: 5' 2" (1.575 m).   Weight as of this encounter: 190 lb (86.2 kg). Ideal: Ideal body weight: 50.1 kg (110 lb 7.2 oz) Adjusted ideal body weight: 64.5 kg (142 lb 4.3 oz)  Skin & Axial Inspection: No masses, redness, or swelling Alignment: Symmetrical Functional ROM: Unrestricted ROM       Stability: No instability detected Muscle Tone/Strength: Functionally intact. No obvious neuro-muscular anomalies detected. Sensory (Neurological): Musculoskeletal pain pattern  Gait & Posture Assessment  Ambulation: Unassisted Gait: Relatively normal for age and body habitus Posture: WNL    Lower Extremity Exam      Side: Right lower extremity   Side: Left lower extremity  Stability: No instability observed           Stability: No instability observed          Skin & Extremity Inspection: Skin color, temperature, and hair growth are WNL. No peripheral edema or cyanosis. No masses, redness, swelling, asymmetry, or associated skin lesions. No contractures.   Skin & Extremity Inspection: Skin color, temperature, and hair growth are WNL. No peripheral edema or cyanosis. No masses, redness, swelling, asymmetry, or associated skin lesions. No contractures.  Functional ROM: pain restricted           Functional ROM: Unrestricted ROM                  Muscle Tone/Strength: Functionally intact. No obvious neuro-muscular anomalies detected.   Muscle Tone/Strength: Functionally intact. No obvious neuro-muscular anomalies detected.  Sensory (Neurological): Musculoskeletal pain pattern, severe   Sensory (Neurological): Musculoskeletal pain pattern        DTR: Patellar: deferred today Achilles: deferred today Plantar: deferred today   DTR: Patellar: deferred today Achilles: deferred today Plantar: deferred today  Palpation: No palpable anomalies   Palpation: No palpable anomalies     Assessment   Status Diagnosis  Controlled Having a Flare-up Having a Flare-up 1. Chronic pain syndrome   2. Chronic pain of right knee   3. Primary osteoarthritis of right knee   4. Rheumatoid arthritis involving multiple sites, unspecified whether rheumatoid factor present ([place])   5. Lumbar degenerative disc disease   6. Encounter for long-term methadone use for pain control   7. Pain management contract signed   8. Sacroiliac joint pain        Plan of Care   Ms. TRUDI MORGENTHALER has a current medication list which includes the following long-term medication(s): fluticasone, lisinopril, and rosuvastatin.  For right knee OA, discussed PT, heat, brace (which patient has on), IA steroid, gel injection as well as genicular NB and possible RFA Refill Methadone below, UDS up to date and appropriate  Pharmacotherapy (  Medications Ordered): Meds ordered this encounter  Medications   methadone (DOLOPHINE) 5 MG tablet    Sig: Take 1 tablet (5 mg total) by mouth every 8 (eight) hours. Must last 30 days.    Dispense:  90 tablet    Refill:  0    [place] STOP ACT - Not applicable. Fill one day early if pharmacy is closed on scheduled refill date.   methadone (DOLOPHINE) 5 MG tablet    Sig: Take 1 tablet (5 mg total) by mouth every 8 (eight) hours. Must last 30 days.    Dispense:  90 tablet    Refill:  0    [place] STOP ACT - Not applicable. Fill one day early if pharmacy is closed on scheduled refill date.   methadone (DOLOPHINE) 5 MG tablet    Sig: Take 1 tablet (5 mg total) by mouth every 8 (eight) hours. Must last 30 days.    Dispense:  90 tablet    Refill:  0     STOP ACT - Not applicable. Fill one day early if pharmacy is closed on scheduled refill date.    Follow-up plan:   Return in about 3 months (around 08/09/2021) for Medication Management, in person.    Recent Visits Date Type Provider Dept  02/19/21 Office Visit Natasha Santa, MD [facility]  Showing recent visits  within past 90 days and meeting all other requirements Today's Visits Date Type Provider Dept  05/09/21 Office Visit Natasha Santa, MD [facility]  Showing today's visits and meeting all other requirements Future Appointments No visits were found meeting these conditions. Showing future appointments within next 90 days and meeting all other requirements I discussed the assessment and treatment plan with the patient. The patient was provided an opportunity to ask questions and all were answered. The patient agreed with the plan and demonstrated an understanding of the instructions.  Patient advised to call back or seek an in-person evaluation if the symptoms or condition worsens.  Duration of encounter: 30 minutes.  Note by: Natasha Santa, MD Date: 05/09/2021; Time: 11:30 AM

## 2021-05-09 NOTE — Progress Notes (Signed)
Safety precautions to be maintained throughout the outpatient stay will include: orient to surroundings, keep bed in low position, maintain call bell within reach at all times, provide assistance with transfer out of bed and ambulation.   Nursing Pain Medication Assessment:  Safety precautions to be maintained throughout the outpatient stay will include: orient to surroundings, keep bed in low position, maintain call bell within reach at all times, provide assistance with transfer out of bed and ambulation.  Medication Inspection Compliance: Pill count conducted under aseptic conditions, in front of the patient. Neither the pills nor the bottle was removed from the patient's sight at any time. Once count was completed pills were immediately returned to the patient in their original bottle.  Medication: Methadone Pill/Patch Count:  62 of 90 pills remain Pill/Patch Appearance: Markings consistent with prescribed medication Bottle Appearance: Standard pharmacy container. Clearly labeled. Filled Date: 21 / 7 / 2022 Last Medication intake:  Today

## 2021-05-27 ENCOUNTER — Other Ambulatory Visit
Admission: RE | Admit: 2021-05-27 | Discharge: 2021-05-27 | Disposition: A | Discharge status: Home / Self Care | Payer: Managed Care, Other (non HMO) | Source: Ambulatory Visit | Attending: Rheumatology | Admitting: Rheumatology

## 2021-05-27 DIAGNOSIS — M25561 Pain in right knee: Secondary | ICD-10-CM | POA: Insufficient documentation

## 2021-05-27 LAB — SYNOVIAL CELL COUNT + DIFF, W/ CRYSTALS
Crystals, Fluid: NONE SEEN
Eosinophils-Synovial: 0 %
Lymphocytes-Synovial Fld: 30 %
Monocyte-Macrophage-Synovial Fluid: 60 %
Neutrophil, Synovial: 10 %
WBC, Synovial: 550 /mm3 — ABNORMAL HIGH (ref 0–200)

## 2021-08-01 ENCOUNTER — Ambulatory Visit
Payer: Managed Care, Other (non HMO) | Attending: Student in an Organized Health Care Education/Training Program | Admitting: Student in an Organized Health Care Education/Training Program

## 2021-08-01 ENCOUNTER — Encounter: Payer: Self-pay | Admitting: Student in an Organized Health Care Education/Training Program

## 2021-08-01 ENCOUNTER — Other Ambulatory Visit: Payer: Self-pay

## 2021-08-01 VITALS — BP 114/69 | HR 80 | Temp 98.0°F | Resp 165 | Ht 62.0 in | Wt 188.0 lb

## 2021-08-01 DIAGNOSIS — Z0289 Encounter for other administrative examinations: Secondary | ICD-10-CM | POA: Insufficient documentation

## 2021-08-01 DIAGNOSIS — M069 Rheumatoid arthritis, unspecified: Secondary | ICD-10-CM

## 2021-08-01 DIAGNOSIS — M5136 Other intervertebral disc degeneration, lumbar region: Secondary | ICD-10-CM | POA: Diagnosis present

## 2021-08-01 DIAGNOSIS — M1711 Unilateral primary osteoarthritis, right knee: Secondary | ICD-10-CM | POA: Insufficient documentation

## 2021-08-01 DIAGNOSIS — G894 Chronic pain syndrome: Secondary | ICD-10-CM

## 2021-08-01 DIAGNOSIS — G8929 Other chronic pain: Secondary | ICD-10-CM | POA: Diagnosis present

## 2021-08-01 DIAGNOSIS — M25561 Pain in right knee: Secondary | ICD-10-CM | POA: Insufficient documentation

## 2021-08-01 DIAGNOSIS — Z79891 Long term (current) use of opiate analgesic: Secondary | ICD-10-CM | POA: Insufficient documentation

## 2021-08-01 MED ORDER — METHADONE HCL 5 MG PO TABS: 5.0000 mg | ORAL_TABLET | Freq: Three times a day (TID) | ORAL | 0 refills | Status: DC

## 2021-08-01 MED ORDER — METHADONE HCL 5 MG PO TABS: 5.0000 mg | ORAL_TABLET | Freq: Three times a day (TID) | ORAL | 0 refills | Status: AC

## 2021-08-01 NOTE — Progress Notes (Signed)
Safety precautions to be maintained throughout the outpatient stay will include: orient to surroundings, keep bed in low position, maintain call bell within reach at all times, provide assistance with transfer out of bed and ambulation.   Nursing Pain Medication Assessment:  Safety precautions to be maintained throughout the outpatient stay will include: orient to surroundings, keep bed in low position, maintain call bell within reach at all times, provide assistance with transfer out of bed and ambulation.  Medication Inspection Compliance: Pill count conducted under aseptic conditions, in front of the patient. Neither the pills nor the bottle was removed from the patient's sight at any time. Once count was completed pills were immediately returned to the patient in their original bottle.  Medication: Methadone Pill/Patch Count:  73 of 90 pills remain Pill/Patch Appearance: Markings consistent with prescribed medication Bottle Appearance: Standard pharmacy container. Clearly labeled. Filled Date: 02 / 06 / 2023 Last Medication intake:  Today

## 2021-08-01 NOTE — Progress Notes (Signed)
PROVIDER NOTE: Information contained herein reflects review and annotations entered in association with encounter. Interpretation of such information and data should be left to medically-trained personnel. Information provided to patient can be located elsewhere in the medical record under "Patient Instructions". Document created using STT-dictation technology, any transcriptional errors that may result from process are unintentional.    Patient: Natasha Gonzales  Service Category: E/M  Provider: Gillis Santa, MD  DOB: May 30, 1956  DOS: 08/01/2021  Specialty: Interventional Pain Management  MRN: [id number]  Setting: Ambulatory outpatient  PCP: Cletis Athens, MD  Type: Established Patient    Referring Provider: Cletis Athens, MD  Location: Office  Delivery: Face-to-face     HPI  [name], a 66 y.o. year old female, is here today because of her Chronic pain syndrome [G89.4]. Ms. Paff primary complain today is Pain (Groin into back pain )  Last encounter: My last encounter with her was on 02/19/21 Pertinent problems: Ms. Kromer has Anxiety; Essential hypertension; Rheumatoid arthritis involving multiple sites with positive rheumatoid factor ([place]); Chronic pain syndrome; Lumbar degenerative disc disease; Sacroiliac joint pain; Encounter for long-term methadone use for pain control; and Pain management contract signed on their pertinent problem list. Pain Assessment: Severity of Chronic pain is reported as a 2 /10. Location: Groin Right/Radiates from groin into across the right lower back then pain shoots down to rigth foot. Onset: More than a month ago. Quality: Constant, Shooting, Sharp. Timing: Constant. Modifying factor(s): Pain medication. Vitals:  height is _0  (1.575 m) and weight is 188 lb (85.3 kg). Her oral temperature is 98 F (36.7 C). Her blood pressure is 114/69 and her pulse is 80. Her respiration is 165 (abnormal) and oxygen saturation is 99%.   Reason for encounter: medication  management.   Patient continues multimodal pain regimen as prescribed.  States that it provides pain relief and improvement in functional status.   Pharmacotherapy Assessment  Analgesic: Methadone 5 mg TID prn #90/month, MME= 45   Monitoring: [place] PMP: PDMP reviewed during this encounter.       Pharmacotherapy: No side-effects or adverse reactions reported. Compliance: No problems identified. Effectiveness: Clinically acceptable.  UDS:  Summary  Date Value Ref Range Status  11/13/2020 Note  Final    Comment:    ==================================================================== [employer] Select 13 (MW) ==================================================================== Test                             Result       Flag       Units  Drug Present and Declared for Prescription Verification   Methadone                      1569         EXPECTED   ng/mg creat   EDDP (Methadone Mtb)           2820         EXPECTED   ng/mg creat    Sources of methadone include scheduled prescription medications.    EDDP is an expected metabolite of methadone.  ==================================================================== Test                      Result    Flag   Units      Ref Range   Creatinine              159  mg/dL      >=20 ==================================================================== Declared Medications:  The flagging and interpretation on this report are based on the  following declared medications.  Unexpected results may arise from  inaccuracies in the declared medications.   **Note: The testing scope of this panel includes these medications:   Methadone   **Note: The testing scope of this panel does not include the  following reported medications:   Aspirin  Fluticasone (Flonase)  Hydroxychloroquine (Plaquenil)  Ibuprofen (Advil)  Lisinopril  Multivitamin  Pseudoephedrine (Sudafed)  Rosuvastatin   Turmeric ==================================================================== For clinical consultation, please call [phone]. ====================================================================       ROS  Constitutional: Denies any fever or chills Gastrointestinal: No reported hemesis, hematochezia, vomiting, or acute GI distress Musculoskeletal:  right knee pain Neurological: No reported episodes of acute onset apraxia, aphasia, dysarthria, agnosia, amnesia, paralysis, loss of coordination, or loss of consciousness  Medication Review  Turmeric, aspirin EC, fluticasone, hydroxychloroquine, ibuprofen, leflunomide, lisinopril, meloxicam, methadone, multivitamin, pseudoephedrine, and rosuvastatin  History Review  Allergy: Ms. Devos is allergic to erythromycin base. Drug: Ms. Myer  reports no history of drug use. Alcohol:  reports that she does not currently use alcohol. Tobacco:  reports that she has been smoking cigarettes. She has never used smokeless tobacco. Social: Ms. Bouska  reports that she has been smoking cigarettes. She has never used smokeless tobacco. She reports that she does not currently use alcohol. She reports that she does not use drugs. Medical:  has a past medical history of Anxiety, Depression, Generalized headaches, Hyperlipidemia, Hypertension, Ovarian cyst, and Rheumatoid arthritis ([place]). Surgical: Ms. Surles  has a past surgical history that includes Tonsilectomy/adenoidectomy with myringotomy (1984); Abdominal hysterectomy (1993); adhesion (2001); Ovarian cyst removal; and Joint replacement. Family: family history includes Cancer in her brother and another family member; Heart disease in her father and mother; Hypertension in her mother; Kidney disease in her mother.  Laboratory Chemistry Profile   Renal Lab Results  Component Value Date   BUN 13 01/24/2014   CREATININE 1.05 01/24/2014   GFRAA >60 01/24/2014   GFRNONAA 59 (L) 01/24/2014      Hepatic Lab Results  Component Value Date   AST 22 01/24/2014   ALT 30 01/24/2014   ALBUMIN 4.2 01/24/2014   ALKPHOS 74 01/24/2014   LIPASE 129 01/24/2014     Electrolytes Lab Results  Component Value Date   NA 138 01/24/2014   K 4.0 01/24/2014   CL 104 01/24/2014   CALCIUM 8.9 01/24/2014     Bone No results found for: VD25OH, VD125OH2TOT, GG8366QH4, TM5465KP5, 25OHVITD1, 25OHVITD2, 25OHVITD3, TESTOFREE, TESTOSTERONE   Inflammation (CRP: Acute Phase) (ESR: Chronic Phase) No results found for: CRP, ESRSEDRATE, LATICACIDVEN     Note: Above Lab results reviewed.  Recent Imaging Review  MM 3D SCREEN BREAST BILATERAL CLINICAL DATA:  Screening.  EXAM: DIGITAL SCREENING BILATERAL MAMMOGRAM WITH TOMO AND CAD  COMPARISON:  Previous exam(s).  ACR Breast Density Category b: There are scattered areas of fibroglandular density.  FINDINGS: There are no findings suspicious for malignancy. Images were processed with CAD.  IMPRESSION: No mammographic evidence of malignancy. A result letter of this screening mammogram will be mailed directly to the patient.  RECOMMENDATION: Screening mammogram in one year. (Code:SM-B-01Y)  BI-RADS CATEGORY  1: Negative.  Electronically Signed   By: Margarette Canada M.D.   On: 04/05/2019 14:47  Note: Reviewed        Physical Exam  General appearance: Well nourished, well developed, and well hydrated. In no  apparent acute distress Mental status: Alert, oriented x 3 (person, place, & time)       Respiratory: No evidence of acute respiratory distress Eyes: PERLA Vitals: BP 114/69    Pulse 80    Temp 98 F (36.7 C) (Oral)    Resp (!) 165    Ht _0  (1.575 m)    Wt 188 lb (85.3 kg)    SpO2 99%    BMI 34.39 kg/m  BMI: Estimated body mass index is 34.39 kg/m as calculated from the following:   Height as of this encounter: _1  (1.575 m).   Weight as of this encounter: 188 lb (85.3 kg). Ideal: Ideal body weight: 50.1 kg (110 lb 7.2  oz) Adjusted ideal body weight: 64.2 kg (141 lb 7.5 oz)  Skin & Axial Inspection: No masses, redness, or swelling Alignment: Symmetrical Functional ROM: Unrestricted ROM       Stability: No instability detected Muscle Tone/Strength: Functionally intact. No obvious neuro-muscular anomalies detected. Sensory (Neurological): Musculoskeletal pain pattern  Gait & Posture Assessment  Ambulation: Unassisted Gait: Relatively normal for age and body habitus Posture: WNL    Lower Extremity Exam      Side: Right lower extremity   Side: Left lower extremity  Stability: No instability observed           Stability: No instability observed          Skin & Extremity Inspection: Skin color, temperature, and hair growth are WNL. No peripheral edema or cyanosis. No masses, redness, swelling, asymmetry, or associated skin lesions. No contractures.   Skin & Extremity Inspection: Skin color, temperature, and hair growth are WNL. No peripheral edema or cyanosis. No masses, redness, swelling, asymmetry, or associated skin lesions. No contractures.  Functional ROM: pain restricted           Functional ROM: Unrestricted ROM                  Muscle Tone/Strength: Functionally intact. No obvious neuro-muscular anomalies detected.   Muscle Tone/Strength: Functionally intact. No obvious neuro-muscular anomalies detected.  Sensory (Neurological): Musculoskeletal pain pattern, severe   Sensory (Neurological): Musculoskeletal pain pattern        DTR: Patellar: deferred today Achilles: deferred today Plantar: deferred today   DTR: Patellar: deferred today Achilles: deferred today Plantar: deferred today  Palpation: No palpable anomalies   Palpation: No palpable anomalies    Assessment   Status Diagnosis  Controlled Having a Flare-up Having a Flare-up 1. Chronic pain syndrome   2. Chronic pain of right knee   3. Rheumatoid arthritis involving multiple sites, unspecified whether rheumatoid factor present ([place])    4. Primary osteoarthritis of right knee   5. Lumbar degenerative disc disease   6. Encounter for long-term methadone use for pain control   7. Pain management contract signed        Plan of Care   Ms. JAIRA CANADY has a current medication list which includes the following long-term medication(s): fluticasone, lisinopril, and rosuvastatin.  Refill Methadone below, UDS up to date and appropriate  Pharmacotherapy (Medications Ordered): Meds ordered this encounter  Medications   methadone (DOLOPHINE) 5 MG tablet    Sig: Take 1 tablet (5 mg total) by mouth every 8 (eight) hours. Must last 30 days.    Dispense:  90 tablet    Refill:  0    [place] STOP ACT - Not applicable. Fill one day early if pharmacy is closed on scheduled refill date.  methadone (DOLOPHINE) 5 MG tablet    Sig: Take 1 tablet (5 mg total) by mouth every 8 (eight) hours. Must last 30 days.    Dispense:  90 tablet    Refill:  0    [place] STOP ACT - Not applicable. Fill one day early if pharmacy is closed on scheduled refill date.   methadone (DOLOPHINE) 5 MG tablet    Sig: Take 1 tablet (5 mg total) by mouth every 8 (eight) hours. Must last 30 days.    Dispense:  90 tablet    Refill:  0    [place] STOP ACT - Not applicable. Fill one day early if pharmacy is closed on scheduled refill date.    Follow-up plan:   Return in about 3 months (around 10/29/2021) for Medication Management, in person.    Recent Visits Date Type Provider Dept  05/09/21 Office Visit Gillis Santa, MD [facility]  Showing recent visits within past 90 days and meeting all other requirements Today's Visits Date Type Provider Dept  08/01/21 Office Visit Gillis Santa, MD [facility]  Showing today's visits and meeting all other requirements Future Appointments Date Type Provider Dept  10/29/21 Appointment Gillis Santa, MD [facility]  Showing future appointments within next 90 days and meeting all other requirements  I  discussed the assessment and treatment plan with the patient. The patient was provided an opportunity to ask questions and all were answered. The patient agreed with the plan and demonstrated an understanding of the instructions.  Patient advised to call back or seek an in-person evaluation if the symptoms or condition worsens.  Duration of encounter: 30 minutes.  Note by: Gillis Santa, MD Date: 08/01/2021; Time: 12:07 PM

## 2021-10-23 ENCOUNTER — Other Ambulatory Visit: Payer: Self-pay | Admitting: Adult Health

## 2021-10-23 DIAGNOSIS — Z1231 Encounter for screening mammogram for malignant neoplasm of breast: Secondary | ICD-10-CM

## 2021-10-29 ENCOUNTER — Ambulatory Visit
Payer: Managed Care, Other (non HMO) | Attending: Student in an Organized Health Care Education/Training Program | Admitting: Student in an Organized Health Care Education/Training Program

## 2021-10-29 ENCOUNTER — Encounter: Payer: Self-pay | Admitting: Student in an Organized Health Care Education/Training Program

## 2021-10-29 VITALS — BP 129/81 | HR 83 | Temp 97.6°F | Ht 62.0 in | Wt 185.0 lb

## 2021-10-29 DIAGNOSIS — M25561 Pain in right knee: Secondary | ICD-10-CM | POA: Insufficient documentation

## 2021-10-29 DIAGNOSIS — G8929 Other chronic pain: Secondary | ICD-10-CM | POA: Insufficient documentation

## 2021-10-29 DIAGNOSIS — M5136 Other intervertebral disc degeneration, lumbar region: Secondary | ICD-10-CM | POA: Insufficient documentation

## 2021-10-29 DIAGNOSIS — M069 Rheumatoid arthritis, unspecified: Secondary | ICD-10-CM | POA: Insufficient documentation

## 2021-10-29 DIAGNOSIS — M1711 Unilateral primary osteoarthritis, right knee: Secondary | ICD-10-CM | POA: Insufficient documentation

## 2021-10-29 DIAGNOSIS — G894 Chronic pain syndrome: Secondary | ICD-10-CM | POA: Insufficient documentation

## 2021-10-29 MED ORDER — METHADONE HCL 5 MG PO TABS: 5.0000 mg | ORAL_TABLET | Freq: Three times a day (TID) | ORAL | 0 refills | Status: DC

## 2021-10-29 MED ORDER — METHADONE HCL 5 MG PO TABS: 5.0000 mg | ORAL_TABLET | Freq: Three times a day (TID) | ORAL | 0 refills | Status: AC

## 2021-10-29 NOTE — Progress Notes (Signed)
PROVIDER NOTE: Information contained herein reflects review and annotations entered in association with encounter. Interpretation of such information and data should be left to medically-trained personnel. Information provided to patient can be located elsewhere in the medical record under "Patient Instructions". Document created using STT-dictation technology, any transcriptional errors that may result from process are unintentional.  ?  ?Patient: Natasha Gonzales  Service Category: E/M  Provider: Gillis Santa, MD  ?DOB: [birth year]  DOS: 10/29/2021  Specialty: Interventional Pain Management  ?MRN: [id number]  Setting: Ambulatory outpatient  PCP: Cletis Athens, MD  ?Type: Established Patient    Referring Provider: Gae Bon, NP  ?Location: Office  Delivery: Face-to-face    ? ?HPI  ?[name], a 66 y.o. year old female, is here today because of her Chronic pain syndrome [G89.4]. Natasha Gonzales primary complain today is Abdominal Pain (Right lower) ? ?Last encounter: My last encounter with her was on 02/19/21 ?Pertinent problems: Natasha Gonzales has Anxiety; Essential hypertension; Rheumatoid arthritis involving multiple sites with positive rheumatoid factor ([place]); Chronic pain syndrome; Lumbar degenerative disc disease; Sacroiliac joint pain; Encounter for long-term methadone use for pain control; and Pain management contract signed on their pertinent problem list. ?Pain Assessment: Severity of Chronic pain is reported as a 2 /10. Location: Abdomen Right/from right lower abdomen "through and around to right lower back, then down inside of right leg to bottom of right foot". Onset: More than a month ago. Quality: Aching, Sharp. Timing: Constant. Modifying factor(s): meds. ?Vitals:  height is _0  (1.575 m) and weight is 185 lb (83.9 kg). Her temporal temperature is 97.6 ?F (36.4 ?C). Her blood pressure is 129/81 and her pulse is 83. Her oxygen saturation is 97%.  ? ?Reason for encounter: medication management.    ? ?Patient continues multimodal pain regimen as prescribed.  States that it provides pain relief and improvement in functional status. ?Has been working with physical therapy for her right knee pain related to right knee osteoarthritis.  She would like to proceed with a right knee steroid injection which she has had in the past and it was beneficial for her right knee pain. ?We will also renew our annual urine toxicology screen today for medication compliance and monitoring ? ? ?Pharmacotherapy Assessment  ?Analgesic: Methadone 5 mg TID prn #90/month, MME= 45  ? ?Monitoring: ?[place] PMP: PDMP reviewed during this encounter.       ?Pharmacotherapy: No side-effects or adverse reactions reported. ?Compliance: No problems identified. ?Effectiveness: Clinically acceptable.  UDS:  ?Summary  ?Date Value Ref Range Status  ?11/13/2020 Note  Final  ?  Comment:  ?  ==================================================================== ?[employer] Select 13 (MW) ?==================================================================== ?Test                             Result       Flag       Units ? ?Drug Present and Declared for Prescription Verification ?  Methadone                      1569         EXPECTED   ng/mg creat ?  EDDP (Methadone Mtb)           2820         EXPECTED   ng/mg creat ?   Sources of methadone include scheduled prescription medications. ?   EDDP is an expected metabolite of methadone. ? ?==================================================================== ?Test  Result    Flag   Units      Ref Range ?  Creatinine              159              mg/dL      >=20 ?==================================================================== ?Declared Medications: ? The flagging and interpretation on this report are based on the ? following declared medications.  Unexpected results may arise from ? inaccuracies in the declared medications. ? ? **Note: The testing scope of this panel includes these  medications: ? ? Methadone ? ? **Note: The testing scope of this panel does not include the ? following reported medications: ? ? Aspirin ? Fluticasone (Flonase) ? Hydroxychloroquine (Plaquenil) ? Ibuprofen (Advil) ? Lisinopril ? Multivitamin ? Pseudoephedrine (Sudafed) ? Rosuvastatin ? Turmeric ?==================================================================== ?For clinical consultation, please call [phone]. ?==================================================================== ?  ?  ? ? ?ROS  ?Constitutional: Denies any fever or chills ?Gastrointestinal: No reported hemesis, hematochezia, vomiting, or acute GI distress ?Musculoskeletal:  right knee pain ?Neurological: No reported episodes of acute onset apraxia, aphasia, dysarthria, agnosia, amnesia, paralysis, loss of coordination, or loss of consciousness ? ?Medication Review  ?Turmeric, aspirin EC, busPIRone, desloratadine, fluticasone, hydroxychloroquine, ibuprofen, leflunomide, lisinopril, meloxicam, methadone, montelukast, multivitamin, pseudoephedrine, and rosuvastatin ? ?History Review  ?Allergy: Natasha Gonzales is allergic to erythromycin base. ?Drug: Natasha Gonzales  reports no history of drug use. ?Alcohol:  reports that she does not currently use alcohol. ?Tobacco:  reports that she has been smoking cigarettes. She has never used smokeless tobacco. ?Social: Natasha Gonzales  reports that she has been smoking cigarettes. She has never used smokeless tobacco. She reports that she does not currently use alcohol. She reports that she does not use drugs. ?Medical:  has a past medical history of Anxiety, Depression, Generalized headaches, Hyperlipidemia, Hypertension, Ovarian cyst, and Rheumatoid arthritis ([place]). ?Surgical: Natasha Gonzales  has a past surgical history that includes Tonsilectomy/adenoidectomy with myringotomy (1984); Abdominal hysterectomy (1993); adhesion (2001); Ovarian cyst removal; and Joint replacement. ?Family: family history includes Cancer in her  brother and another family member; Heart disease in her father and mother; Hypertension in her mother; Kidney disease in her mother. ? ?Laboratory Chemistry Profile  ? ?Renal ?Lab Results  ?Component Value Date  ? BUN 13 01/24/2014  ? CREATININE 1.05 01/24/2014  ? GFRAA >60 01/24/2014  ? GFRNONAA 59 (L) 01/24/2014  ? ?  Hepatic ?Lab Results  ?Component Value Date  ? AST 22 01/24/2014  ? ALT 30 01/24/2014  ? ALBUMIN 4.2 01/24/2014  ? ALKPHOS 74 01/24/2014  ? LIPASE 129 01/24/2014  ? ?  ?Electrolytes ?Lab Results  ?Component Value Date  ? NA 138 01/24/2014  ? K 4.0 01/24/2014  ? CL 104 01/24/2014  ? CALCIUM 8.9 01/24/2014  ? ?  Bone ?No results found for: [place], [id number], [id number], [id number], 25OHVITD1, 25OHVITD2, 25OHVITD3, TESTOFREE, TESTOSTERONE ?  ?Inflammation (CRP: Acute Phase) (ESR: Chronic Phase) ?No results found for: CRP, ESRSEDRATE, LATICACIDVEN ?    ?Note: Above Lab results reviewed. ? ?Recent Imaging Review  ?MM 3D SCREEN BREAST BILATERAL ?CLINICAL DATA:  Screening. ? ?EXAM: ?DIGITAL SCREENING BILATERAL MAMMOGRAM WITH TOMO AND CAD ? ?COMPARISON:  Previous exam(s). ? ?ACR Breast Density Category b: There are scattered areas of ?fibroglandular density. ? ?FINDINGS: ?There are no findings suspicious for malignancy. Images were ?processed with CAD. ? ?IMPRESSION: ?No mammographic evidence of malignancy. A result letter of this ?screening mammogram will be mailed directly to the patient. ? ?RECOMMENDATION: ?Screening mammogram in  one year. (Code:SM-B-01Y) ? ?BI-RADS CATEGORY  1: Negative. ? ?Electronically Signed ?  By: Margarette Canada M.D. ?  On: 04/05/2019 14:47 ? ?Note: Reviewed       ? ?Physical Exam  ?General appearance: Well nourished, well developed, and well hydrated. In no apparent acute distress ?Mental status: Alert, oriented x 3 (person, place, & time)       ?Respiratory: No evidence of acute respiratory distress ?Eyes: PERLA ?Vitals: BP 129/81   Pulse 83   Temp 97.6 ?F (36.4 ?C) (Temporal)    Ht _0  (1.575 m)   Wt 185 lb (83.9 kg)   SpO2 97%   BMI 33.84 kg/m?  ?BMI: Estimated body mass index is 33.84 kg/m? as calculated from the following: ?  Height as of this encounter: _1  (1.575 m). ?  Weight as o

## 2021-10-29 NOTE — Progress Notes (Signed)
Nursing Pain Medication Assessment:  ?Safety precautions to be maintained throughout the outpatient stay will include: orient to surroundings, keep bed in low position, maintain call bell within reach at all times, provide assistance with transfer out of bed and ambulation.  ?Medication Inspection Compliance: Pill count conducted under aseptic conditions, in front of the patient. Neither the pills nor the bottle was removed from the patient's sight at any time. Once count was completed pills were immediately returned to the patient in their original bottle. ? ?Medication: Methadone ?Pill/Patch Count:  84 of 90 pills remain ?Pill/Patch Appearance: Markings consistent with prescribed medication ?Bottle Appearance: Standard pharmacy container. Clearly labeled. ?Filled Date: 05 / 05 / 2023 ?Last Medication intake:  Today ?

## 2021-11-11 ENCOUNTER — Ambulatory Visit: Payer: Managed Care, Other (non HMO) | Admitting: Student in an Organized Health Care Education/Training Program

## 2021-11-13 ENCOUNTER — Ambulatory Visit: Payer: Managed Care, Other (non HMO) | Admitting: Student in an Organized Health Care Education/Training Program

## 2021-11-20 ENCOUNTER — Ambulatory Visit: Payer: Managed Care, Other (non HMO) | Admitting: Student in an Organized Health Care Education/Training Program

## 2022-01-21 ENCOUNTER — Ambulatory Visit
Payer: Managed Care, Other (non HMO) | Attending: Student in an Organized Health Care Education/Training Program | Admitting: Student in an Organized Health Care Education/Training Program

## 2022-01-21 ENCOUNTER — Encounter: Payer: Self-pay | Admitting: Student in an Organized Health Care Education/Training Program

## 2022-01-21 DIAGNOSIS — G894 Chronic pain syndrome: Secondary | ICD-10-CM | POA: Insufficient documentation

## 2022-01-21 MED ORDER — METHADONE HCL 5 MG PO TABS: 5.0000 mg | ORAL_TABLET | Freq: Three times a day (TID) | ORAL | 0 refills | Status: AC

## 2022-01-21 MED ORDER — METHADONE HCL 5 MG PO TABS
5.0000 mg | ORAL_TABLET | Freq: Three times a day (TID) | ORAL | 0 refills | Status: DC
Start: 2022-03-25 — End: 2022-04-15

## 2022-01-21 NOTE — Progress Notes (Signed)
Nursing Pain Medication Assessment:  Safety precautions to be maintained throughout the outpatient stay will include: orient to surroundings, keep bed in low position, maintain call bell within reach at all times, provide assistance with transfer out of bed and ambulation.  Medication Inspection Compliance: Pill count conducted under aseptic conditions, in front of the patient. Neither the pills nor the bottle was removed from the patient's sight at any time. Once count was completed pills were immediately returned to the patient in their original bottle.  Medication: Methadone Pill/Patch Count:  18 of 90 pills remain Pill/Patch Appearance: Markings consistent with prescribed medication Bottle Appearance: Standard pharmacy container. Clearly labeled. Filled Date: 07 / 05 / 2023 Last Medication intake:  Today

## 2022-01-21 NOTE — Progress Notes (Signed)
PROVIDER NOTE: Information contained herein reflects review and annotations entered in association with encounter. Interpretation of such information and data should be left to medically-trained personnel. Information provided to patient can be located elsewhere in the medical record under "Patient Instructions". Document created using STT-dictation technology, any transcriptional errors that may result from process are unintentional.    Patient: Natasha Gonzales  Service Category: E/M  Provider: Gillis Santa, MD  DOB: 08/03/1955  DOS: 01/21/2022  Specialty: Interventional Pain Management  MRN: [id number]  Setting: Ambulatory outpatient  PCP: Natasha Athens, MD  Type: Established Patient    Referring Provider: Cletis Athens, MD  Location: Office  Delivery: Face-to-face     HPI  Natasha Gonzales, a 66 y.o. year old female, is here today because of her No primary diagnosis found.. [name] primary complain today is Back Pain  Last encounter: My last encounter with her was on 10/29/21  Pertinent problems: [name] has Anxiety; Essential hypertension; Rheumatoid arthritis involving multiple sites with positive rheumatoid factor ([place]); Chronic pain syndrome; Lumbar degenerative disc disease; Sacroiliac joint pain; Encounter for long-term methadone use for pain control; and Pain management contract signed on their pertinent problem list. Pain Assessment: Severity of Chronic pain is reported as a 3 /10. Location: Back Lower, Right/around to front of leg and down to right foot, bottom of foot. Onset: More than a month ago. Quality: Sharp, Other (Comment) ("hot pain"). Timing: Constant. Modifying factor(s): meds. Vitals:  height is _0  (1.575 m) and weight is 180 lb (81.6 kg). Her temporal temperature is 97.3 F (36.3 C) (abnormal). Her blood pressure is 163/76 (abnormal) and her pulse is 82. Her respiration is 16 and oxygen saturation is 97%.   Reason for encounter: medication management.    Patient  continues multimodal pain regimen as prescribed.  States that it provides pain relief and improvement in functional status. We will need to update EKG at next visit.   Pharmacotherapy Assessment  Analgesic: Methadone 5 mg TID prn #90/month, MME= 45   Monitoring: [place] PMP: PDMP reviewed during this encounter.       Pharmacotherapy: No side-effects or adverse reactions reported. Compliance: No problems identified. Effectiveness: Clinically acceptable.  UDS:  Summary  Date Value Ref Range Status  11/13/2020 Note  Final    Comment:    ==================================================================== [employer] (MW) ==================================================================== Test                             Result       Flag       Units  Drug Present and Declared for Prescription Verification   Methadone                      1569         EXPECTED   ng/mg creat   EDDP (Methadone Mtb)           2820         EXPECTED   ng/mg creat    Sources of methadone include scheduled prescription medications.    EDDP is an expected metabolite of methadone.  ==================================================================== Test                      Result    Flag   Units      Ref Range   Creatinine              159  mg/dL      >=20 ==================================================================== Declared Medications:  The flagging and interpretation on this report are based on the  following declared medications.  Unexpected results may arise from  inaccuracies in the declared medications.   **Note: The testing scope of this panel includes these medications:   Methadone   **Note: The testing scope of this panel does not include the  following reported medications:   Aspirin  Fluticasone (Flonase)  Hydroxychloroquine (Plaquenil)  Ibuprofen (Advil)  Lisinopril  Multivitamin  Pseudoephedrine (Sudafed)  Rosuvastatin   Turmeric ==================================================================== For clinical consultation, please call [phone]. ====================================================================       ROS  Constitutional: Denies any fever or chills Gastrointestinal: No reported hemesis, hematochezia, vomiting, or acute GI distress Musculoskeletal:  low back pain Neurological: No reported episodes of acute onset apraxia, aphasia, dysarthria, agnosia, amnesia, paralysis, loss of coordination, or loss of consciousness  Medication Review  Turmeric, aspirin EC, busPIRone, desloratadine, hydroxychloroquine, ibuprofen, leflunomide, lisinopril, meloxicam, methadone, montelukast, multivitamin, and rosuvastatin  History Review  Allergy: [name] is allergic to erythromycin base. Drug: [name]  reports no history of drug use. Alcohol:  reports that she does not currently use alcohol. Tobacco:  reports that she has been smoking cigarettes. She has never used smokeless tobacco. Social: [name]  reports that she has been smoking cigarettes. She has never used smokeless tobacco. She reports that she does not currently use alcohol. She reports that she does not use drugs. Medical:  has a past medical history of Anxiety, Depression, Generalized headaches, Hyperlipidemia, Hypertension, Ovarian cyst, and Rheumatoid arthritis ([place]). Surgical: [name]  has a past surgical history that includes Tonsilectomy/adenoidectomy with myringotomy (1984); Abdominal hysterectomy (1993); adhesion (2001); Ovarian cyst removal; and Joint replacement. Family: family history includes Cancer in her brother and another family member; Heart disease in her father and mother; Hypertension in her mother; Kidney disease in her mother.  Laboratory Chemistry Profile   Renal Lab Results  Component Value Date   BUN 13 01/24/2014   CREATININE 1.05 01/24/2014   GFRAA >60 01/24/2014   GFRNONAA 59 (L) 01/24/2014      Hepatic Lab Results  Component Value Date   AST 22 01/24/2014   ALT 30 01/24/2014   ALBUMIN 4.2 01/24/2014   ALKPHOS 74 01/24/2014   LIPASE 129 01/24/2014     Electrolytes Lab Results  Component Value Date   NA 138 01/24/2014   K 4.0 01/24/2014   CL 104 01/24/2014   CALCIUM 8.9 01/24/2014     Bone No results found for: "VD25OH", "VD125OH2TOT", "AF7903YB3", "XO3291BT6", "25OHVITD1", "25OHVITD2", "25OHVITD3", "TESTOFREE", "TESTOSTERONE"   Inflammation (CRP: Acute Phase) (ESR: Chronic Phase) No results found for: "CRP", "ESRSEDRATE", "LATICACIDVEN"     Note: Above Lab results reviewed.  Recent Imaging Review  MM 3D SCREEN BREAST BILATERAL CLINICAL DATA:  Screening.  EXAM: DIGITAL SCREENING BILATERAL MAMMOGRAM WITH TOMO AND CAD  COMPARISON:  Previous exam(s).  ACR Breast Density Category b: There are scattered areas of fibroglandular density.  FINDINGS: There are no findings suspicious for malignancy. Images were processed with CAD.  IMPRESSION: No mammographic evidence of malignancy. A result letter of this screening mammogram will be mailed directly to the patient.  RECOMMENDATION: Screening mammogram in one year. (Code:SM-B-01Y)  BI-RADS CATEGORY  1: Negative.  Electronically Signed   By: Margarette Canada M.D.   On: 04/05/2019 14:47  Note: Reviewed        Physical Exam  General appearance: Well nourished, well developed, and well hydrated. In  no apparent acute distress Mental status: Alert, oriented x 3 (person, place, & time)       Respiratory: No evidence of acute respiratory distress Eyes: PERLA Vitals: BP (!) 163/76   Pulse 82   Temp (!) 97.3 F (36.3 C) (Temporal)   Resp 16   Ht _0  (1.575 m)   Wt 180 lb (81.6 kg)   SpO2 97%   BMI 32.92 kg/m  BMI: Estimated body mass index is 32.92 kg/m as calculated from the following:   Height as of this encounter: _1  (1.575 m).   Weight as of this encounter: 180 lb (81.6 kg). Ideal: Ideal body  weight: 50.1 kg (110 lb 7.2 oz) Adjusted ideal body weight: 62.7 kg (138 lb 4.3 oz)  Lumbar spine: Skin & Axial Inspection: No masses, redness, or swelling Alignment: Symmetrical Functional ROM: Unrestricted ROM       Stability: No instability detected Muscle Tone/Strength: Functionally intact. No obvious neuro-muscular anomalies detected. Sensory (Neurological): Musculoskeletal pain pattern  Gait & Posture Assessment  Ambulation: Unassisted Gait: Relatively normal for age and body habitus Posture: WNL    Lower Extremity Exam      Side: Right lower extremity   Side: Left lower extremity  Stability: No instability observed           Stability: No instability observed          Skin & Extremity Inspection: Skin color, temperature, and hair growth are WNL. No peripheral edema or cyanosis. No masses, redness, swelling, asymmetry, or associated skin lesions. No contractures.   Skin & Extremity Inspection: Skin color, temperature, and hair growth are WNL. No peripheral edema or cyanosis. No masses, redness, swelling, asymmetry, or associated skin lesions. No contractures.  Functional ROM: pain restricted           Functional ROM: Unrestricted ROM                  Muscle Tone/Strength: Functionally intact. No obvious neuro-muscular anomalies detected.   Muscle Tone/Strength: Functionally intact. No obvious neuro-muscular anomalies detected.  Sensory (Neurological): Musculoskeletal pain pattern, severe   Sensory (Neurological): Musculoskeletal pain pattern        DTR: Patellar: deferred today Achilles: deferred today Plantar: deferred today   DTR: Patellar: deferred today Achilles: deferred today Plantar: deferred today  Palpation: No palpable anomalies   Palpation: No palpable anomalies    Assessment   Status Diagnosis  Controlled Having a Flare-up Having a Flare-up 1. Chronic pain syndrome         Plan of Care   Ms. DELL HURTUBISE has a current medication list which includes  the following long-term medication(s): desloratadine, lisinopril, montelukast, and rosuvastatin.  1. Chronic pain syndrome - methadone (DOLOPHINE) 5 MG tablet; Take 1 tablet (5 mg total) by mouth every 8 (eight) hours. Must last 30 days.  Dispense: 90 tablet; Refill: 0 - methadone (DOLOPHINE) 5 MG tablet; Take 1 tablet (5 mg total) by mouth every 8 (eight) hours. Must last 30 days.  Dispense: 90 tablet; Refill: 0  Update EKG and urine toxicology screen at next visit.   Pharmacotherapy (Medications Ordered): Meds ordered this encounter  Medications   methadone (DOLOPHINE) 5 MG tablet    Sig: Take 1 tablet (5 mg total) by mouth every 8 (eight) hours. Must last 30 days.    Dispense:  90 tablet    Refill:  0    [place] STOP ACT - Not applicable. Fill one day early if pharmacy is closed  on scheduled refill date.   methadone (DOLOPHINE) 5 MG tablet    Sig: Take 1 tablet (5 mg total) by mouth every 8 (eight) hours. Must last 30 days.    Dispense:  90 tablet    Refill:  0    [place] STOP ACT - Not applicable. Fill one day early if pharmacy is closed on scheduled refill date.   No orders of the defined types were placed in this encounter.    Follow-up plan:   Return in about 3 months (around 04/23/2022) for Medication Management, in clinic [facility].    Recent Visits Date Type Provider Dept  10/29/21 Office Visit Natasha Santa, MD [facility]  Showing recent visits within past 90 days and meeting all other requirements Today's Visits Date Type Provider Dept  01/21/22 Office Visit Natasha Santa, MD [facility]  Showing today's visits and meeting all other requirements Future Appointments Date Type Provider Dept  04/15/22 Appointment Natasha Santa, MD [facility]  Showing future appointments within next 90 days and meeting all other requirements  I discussed the assessment and treatment plan with the patient. The patient was provided an opportunity to ask questions and all  were answered. The patient agreed with the plan and demonstrated an understanding of the instructions.  Patient advised to call back or seek an in-person evaluation if the symptoms or condition worsens.  Duration of encounter: 30 minutes.  Note by: Natasha Santa, MD Date: 01/21/2022; Time: 1:13 PM

## 2022-03-01 ENCOUNTER — Other Ambulatory Visit: Payer: Self-pay | Admitting: *Deleted

## 2022-03-01 DIAGNOSIS — Z1231 Encounter for screening mammogram for malignant neoplasm of breast: Secondary | ICD-10-CM

## 2022-04-15 ENCOUNTER — Encounter: Payer: Self-pay | Admitting: Student in an Organized Health Care Education/Training Program

## 2022-04-15 ENCOUNTER — Ambulatory Visit
Payer: Managed Care, Other (non HMO) | Attending: Student in an Organized Health Care Education/Training Program | Admitting: Student in an Organized Health Care Education/Training Program

## 2022-04-15 VITALS — BP 155/76 | HR 96 | Temp 97.2°F | Ht 62.0 in | Wt 175.0 lb

## 2022-04-15 DIAGNOSIS — M1711 Unilateral primary osteoarthritis, right knee: Secondary | ICD-10-CM | POA: Diagnosis present

## 2022-04-15 DIAGNOSIS — M069 Rheumatoid arthritis, unspecified: Secondary | ICD-10-CM

## 2022-04-15 DIAGNOSIS — M25561 Pain in right knee: Secondary | ICD-10-CM

## 2022-04-15 DIAGNOSIS — Z79891 Long term (current) use of opiate analgesic: Secondary | ICD-10-CM

## 2022-04-15 DIAGNOSIS — G894 Chronic pain syndrome: Secondary | ICD-10-CM

## 2022-04-15 DIAGNOSIS — M533 Sacrococcygeal disorders, not elsewhere classified: Secondary | ICD-10-CM

## 2022-04-15 DIAGNOSIS — G8929 Other chronic pain: Secondary | ICD-10-CM | POA: Diagnosis present

## 2022-04-15 MED ORDER — METHADONE HCL 5 MG PO TABS: 5.0000 mg | ORAL_TABLET | Freq: Three times a day (TID) | ORAL | 0 refills | Status: DC

## 2022-04-15 MED ORDER — METHADONE HCL 5 MG PO TABS: 5.0000 mg | ORAL_TABLET | Freq: Three times a day (TID) | ORAL | 0 refills | Status: AC

## 2022-04-15 NOTE — Progress Notes (Signed)
Nursing Pain Medication Assessment:  Safety precautions to be maintained throughout the outpatient stay will include: orient to surroundings, keep bed in low position, maintain call bell within reach at all times, provide assistance with transfer out of bed and ambulation.  Medication Inspection Compliance: Pill count conducted under aseptic conditions, in front of the patient. Neither the pills nor the bottle was removed from the patient's sight at any time. Once count was completed pills were immediately returned to the patient in their original bottle.  Medication: Methadone Pill/Patch Count:  51 of 90 pills remain Pill/Patch Appearance: Markings consistent with prescribed medication Bottle Appearance: Standard pharmacy container. Clearly labeled. Filled Date: 10 / 9 / 2023 Last Medication intake:  TodaySafety precautions to be maintained throughout the outpatient stay will include: orient to surroundings, keep bed in low position, maintain call bell within reach at all times, provide assistance with transfer out of bed and ambulation.

## 2022-04-15 NOTE — Progress Notes (Signed)
PROVIDER NOTE: Information contained herein reflects review and annotations entered in association with encounter. Interpretation of such information and data should be left to medically-trained personnel. Information provided to patient can be located elsewhere in the medical record under "Patient Instructions". Document created using STT-dictation technology, any transcriptional errors that may result from process are unintentional.    Patient: Natasha Gonzales  Service Category: E/M  Provider: Gillis Santa, MD  DOB: 04-27-1956  DOS: 04/15/2022  Specialty: Interventional Pain Management  MRN: [id number]  Setting: Ambulatory outpatient  PCP: Gae Bon, NP  Type: Established Patient    Referring Provider: Cletis Athens, MD  Location: Office  Delivery: Face-to-face     HPI  [name], a 65 y.o. year old female, is here today because of her Chronic pain syndrome [G89.4]. [name] primary complain today is Back Pain (Lower right back pain.)  Last encounter: My last encounter with her was on 01/21/2022  Pertinent problems: [name] has Anxiety; Essential hypertension; Rheumatoid arthritis involving multiple sites with positive rheumatoid factor ([place]); Chronic pain syndrome; Lumbar degenerative disc disease; Sacroiliac joint pain; Encounter for long-term methadone use for pain control; and Pain management contract signed on their pertinent problem list. Pain Assessment: Severity of Chronic pain is reported as a 3 /10. Location: Back Right/radiates down to foot. Onset: More than a month ago. Quality: Sharp, Constant. Timing: Constant. Modifying factor(s): heat, moving. Vitals:  height is '5\' 2"'  (1.575 m) and weight is 175 lb (79.4 kg). Her temporal temperature is 97.2 F (36.2 C) (abnormal). Her blood pressure is 155/76 (abnormal) and her pulse is 96. Her oxygen saturation is 97%.   Reason for encounter: medication management.    Patient continues multimodal pain regimen as prescribed.   States that it provides pain relief and improvement in functional status. Update EKG for QTc eval given that patient is on methadone. We will also update urine toxicology screen for medication compliance and monitoring.   Pharmacotherapy Assessment  Analgesic: Methadone 5 mg TID prn #90/month, MME= 45   Monitoring: [place] PMP: PDMP reviewed during this encounter.       Pharmacotherapy: No side-effects or adverse reactions reported. Compliance: No problems identified. Effectiveness: Clinically acceptable.  UDS:  Summary  Date Value Ref Range Status  11/13/2020 Note  Final    Comment:    ==================================================================== [employer] Select 13 (MW) ==================================================================== Test                             Result       Flag       Units  Drug Present and Declared for Prescription Verification   Methadone                      1569         EXPECTED   ng/mg creat   EDDP (Methadone Mtb)           2820         EXPECTED   ng/mg creat    Sources of methadone include scheduled prescription medications.    EDDP is an expected metabolite of methadone.  ==================================================================== Test                      Result    Flag   Units      Ref Range   Creatinine  159              mg/dL      >=20 ==================================================================== Declared Medications:  The flagging and interpretation on this report are based on the  following declared medications.  Unexpected results may arise from  inaccuracies in the declared medications.   **Note: The testing scope of this panel includes these medications:   Methadone   **Note: The testing scope of this panel does not include the  following reported medications:   Aspirin  Fluticasone (Flonase)  Hydroxychloroquine (Plaquenil)  Ibuprofen (Advil)  Lisinopril  Multivitamin  Pseudoephedrine  (Sudafed)  Rosuvastatin  Turmeric ==================================================================== For clinical consultation, please call [phone]. ====================================================================       ROS  Constitutional: Denies any fever or chills Gastrointestinal: No reported hemesis, hematochezia, vomiting, or acute GI distress Musculoskeletal:  low back pain Neurological: No reported episodes of acute onset apraxia, aphasia, dysarthria, agnosia, amnesia, paralysis, loss of coordination, or loss of consciousness  Medication Review  Turmeric, aspirin EC, busPIRone, desloratadine, hydroxychloroquine, ibuprofen, leflunomide, lisinopril, meloxicam, methadone, montelukast, multivitamin, and rosuvastatin  History Review  Allergy: [name] is allergic to erythromycin base. Drug: [name]  reports no history of drug use. Alcohol:  reports that she does not currently use alcohol. Tobacco:  reports that she has been smoking cigarettes. She has never used smokeless tobacco. Social: [name]  reports that she has been smoking cigarettes. She has never used smokeless tobacco. She reports that she does not currently use alcohol. She reports that she does not use drugs. Medical:  has a past medical history of Anxiety, Depression, Generalized headaches, Hyperlipidemia, Hypertension, Ovarian cyst, and Rheumatoid arthritis ([place]). Surgical: [name]  has a past surgical history that includes Tonsilectomy/adenoidectomy with myringotomy (1984); Abdominal hysterectomy (1993); adhesion (2001); Ovarian cyst removal; and Joint replacement. Family: family history includes Cancer in her brother and another family member; Heart disease in her father and mother; Hypertension in her mother; Kidney disease in her mother.  Laboratory Chemistry Profile   Renal Lab Results  Component Value Date   BUN 13 01/24/2014   CREATININE 1.05 01/24/2014   GFRAA >60 01/24/2014    GFRNONAA 59 (L) 01/24/2014     Hepatic Lab Results  Component Value Date   AST 22 01/24/2014   ALT 30 01/24/2014   ALBUMIN 4.2 01/24/2014   ALKPHOS 74 01/24/2014   LIPASE 129 01/24/2014     Electrolytes Lab Results  Component Value Date   NA 138 01/24/2014   K 4.0 01/24/2014   CL 104 01/24/2014   CALCIUM 8.9 01/24/2014     Bone No results found for: "VD25OH", "VD125OH2TOT", "HK7425ZD6", "LO7564PP2", "25OHVITD1", "25OHVITD2", "25OHVITD3", "TESTOFREE", "TESTOSTERONE"   Inflammation (CRP: Acute Phase) (ESR: Chronic Phase) No results found for: "CRP", "ESRSEDRATE", "LATICACIDVEN"     Note: Above Lab results reviewed.  Recent Imaging Review  MM 3D SCREEN BREAST BILATERAL CLINICAL DATA:  Screening.  EXAM: DIGITAL SCREENING BILATERAL MAMMOGRAM WITH TOMO AND CAD  COMPARISON:  Previous exam(s).  ACR Breast Density Category b: There are scattered areas of fibroglandular density.  FINDINGS: There are no findings suspicious for malignancy. Images were processed with CAD.  IMPRESSION: No mammographic evidence of malignancy. A result letter of this screening mammogram will be mailed directly to the patient.  RECOMMENDATION: Screening mammogram in one year. (Code:SM-B-01Y)  BI-RADS CATEGORY  1: Negative.  Electronically Signed   By: Margarette Canada M.D.   On: 04/05/2019 14:47  Note: Reviewed  Physical Exam  General appearance: Well nourished, well developed, and well hydrated. In no apparent acute distress Mental status: Alert, oriented x 3 (person, place, & time)       Respiratory: No evidence of acute respiratory distress Eyes: PERLA Vitals: BP (!) 155/76 (BP Location: Right Arm, Patient Position: Sitting, Cuff Size: Normal)   Pulse 96   Temp (!) 97.2 F (36.2 C) (Temporal)   Ht '5\' 2"'  (1.575 m)   Wt 175 lb (79.4 kg)   SpO2 97%   BMI 32.01 kg/m  BMI: Estimated body mass index is 32.01 kg/m as calculated from the following:   Height as of this encounter:  '5\' 2"'  (1.575 m).   Weight as of this encounter: 175 lb (79.4 kg). Ideal: Ideal body weight: 50.1 kg (110 lb 7.2 oz) Adjusted ideal body weight: 61.8 kg (136 lb 4.3 oz)  Lumbar spine: Skin & Axial Inspection: No masses, redness, or swelling Alignment: Symmetrical Functional ROM: Unrestricted ROM       Stability: No instability detected Muscle Tone/Strength: Functionally intact. No obvious neuro-muscular anomalies detected. Sensory (Neurological): Musculoskeletal pain pattern  Gait & Posture Assessment  Ambulation: Unassisted Gait: Relatively normal for age and body habitus Posture: WNL    Lower Extremity Exam      Side: Right lower extremity   Side: Left lower extremity  Stability: No instability observed           Stability: No instability observed          Skin & Extremity Inspection: Skin color, temperature, and hair growth are WNL. No peripheral edema or cyanosis. No masses, redness, swelling, asymmetry, or associated skin lesions. No contractures.   Skin & Extremity Inspection: Skin color, temperature, and hair growth are WNL. No peripheral edema or cyanosis. No masses, redness, swelling, asymmetry, or associated skin lesions. No contractures.  Functional ROM: pain restricted           Functional ROM: Unrestricted ROM                  Muscle Tone/Strength: Functionally intact. No obvious neuro-muscular anomalies detected.   Muscle Tone/Strength: Functionally intact. No obvious neuro-muscular anomalies detected.  Sensory (Neurological): Musculoskeletal pain pattern, severe   Sensory (Neurological): Musculoskeletal pain pattern        DTR: Patellar: deferred today Achilles: deferred today Plantar: deferred today   DTR: Patellar: deferred today Achilles: deferred today Plantar: deferred today  Palpation: No palpable anomalies   Palpation: No palpable anomalies    Assessment   Status Diagnosis  Controlled Controlled Controlled 1. Chronic pain syndrome   2. Encounter for  long-term methadone use for pain control   3. Sacroiliac joint pain   4. Primary osteoarthritis of right knee   5. Rheumatoid arthritis involving multiple sites, unspecified whether rheumatoid factor present ([place])   6. Chronic pain of right knee         Plan of Care   Ms. KAYANA THOEN has a current medication list which includes the following long-term medication(s): desloratadine, lisinopril, montelukast, and rosuvastatin.  1. Chronic pain syndrome - EKG 12-Lead; Standing - methadone (DOLOPHINE) 5 MG tablet; Take 1 tablet (5 mg total) by mouth every 8 (eight) hours. Must last 30 days.  Dispense: 90 tablet; Refill: 0 - methadone (DOLOPHINE) 5 MG tablet; Take 1 tablet (5 mg total) by mouth every 8 (eight) hours. Must last 30 days.  Dispense: 90 tablet; Refill: 0 - methadone (DOLOPHINE) 5 MG tablet; Take 1 tablet (5 mg total)  by mouth every 8 (eight) hours. Must last 30 days.  Dispense: 90 tablet; Refill: 0 - [employer]ox[employer] Select 13 (MW), Urine  2. Encounter for long-term methadone use for pain control - EKG 12-Lead; Standing  3. Sacroiliac joint pain  4. Primary osteoarthritis of right knee  5. Rheumatoid arthritis involving multiple sites, unspecified whether rheumatoid factor present ([place])  6. Chronic pain of right knee  Update EKG and urine toxicology   Pharmacotherapy (Medications Ordered): Meds ordered this encounter  Medications   methadone (DOLOPHINE) 5 MG tablet    Sig: Take 1 tablet (5 mg total) by mouth every 8 (eight) hours. Must last 30 days.    Dispense:  90 tablet    Refill:  0    [place] STOP ACT - Not applicable. Fill one day early if pharmacy is closed on scheduled refill date.   methadone (DOLOPHINE) 5 MG tablet    Sig: Take 1 tablet (5 mg total) by mouth every 8 (eight) hours. Must last 30 days.    Dispense:  90 tablet    Refill:  0    [place] STOP ACT - Not applicable. Fill one day early if pharmacy is closed on scheduled refill date.   methadone (DOLOPHINE) 5 MG  tablet    Sig: Take 1 tablet (5 mg total) by mouth every 8 (eight) hours. Must last 30 days.    Dispense:  90 tablet    Refill:  0    [place] STOP ACT - Not applicable. Fill one day early if pharmacy is closed on scheduled refill date.   Orders Placed This Encounter  Procedures   ToxASSURE Select 13 (MW), Urine    Volume: 30 ml(s). Minimum 3 ml of urine is needed. Document temperature of fresh sample. Indications: Long term (current) use of opiate analgesic (B70.488)    Order Specific Question:   Release to patient    Answer:   Immediate   EKG 12-Lead    If the QTc interval exceeds 500 ms, notify ordering physician immediately.    Standing Status:   Standing    Number of Occurrences:   3    Standing Expiration Date:   04/15/2023    Scheduling Instructions:     Screening ECG Testing for Q-T interval prolongation and risk of torsades de pointes (TdP) on Methadone patient.     Follow-up plan:   Return in about 15 weeks (around 07/29/2022) for Medication Management, in person.    Recent Visits Date Type Provider Dept  01/21/22 Office Visit Gillis Santa, MD [facility]  Showing recent visits within past 90 days and meeting all other requirements Today's Visits Date Type Provider Dept  04/15/22 Office Visit Gillis Santa, MD [facility]  Showing today's visits and meeting all other requirements Future Appointments No visits were found meeting these conditions. Showing future appointments within next 90 days and meeting all other requirements  I discussed the assessment and treatment plan with the patient. The patient was provided an opportunity to ask questions and all were answered. The patient agreed with the plan and demonstrated an understanding of the instructions.  Patient advised to call back or seek an in-person evaluation if the symptoms or condition worsens.  Duration of encounter: 30 minutes.  Note by: Gillis Santa, MD Date: 04/15/2022; Time: 11:37 AM

## 2022-04-18 LAB — TOXASSURE SELECT 13 (MW), URINE

## 2022-05-14 ENCOUNTER — Ambulatory Visit
Admission: RE | Admit: 2022-05-14 | Discharge: 2022-05-14 | Disposition: A | Discharge status: Home / Self Care | Payer: Managed Care, Other (non HMO) | Source: Ambulatory Visit | Attending: Adult Health | Admitting: Adult Health

## 2022-05-14 DIAGNOSIS — Z1231 Encounter for screening mammogram for malignant neoplasm of breast: Secondary | ICD-10-CM | POA: Insufficient documentation

## 2022-05-19 ENCOUNTER — Encounter: Payer: Self-pay | Admitting: Adult Health

## 2022-05-29 ENCOUNTER — Ambulatory Visit
Admission: RE | Admit: 2022-05-29 | Discharge: 2022-05-29 | Disposition: A | Discharge status: Home / Self Care | Payer: Managed Care, Other (non HMO) | Source: Ambulatory Visit | Attending: Student in an Organized Health Care Education/Training Program | Admitting: Student in an Organized Health Care Education/Training Program

## 2022-05-29 DIAGNOSIS — G894 Chronic pain syndrome: Secondary | ICD-10-CM | POA: Insufficient documentation

## 2022-05-29 DIAGNOSIS — R9431 Abnormal electrocardiogram [ECG] [EKG]: Secondary | ICD-10-CM | POA: Insufficient documentation

## 2022-05-29 DIAGNOSIS — Z79891 Long term (current) use of opiate analgesic: Secondary | ICD-10-CM | POA: Insufficient documentation

## 2022-06-10 ENCOUNTER — Other Ambulatory Visit: Payer: Self-pay | Admitting: Adult Health

## 2022-06-10 DIAGNOSIS — R928 Other abnormal and inconclusive findings on diagnostic imaging of breast: Secondary | ICD-10-CM

## 2022-06-10 DIAGNOSIS — R921 Mammographic calcification found on diagnostic imaging of breast: Secondary | ICD-10-CM

## 2022-06-12 ENCOUNTER — Ambulatory Visit
Admission: RE | Admit: 2022-06-12 | Discharge: 2022-06-12 | Disposition: A | Discharge status: Home / Self Care | Payer: Managed Care, Other (non HMO) | Source: Ambulatory Visit | Attending: Adult Health | Admitting: Adult Health

## 2022-06-12 DIAGNOSIS — R921 Mammographic calcification found on diagnostic imaging of breast: Secondary | ICD-10-CM | POA: Insufficient documentation

## 2022-06-12 DIAGNOSIS — R928 Other abnormal and inconclusive findings on diagnostic imaging of breast: Secondary | ICD-10-CM | POA: Insufficient documentation

## 2022-06-18 ENCOUNTER — Other Ambulatory Visit: Payer: Self-pay | Admitting: Adult Health

## 2022-06-18 DIAGNOSIS — R928 Other abnormal and inconclusive findings on diagnostic imaging of breast: Secondary | ICD-10-CM

## 2022-06-18 DIAGNOSIS — R921 Mammographic calcification found on diagnostic imaging of breast: Secondary | ICD-10-CM

## 2022-06-25 ENCOUNTER — Ambulatory Visit
Admission: RE | Admit: 2022-06-25 | Discharge: 2022-06-25 | Disposition: A | Discharge status: Home / Self Care | Payer: Managed Care, Other (non HMO) | Source: Ambulatory Visit | Attending: Adult Health

## 2022-06-25 ENCOUNTER — Ambulatory Visit
Admission: RE | Admit: 2022-06-25 | Discharge: 2022-06-25 | Disposition: A | Discharge status: Home / Self Care | Payer: Managed Care, Other (non HMO) | Source: Ambulatory Visit | Attending: Adult Health | Admitting: Adult Health

## 2022-06-25 DIAGNOSIS — R928 Other abnormal and inconclusive findings on diagnostic imaging of breast: Secondary | ICD-10-CM | POA: Diagnosis not present

## 2022-06-25 DIAGNOSIS — R921 Mammographic calcification found on diagnostic imaging of breast: Secondary | ICD-10-CM | POA: Insufficient documentation

## 2022-06-25 HISTORY — PX: BREAST BIOPSY: SHX20

## 2022-06-25 MED ORDER — LIDOCAINE HCL (PF) 1 % IJ SOLN
5.0000 mL | Freq: Once | INTRAMUSCULAR | Status: AC
  Administered 2022-06-25: 5 mL

## 2022-06-25 MED ORDER — LIDOCAINE HCL (PF) 1 % IJ SOLN
10.0000 mL | Freq: Once | INTRAMUSCULAR | Status: AC
  Administered 2022-06-25: 10 mL

## 2022-06-25 MED ORDER — LIDOCAINE-EPINEPHRINE 1 %-1:100000 IJ SOLN
10.0000 mL | Freq: Once | INTRAMUSCULAR | Status: AC
  Administered 2022-06-25: 10 mL

## 2022-06-26 ENCOUNTER — Encounter: Payer: Self-pay | Admitting: *Deleted

## 2022-06-26 DIAGNOSIS — N6489 Other specified disorders of breast: Secondary | ICD-10-CM

## 2022-06-26 LAB — SURGICAL PATHOLOGY

## 2022-06-26 NOTE — Progress Notes (Signed)
Referral recieved from [facility] for benign breast mass.  Referral sent to [facility] per patient request.

## 2022-07-03 ENCOUNTER — Encounter: Payer: Self-pay | Admitting: Surgery

## 2022-07-03 ENCOUNTER — Ambulatory Visit: Payer: Managed Care, Other (non HMO) | Admitting: Surgery

## 2022-07-03 VITALS — BP 156/81 | HR 90 | Temp 98.1°F | Ht 62.0 in | Wt 178.8 lb

## 2022-07-03 DIAGNOSIS — N6489 Other specified disorders of breast: Secondary | ICD-10-CM

## 2022-07-03 NOTE — Progress Notes (Signed)
Patient ID: Natasha Gonzales, female   DOB: January 28, 1956, 67 y.o.   MRN: [id number]  Chief Complaint: Left breast radial scar, papilloma, complex sclerosing lesion.  History of Present Illness Natasha Gonzales is a 67 y.o. female with biopsy results as reported below, of 2 separate areas of this patient's left breast.  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  The upper inner quadrant biopsy is consistent with radial scar and complex sclerosing lesion.   During much of our discussion we discussed removing both areas, but in retrospect only the upper inner is actually of significant risk. She has been pregnant 5 times with 3 children.  She has no family history of breast cancer.  She underwent a hysterectomy in her mid 35s and had hormonal therapy postop.  She had used birth control preop.  She began menstruating at the age of 73 and had her first pregnancy at the age of 43.  She breast-fed.  She denies any history of palpable mass, nipple discharge, skin changes or breast pain.   Past Medical History Past Medical History:  Diagnosis Date   Anxiety    Depression    Generalized headaches    Hyperlipidemia    Hypertension    Ovarian cyst    Rheumatoid arthritis [facility])       Past Surgical History:  Procedure Laterality Date   ABDOMINAL HYSTERECTOMY  1993   total   adhesion  2001   laprascopic   BREAST BIOPSY Left 06/25/2022   UOQ CALCS, STEREO BX, PATH PENIDNG   BREAST BIOPSY Left 06/25/2022   UIQ CALCS, STEREO BX, PATH PENDING   BREAST BIOPSY Left 06/25/2022   MM LT BREAST BX W LOC DEV 1ST LESION IMAGE BX SPEC STEREO GUIDE 06/25/2022 [facility]   BREAST BIOPSY Left 06/25/2022   MM LT BREAST BX W LOC DEV EA AD LESION IMG BX SPEC STEREO GUIDE 06/25/2022 [facility]   JOINT REPLACEMENT     OVARIAN CYST REMOVAL     TONSILECTOMY/ADENOIDECTOMY WITH MYRINGOTOMY  1984    Allergies  Allergen Reactions   Erythromycin Base     Other reaction(s): Gastric  reaaction    Current Outpatient Medications  Medication Sig Dispense Refill   aspirin EC 81 MG tablet Take 81 mg by mouth daily.     busPIRone (BUSPAR) 7.5 MG tablet Take 7.5 mg by mouth 3 (three) times daily.     hydroxychloroquine (PLAQUENIL) 200 MG tablet Take 200 mg by mouth daily.      ibuprofen (ADVIL,MOTRIN) 800 MG tablet Take 800 mg by mouth every 6 (six) hours as needed.     leflunomide (ARAVA) 10 MG tablet Take 1 tablet by mouth daily.     lisinopril (ZESTRIL) 20 MG tablet Take 1 tablet (20 mg total) by mouth daily. 10 tablet 0   meloxicam (MOBIC) 15 MG tablet meloxicam 15 mg tablet  Take 1 tablet every day by oral route with meals.     methadone (DOLOPHINE) 5 MG tablet Take 1 tablet (5 mg total) by mouth every 8 (eight) hours. Must last 30 days. 90 tablet 0   montelukast (SINGULAIR) 10 MG tablet Take 10 mg by mouth at bedtime.     Multiple Vitamin (MULTIVITAMIN) capsule Take 1 capsule by mouth daily.     rosuvastatin (CRESTOR) 40 MG tablet TAKE 1 TABLET BY MOUTH  DAILY 90 tablet 3   TURMERIC PO Take by mouth daily.     No current facility-administered medications for this  visit.    Family History Family History  Problem Relation Age of Onset   Cancer Brother    Cancer Other    Hypertension Mother    Heart disease Mother    Kidney disease Mother    Heart disease Father    Breast cancer Neg Hx       Social History Social History   Tobacco Use   Smoking status: Every Day    Years: 25.00    Types: Cigarettes   Smokeless tobacco: Never   Tobacco comments:    Stress smoker. 1 cig every now and then  Substance Use Topics   Alcohol use: Not Currently   Drug use: Never        Review of Systems  Constitutional: Negative.   HENT:  Positive for hearing loss.   Eyes: Negative.   Respiratory: Negative.    Cardiovascular: Negative.   Gastrointestinal: Negative.   Genitourinary:  Positive for frequency.  Musculoskeletal:  Positive for joint pain.  Skin:  Negative.   Neurological:  Positive for headaches.  Psychiatric/Behavioral:  The patient is nervous/anxious.      Physical Exam Blood pressure (!) 156/81, pulse 90, temperature 98.1 F (36.7 C), temperature source Oral, height 5\' 2"  (1.575 m), weight 178 lb 12.8 oz (81.1 kg), SpO2 96 %. Last Weight  Most recent update: 07/03/2022  2:03 PM    Weight  81.1 kg (178 lb 12.8 oz)             CONSTITUTIONAL: Well developed, and nourished, appropriately responsive and aware without distress.   EYES: Sclera non-icteric.   EARS, NOSE, MOUTH AND THROAT:  The oropharynx is clear. Oral mucosa is pink and moist.    Hearing is intact to voice.  NECK: Trachea is midline, and there is no jugular venous distension.  LYMPH NODES:  Lymph nodes in the neck are not appreciated. RESPIRATORY:   Normal respiratory effort without pathologic use of accessory muscles. CARDIOVASCULAR:   Well perfused.  GI: The abdomen is soft, nontender, and nondistended.  MUSCULOSKELETAL:  Symmetrical muscle tone appreciated in all four extremities.    SKIN: Skin turgor is normal. No pathologic skin lesions appreciated.  NEUROLOGIC:  Motor and sensation appear grossly normal.  Cranial nerves are grossly without defect. PSYCH:  Alert and oriented to person, place and time. Affect is appropriate for situation.  Data Reviewed I have personally reviewed what is currently available of the patient's imaging, recent labs and medical records.   Labs:     Latest Ref Rng & Units 01/24/2014    1:25 PM  CBC  WBC 3.6 - 11.0 x10 3/mm 3 5.1   Hemoglobin 12.0 - 16.0 g/dL 13.1   Hematocrit 35.0 - 47.0 % 40.0   Platelets 150 - 440 x10 3/mm 3 272       Latest Ref Rng & Units 01/24/2014    1:25 PM  CMP  Glucose 65 - 99 mg/dL 96   BUN 7 - 18 mg/dL 13   Creatinine 0.60 - 1.30 mg/dL 1.05   Sodium 136 - 145 mmol/L 138   Potassium 3.5 - 5.1 mmol/L 4.0   Chloride 98 - 107 mmol/L 104   CO2 21 - 32 mmol/L 26   Calcium 8.5 - 10.1 mg/dL 8.9    Total Protein 6.4 - 8.2 g/dL 7.9   Total Bilirubin 0.2 - 1.0 mg/dL 0.5   Alkaline Phos Unit/L 74   AST 15 - 37 Unit/L 22   ALT U/L 30  SURGICAL PATHOLOGY CASE: [id number] PATIENT: Natasha Gonzales Surgical Pathology Report  Specimen Submitted: A. Breast, UOQ posterior; left B. Breast, UIQ mid depth; left  Clinical History: Calcs. A - Ribbon-shaped clip placed following stereotactic biopsy of LEFT breast, upper outer quadrant. B - Coil-shaped clip placed following stereotactic biopsy of LEFT breast, upper inner quadrant.  DIAGNOSIS: A. BREAST, LEFT UPPER OUTER QUADRANT, POSTERIOR DEPTH; STEREOTACTIC CORE NEEDLE BIOPSY: - BENIGN MAMMARY PARENCHYMA WITH FIBROCYSTIC AND FIBROADENOMATOID CHANGES, WITH ASSOCIATED COARSE DYSTROPHIC CALCIFICATIONS. - FOCAL COLUMNAR CELL CHANGE WITHOUT ATYPIA, AND MINUTE INTRADUCTAL PAPILLOMA. - NEGATIVE FOR ATYPICAL PROLIFERATIVE BREAST DISEASE.  B. BREAST, LEFT UPPER INNER QUADRANT, MIDDLE DEPTH; STEREOTACTIC CORE NEEDLE BIOPSY: - BENIGN MAMMARY PARENCHYMA DISPLAYING CHANGES COMPATIBLE WITH RADIAL SCAR/COMPLEX SCLEROSING LESION, WITH ASSOCIATED FIBROCYSTIC CHANGES, SCLEROSING ADENOSIS, AND COARSE DYSTROPHIC CALCIFICATIONS. - NEGATIVE FOR ATYPICAL PROLIFERATIVE BREAST DISEASE.    Imaging: Radiological images reviewed:  CLINICAL DATA:  Two groups of calcifications in the left breast seen on most recent screening mammography.   EXAM: DIGITAL DIAGNOSTIC UNILATERAL LEFT MAMMOGRAM WITH TOMOSYNTHESIS   TECHNIQUE: Left digital diagnostic mammography and breast tomosynthesis was performed.   COMPARISON:  Previous exam(s).   ACR Breast Density Category b: There are scattered areas of fibroglandular density.   FINDINGS: Additional mammographic views of the left breast demonstrate 2 groups of indeterminate calcifications, in the left breast upper outer quadrant, posterior depth, measuring 6 mm, and left breast slightly lower inner quadrant,  posterior depth, measuring 8 mm in greatest dimension.   IMPRESSION: Two groups of indeterminate calcifications in the left breast, for which stereotactic core needle biopsies are recommended.   RECOMMENDATION: Two site stereotactic core needle biopsy of the left breast.   I have discussed the findings and recommendations with the patient. If applicable, a reminder letter will be sent to the patient regarding the next appointment.   BI-RADS CATEGORY  4: Suspicious.     Electronically Signed   By: Ted Mcalpine M.D.   On: 06/12/2022 11:32 Within last 24 hrs: No results found.  Assessment    2 lesions in the left breast, both associated with similar radiographic findings.  The upper outer quadrant lesion has a minute papilloma that is benign.  It is with vacuum-assisted biopsy technique and its likelihood of upgrading to something worse is very low. The upper inner quadrant lesion has complex sclerosing/radial scar. 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  She is well aware that the majority of patients do not have an upgrade or worsening of their diagnosis, however there  is always a risk of missing/or delaying risk reducing treatment.  At present she is undecided, but feels that being more aggressive surgically may provide her the peace  of mind, especially in lieu of further breast imaging follow-up.  Radial scars -- Complex Sclerosing Lesion (CSL) Radial scars, also called complex sclerosing lesions, are a pathologic diagnosis, usually discovered incidentally when a breast mass or radiologic abnormality is removed or biopsied. Occasionally, radial scars are large enough to be detected on mammography as suspicious spiculated masses, which cannot be reliably differentiated from spiculated carcinoma by imaging alone. Radial scars are characterized microscopically by a fibroelastic core with radiating ducts and lobules. In general, surgical excision is recommended when radial scars or complex sclerosing lesions are diagnosed on core biopsy, based on series showing that 8 to 17 percent of surgical specimens at subsequent excision are positive for malignancy. These high upgrade rates have led to the suggestion that these lesions may actually be premalignant lesions, progressing from scar to hyperplasia to carcinoma. This, however, is controversial, and more recent series suggest the presence of undiagnosed cancer is much lower, with upgrade rates in the range of 0.6 to 3.6 percent. The upgrade rate is even lower with large-volume (eg, vacuum-assisted) needle biopsy.   In a meta-analysis of over 3000 patients with radial scars, the upgrade rate after vacuum-assisted biopsy to invasive and in situ cancer was only 0 and 1 percent, respectively. In a study of 50 patients with radial scar who were followed by active surveillance, no patient progressed on interval imaging at 16 months. The current recommendations from the American Society of Breast Surgeons state that most radial scars "should be excised, although imaging follow-up is reasonable for small, image-detected radial scars  that are completely removed or well-sampled with large-gauge devices and in the setting of imaging-pathology concordance".  No additional treatment beyond excision is needed for radial scars. The risk of subsequent breast cancer after excision in this population is small, and chemoprevention is not indicated.  Face-to-face time spent with the patient and accompanying care providers(if present) was 40 minutes, with more than 50% of the time spent counseling, educating, and coordinating care of the patient.    These notes generated with voice recognition software. I apologize for typographical errors.  Campbell Lerner M.D., FACS 07/04/2022, 1:44 PM

## 2022-07-03 NOTE — Patient Instructions (Addendum)
Our surgery scheduler Pamala Hurry will call you within 24-48 hours to get you scheduled. If you have not heard from her after 48 hours, please call our office. Have the blue sheet available when she calls to write down important information.   If you have any concerns or questions, please feel free to call our office.   Breast Self-Awareness Breast self-awareness means being familiar with how your breasts look and feel. It involves checking your breasts regularly and telling your health care provider about any changes. Practicing breast self-awareness helps to maintain breast health. Sometimes, changes are not harmful (are benign). Other times, a change in your breasts can be a sign of a serious medical problem. Being familiar with the look and feel of your breasts can help you catch a breast problem while it is still small and can be treated. You should do breast self-exams even if you have breast implants. What you need: A mirror. A well-lit room. A pillow or other soft object. How to do a breast self-exam A breast self-exam is one way to learn what is normal for your breasts and whether your breasts are changing. To do a breast self-exam: Look for changes  Remove all the clothing above your waist. Stand in front of a mirror in a room with good lighting. Put your hands down at your sides. Compare your breasts in the mirror. Look for differences between them (asymmetry), such as: Differences in shape. Differences in size. Puckers, dips, and bumps in one breast and not the other. Look at each breast for changes in the skin, such as: Redness. Scaly areas. Skin thickening. Dimpling. Open sores (ulcers). Look for changes in your nipples, such as: Discharge. Bleeding. Dimpling. Redness. A nipple that looks pushed in (retracted), or that has changed position. Feel for changes Carefully feel your breasts for lumps and changes. It is best to do this self-exam while lying down. Follow these  steps to feel each breast: Place a pillow under the shoulder of one side of your body. Place the arm of that side of your body behind your head. Feel the breast of that side of your body using the hand of the opposite arm. To do this: Start in the nipple area and use the pads of your three middle fingers to make -inch (2 cm) overlapping circles. Use light, medium, and then firm pressure as you feel your breast, gently covering the entire breast area and armpit. Continue the overlapping circles, moving downward over the breast until you feel your ribs below your breast. Then, make circles with your fingers going upward until you reach your collarbone. Next, make circles by moving outward across your breast and into your armpit area. Squeeze the nipple. Check for discharge and lumps. Repeat steps 1-7 to check your other breast. Sit or stand in the tub or shower. With soapy water on your skin, feel each breast the same way you did when you were lying down. Write down what you find Writing down what you find can help you remember what to discuss with your health care provider. Write down: What is normal for each breast. Any changes that you find in each breast. These include: The kind of changes you find. Any pain or tenderness. Size and location of any lumps. Where you are in your menstrual cycle, if you are still getting your menstrual period (menstruating). General tips If you are breastfeeding, the best time to examine your breasts is after a feeding or after using a breast  pump. If you menstruate, the best time to examine your breasts is 5-7 days after your menstrual period. Breasts are generally lumpier during menstrual periods, and it may be more difficult to notice changes. With time and practice, you will become more familiar with the differences in your breasts and more comfortable with the exam. Contact a health care provider if: You see a change in the shape or size of your breasts  or nipples. You see a change in the skin of your breast or nipples, such as a reddened or scaly area. You have unusual discharge from your nipples. You find a new lump or thick area. You have breast pain. You have any concerns about your breast health. Summary Breast self-awareness includes looking for physical changes in your breasts and feeling for any changes within your breasts. Breast self-awareness should be done in front of a mirror in a well-lit room. If you menstruate, the best time to examine your breasts is 5-7 days after your menstrual period. Tell your health care provider about any changes you notice in your breasts. Changes include changes in size, changes on the skin, pain or tenderness, or unusual fluid from your nipples. This information is not intended to replace advice given to you by your health care provider. Make sure you discuss any questions you have with your health care provider. Document Revised: 11/14/2021 Document Reviewed: 04/11/2021 Elsevier Patient Education  .

## 2022-07-04 DIAGNOSIS — N6489 Other specified disorders of breast: Secondary | ICD-10-CM | POA: Insufficient documentation

## 2022-07-22 ENCOUNTER — Encounter: Payer: Self-pay | Admitting: Student in an Organized Health Care Education/Training Program

## 2022-07-22 ENCOUNTER — Ambulatory Visit
Payer: Managed Care, Other (non HMO) | Attending: Student in an Organized Health Care Education/Training Program | Admitting: Student in an Organized Health Care Education/Training Program

## 2022-07-22 VITALS — BP 167/68 | HR 86 | Temp 97.7°F | Ht 62.0 in | Wt 175.0 lb

## 2022-07-22 DIAGNOSIS — M1711 Unilateral primary osteoarthritis, right knee: Secondary | ICD-10-CM | POA: Diagnosis present

## 2022-07-22 DIAGNOSIS — M533 Sacrococcygeal disorders, not elsewhere classified: Secondary | ICD-10-CM

## 2022-07-22 DIAGNOSIS — M25561 Pain in right knee: Secondary | ICD-10-CM | POA: Diagnosis present

## 2022-07-22 DIAGNOSIS — G8929 Other chronic pain: Secondary | ICD-10-CM

## 2022-07-22 DIAGNOSIS — Z79891 Long term (current) use of opiate analgesic: Secondary | ICD-10-CM

## 2022-07-22 DIAGNOSIS — M069 Rheumatoid arthritis, unspecified: Secondary | ICD-10-CM | POA: Diagnosis present

## 2022-07-22 DIAGNOSIS — G894 Chronic pain syndrome: Secondary | ICD-10-CM | POA: Diagnosis present

## 2022-07-22 MED ORDER — METHADONE HCL 5 MG PO TABS: 5.0000 mg | ORAL_TABLET | Freq: Three times a day (TID) | ORAL | 0 refills | Status: DC

## 2022-07-22 MED ORDER — METHADONE HCL 5 MG PO TABS: 5.0000 mg | ORAL_TABLET | Freq: Three times a day (TID) | ORAL | 0 refills | Status: AC

## 2022-07-22 NOTE — Patient Instructions (Signed)
Pt. Given surgeon's letter.

## 2022-07-22 NOTE — Progress Notes (Signed)
Nursing Pain Medication Assessment:  Safety precautions to be maintained throughout the outpatient stay will include: orient to surroundings, keep bed in low position, maintain call bell within reach at all times, provide assistance with transfer out of bed and ambulation.  Medication Inspection Compliance: Pill count conducted under aseptic conditions, in front of the patient. Neither the pills nor the bottle was removed from the patient's sight at any time. Once count was completed pills were immediately returned to the patient in their original bottle.  Medication:  Methadone Pill/Patch Count:  50 of 90 pills remain Pill/Patch Appearance: Markings consistent with prescribed medication Bottle Appearance: Standard pharmacy container. Clearly labeled. Filled Date: 09 / 08 / 2023 Last Medication intake:  Today  Pt given surgeon's letter.

## 2022-07-22 NOTE — Progress Notes (Signed)
PROVIDER NOTE: Information contained herein reflects review and annotations entered in association with encounter. Interpretation of such information and data should be left to medically-trained personnel. Information provided to patient can be located elsewhere in the medical record under "Patient Instructions". Document created using STT-dictation technology, any transcriptional errors that may result from process are unintentional.    Patient: Natasha Gonzales  Service Category: E/M  Provider: Edward Jolly, MD  DOB: [birth year]  DOS: 07/22/2022  Specialty: Interventional Pain Management  MRN: [id number]  Setting: Ambulatory outpatient  PCP: Franciso Bend, NP  Type: Established Patient    Referring Provider: Franciso Bend, NP  Location: Office  Delivery: Face-to-face     HPI  Natasha Gonzales, a 67 y.o. year old female, is here today because of her Chronic pain syndrome [G89.4]. Ms. Spiegelman primary complain today is Back Pain (lower)  Last encounter: My last encounter with her was on 04/15/22  Pertinent problems: Ms. 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 Pain Assessment: Severity of Chronic pain is reported as a 3 /10. Location: Back Right/around hips bilat to groin, down right leg toes. Onset:  . Quality: Constant, Sharp. Timing:  . Modifying factor(s): meds. Vitals:  height is 5\' 2"  (1.575 m) and weight is 175 lb (79.4 kg). Her temporal temperature is 97.7 F (36.5 C). Her blood pressure is 167/68 (abnormal) and her pulse is 86. Her oxygen saturation is 100%.   Reason for encounter: medication management.    Patient continues multimodal pain regimen as prescribed.  States that it provides pain  relief and improvement in functional status. Has had an abnormal finding on mammogram, concern for papilloma, recommended surgical excision but patient holding off at this time, will make a decision soon. QTc from EKG on 05/29/2022 434 ms, will repeat next year Ok to continue methadone.   Pharmacotherapy Assessment  Analgesic: Methadone 5 mg TID prn #90/month, MME= 45   Monitoring: [place] PMP: PDMP reviewed during this encounter.       Pharmacotherapy: No side-effects or adverse reactions reported. Compliance: No problems identified. Effectiveness: Clinically acceptable.  UDS:  Summary  Date Value Ref Range Status  04/15/2022 Note  Final    Comment:    ==================================================================== [employer] Select 13 (MW) ==================================================================== Test                             Result       Flag       Units  Drug Present and Declared for Prescription Verification   Methadone                      3271         EXPECTED   ng/mg creat   EDDP (Methadone Mtb)           2931         EXPECTED   ng/mg creat    Sources of methadone include scheduled prescription medications.    EDDP is an expected metabolite of methadone.  ==================================================================== Test                      Result    Flag   Units  Ref Range   Creatinine              108              mg/dL      >=20 ==================================================================== Declared Medications:  The flagging and interpretation on this report are based on the  following declared medications.  Unexpected results may arise from  inaccuracies in the declared medications.   **Note: The testing scope of this panel includes these medications:   Methadone (Dolophine)   **Note: The testing scope of this panel does not include the  following reported medications:   Aspirin  Buspirone (Buspar)  Desloratadine (Clarinex)   Hydroxychloroquine (Plaquenil)  Ibuprofen (Advil)  Leflunomide (Arava)  Lisinopril (Zestril)  Meloxicam (Mobic)  Montelukast (Singulair)  Multivitamin  Rosuvastatin (Crestor)  Turmeric ==================================================================== For clinical consultation, please call [phone]. ====================================================================       ROS  Constitutional: Denies any fever or chills Gastrointestinal: No reported hemesis, hematochezia, vomiting, or acute GI distress Musculoskeletal:  low back pain Neurological: No reported episodes of acute onset apraxia, aphasia, dysarthria, agnosia, amnesia, paralysis, loss of coordination, or loss of consciousness  Medication Review  Turmeric, aspirin EC, busPIRone, hydroxychloroquine, ibuprofen, leflunomide, lisinopril, meloxicam, methadone, montelukast, multivitamin, and rosuvastatin  History Review  Allergy: Ms. Melder is allergic to erythromycin base. Drug: Ms. Hummel  reports no history of drug use. Alcohol:  reports that she does not currently use alcohol. Tobacco:  reports that she has been smoking cigarettes. She has never used smokeless tobacco. Social: Ms. Embree  reports that she has been smoking cigarettes. She has never used smokeless tobacco. She reports that she does not currently use alcohol. She reports that she does not use drugs. Medical:  has a past medical history of Anxiety, Depression, Generalized headaches, Hyperlipidemia, Hypertension, Ovarian cyst, and Rheumatoid arthritis ([place]). Surgical: Ms. Seely  has a past surgical history that includes Tonsilectomy/adenoidectomy with myringotomy (1984); Abdominal hysterectomy (1993); adhesion (2001); Ovarian cyst removal; Joint replacement; Breast biopsy (Left, 06/25/2022); Breast biopsy (Left, 06/25/2022); Breast biopsy (Left, 06/25/2022); and Breast biopsy (Left, 06/25/2022). Family: family history includes Cancer in her brother and  another family member; Heart disease in her father and mother; Hypertension in her mother; Kidney disease in her mother.  Laboratory Chemistry Profile   Renal Lab Results  Component Value Date   BUN 13 01/24/2014   CREATININE 1.05 01/24/2014   GFRAA >60 01/24/2014   GFRNONAA 59 (L) 01/24/2014     Hepatic Lab Results  Component Value Date   AST 22 01/24/2014   ALT 30 01/24/2014   ALBUMIN 4.2 01/24/2014   ALKPHOS 74 01/24/2014   LIPASE 129 01/24/2014     Electrolytes Lab Results  Component Value Date   NA 138 01/24/2014   K 4.0 01/24/2014   CL 104 01/24/2014   CALCIUM 8.9 01/24/2014     Bone No results found for: "VD25OH", "VD125OH2TOT", "GE9528UX3", "KG4010UV2", "25OHVITD1", "25OHVITD2", "25OHVITD3", "TESTOFREE", "TESTOSTERONE"   Inflammation (CRP: Acute Phase) (ESR: Chronic Phase) No results found for: "CRP", "ESRSEDRATE", "LATICACIDVEN"     Note: Above Lab results reviewed.  Recent Imaging Review  MM LT BREAST BX W LOC DEV EA AD LESION IMG BX SPEC STEREO GUIDE Addendum: ADDENDUM REPORT: 06/27/2022 09:37   ADDENDUM:  PATHOLOGY revealed: Site A. BREAST, LEFT UPPER OUTER QUADRANT,  POSTERIOR DEPTH; STEREOTACTIC CORE NEEDLE BIOPSY:- BENIGN MAMMARY  PARENCHYMA WITH FIBROCYSTIC AND FIBROADENOMATOID CHANGES, WITH  ASSOCIATED COARSE DYSTROPHIC CALCIFICATIONS. - FOCAL COLUMNAR CELL  CHANGE WITHOUT ATYPIA, AND MINUTE  INTRADUCTAL PAPILLOMA. - NEGATIVE  FOR ATYPICAL PROLIFERATIVE BREAST DISEASE.   Pathology results are CONCORDANT with imaging findings, per Dr.  Kristopher Oppenheim with consideration of excision.   PATHOLOGY revealed: Site B. BREAST, LEFT UPPER INNER QUADRANT,  MIDDLE DEPTH; STEREOTACTIC CORE NEEDLE BIOPSY: - BENIGN MAMMARY  PARENCHYMA DISPLAYING CHANGES COMPATIBLE WITH RADIAL SCAR/COMPLEX  SCLEROSING LESION, WITH ASSOCIATED FIBROCYSTIC CHANGES, SCLEROSING  ADENOSIS, AND COARSE DYSTROPHIC CALCIFICATIONS. - NEGATIVE FOR  ATYPICAL PROLIFERATIVE BREAST  DISEASE.   Pathology results are CONCORDANT with imaging findings, per Dr.  Kristopher Oppenheim with consideration of excision.   Pathology results and recommendations below were discussed with  patient by telephone on 06/26/2022. Patient reported biopsy site  within normal limits with slight tenderness at the site. Post biopsy  care instructions were reviewed, questions were answered and my  direct phone number was provided to patient. Patient was instructed  to call [facility] if any concerns or questions arise  related to the biopsy.   RECOMMENDATION: Surgical consultation for possible excision for both  sites A and B. (Note: Site A is a small incidental papilloma, which  may be considered for excision if Site B undergoes surgical  removal). Request for surgical consultation relayed to Casper Harrison RN  at [facility] by Electa Sniff RN on 06/26/2021.   Pathology results reported by Electa Sniff RN on 06/26/2022.   Electronically Signed    By: Kristopher Oppenheim M.D.    On: 06/27/2022 09:37 Narrative: CLINICAL DATA:  67 year old female with 2 groups of indeterminate left breast calcifications.  EXAM: LEFT BREAST STEREOTACTIC CORE NEEDLE BIOPSY  COMPARISON:  Previous exam(s).  FINDINGS: The patient and I discussed the procedure of stereotactic-guided biopsy including benefits and alternatives. We discussed the high likelihood of a successful procedure. We discussed the risks of the procedure including infection, bleeding, tissue injury, clip migration, and inadequate sampling. Informed written consent was given. The usual time out protocol was performed immediately prior to the procedure.  Lesion quadrant: Upper outer quadrant  Using sterile technique and 1% Lidocaine as local anesthetic, under stereotactic guidance, a 9 gauge vacuum assisted device was used to perform core needle biopsy of calcifications in the upper outer left breast at posterior depth using  a superior approach. Specimen radiograph was performed showing calcifications in several specimens. Specimens with calcifications are identified for pathology. At the conclusion of the procedure, a ribbon shaped tissue marker clip was deployed into the biopsy cavity.  Lesion quadrant: Upper inner quadrant  Using sterile technique and 1% Lidocaine as local anesthetic, under stereotactic guidance, a 9 gauge vacuum assisted device was used to perform core needle biopsy of calcifications in the upper inner left breast at mid depth using a superior approach. Specimen radiograph was performed showing calcifications in several specimens. Specimens with calcifications are identified for pathology. At the conclusion of the procedure, a coil shaped tissue marker clip was deployed into the biopsy cavity.  Follow-up 2-view mammogram was performed and dictated separately.  IMPRESSION: Stereotactic-guided biopsy of the left breast x2. No apparent complications.  Electronically Signed: By: Kristopher Oppenheim M.D. On: 06/25/2022 11:36 MM LT BREAST BX W LOC DEV 1ST LESION IMAGE BX SPEC STEREO GUIDE Addendum: ADDENDUM REPORT: 06/27/2022 09:37   ADDENDUM:  PATHOLOGY revealed: Site A. BREAST, LEFT UPPER OUTER QUADRANT,  POSTERIOR DEPTH; STEREOTACTIC CORE NEEDLE BIOPSY:- BENIGN MAMMARY  PARENCHYMA WITH FIBROCYSTIC AND FIBROADENOMATOID CHANGES, WITH  ASSOCIATED COARSE DYSTROPHIC CALCIFICATIONS. - FOCAL COLUMNAR CELL  CHANGE WITHOUT ATYPIA, AND MINUTE  INTRADUCTAL PAPILLOMA. - NEGATIVE  FOR ATYPICAL PROLIFERATIVE BREAST DISEASE.   Pathology results are CONCORDANT with imaging findings, per Dr.  Sande Brothers with consideration of excision.   PATHOLOGY revealed: Site B. BREAST, LEFT UPPER INNER QUADRANT,  MIDDLE DEPTH; STEREOTACTIC CORE NEEDLE BIOPSY: - BENIGN MAMMARY  PARENCHYMA DISPLAYING CHANGES COMPATIBLE WITH RADIAL SCAR/COMPLEX  SCLEROSING LESION, WITH ASSOCIATED FIBROCYSTIC CHANGES, SCLEROSING   ADENOSIS, AND COARSE DYSTROPHIC CALCIFICATIONS. - NEGATIVE FOR  ATYPICAL PROLIFERATIVE BREAST DISEASE.   Pathology results are CONCORDANT with imaging findings, per Dr.  Sande Brothers with consideration of excision.   Pathology results and recommendations below were discussed with  patient by telephone on 06/26/2022. Patient reported biopsy site  within normal limits with slight tenderness at the site. Post biopsy  care instructions were reviewed, questions were answered and my  direct phone number was provided to patient. Patient was instructed  to call [facility] if any concerns or questions arise  related to the biopsy.   RECOMMENDATION: Surgical consultation for possible excision for both  sites A and B. (Note: Site A is a small incidental papilloma, which  may be considered for excision if Site B undergoes surgical  removal). Request for surgical consultation relayed to Irving Shows RN  at [facility] by Randa Lynn RN on 06/26/2021.   Pathology results reported by Randa Lynn RN on 06/26/2022.   Electronically Signed    By: Sande Brothers M.D.    On: 06/27/2022 09:37 Narrative: CLINICAL DATA:  67 year old female with 2 groups of indeterminate left breast calcifications.  EXAM: LEFT BREAST STEREOTACTIC CORE NEEDLE BIOPSY  COMPARISON:  Previous exam(s).  FINDINGS: The patient and I discussed the procedure of stereotactic-guided biopsy including benefits and alternatives. We discussed the high likelihood of a successful procedure. We discussed the risks of the procedure including infection, bleeding, tissue injury, clip migration, and inadequate sampling. Informed written consent was given. The usual time out protocol was performed immediately prior to the procedure.  Lesion quadrant: Upper outer quadrant  Using sterile technique and 1% Lidocaine as local anesthetic, under stereotactic guidance, a 9 gauge vacuum assisted device was used to perform  core needle biopsy of calcifications in the upper outer left breast at posterior depth using a superior approach. Specimen radiograph was performed showing calcifications in several specimens. Specimens with calcifications are identified for pathology. At the conclusion of the procedure, a ribbon shaped tissue marker clip was deployed into the biopsy cavity.  Lesion quadrant: Upper inner quadrant  Using sterile technique and 1% Lidocaine as local anesthetic, under stereotactic guidance, a 9 gauge vacuum assisted device was used to perform core needle biopsy of calcifications in the upper inner left breast at mid depth using a superior approach. Specimen radiograph was performed showing calcifications in several specimens. Specimens with calcifications are identified for pathology. At the conclusion of the procedure, a coil shaped tissue marker clip was deployed into the biopsy cavity.  Follow-up 2-view mammogram was performed and dictated separately.  IMPRESSION: Stereotactic-guided biopsy of the left breast x2. No apparent complications.  Electronically Signed: By: Sande Brothers M.D. On: 06/25/2022 11:36  Note: Reviewed        Physical Exam  General appearance: Well nourished, well developed, and well hydrated. In no apparent acute distress Mental status: Alert, oriented x 3 (person, place, & time)       Respiratory: No evidence of acute respiratory distress Eyes: PERLA Vitals: BP (!) 167/68   Pulse 86   Temp 97.7  F (36.5 C) (Temporal)   Ht 5\' 2"  (1.575 m)   Wt 175 lb (79.4 kg)   SpO2 100%   BMI 32.01 kg/m  BMI: Estimated body mass index is 32.01 kg/m as calculated from the following:   Height as of this encounter: 5\' 2"  (1.575 m).   Weight as of this encounter: 175 lb (79.4 kg). Ideal: Ideal body weight: 50.1 kg (110 lb 7.2 oz) Adjusted ideal body weight: 61.8 kg (136 lb 4.3 oz)  Lumbar spine: Skin & Axial Inspection: No masses, redness, or swelling Alignment:  Symmetrical Functional ROM: Unrestricted ROM       Stability: No instability detected Muscle Tone/Strength: Functionally intact. No obvious neuro-muscular anomalies detected. Sensory (Neurological): Musculoskeletal pain pattern  Gait & Posture Assessment  Ambulation: Unassisted Gait: Relatively normal for age and body habitus Posture: WNL    Lower Extremity Exam      Side: Right lower extremity   Side: Left lower extremity  Stability: No instability observed           Stability: No instability observed          Skin & Extremity Inspection: Skin color, temperature, and hair growth are WNL. No peripheral edema or cyanosis. No masses, redness, swelling, asymmetry, or associated skin lesions. No contractures.   Skin & Extremity Inspection: Skin color, temperature, and hair growth are WNL. No peripheral edema or cyanosis. No masses, redness, swelling, asymmetry, or associated skin lesions. No contractures.  Functional ROM: pain restricted           Functional ROM: Unrestricted ROM                  Muscle Tone/Strength: Functionally intact. No obvious neuro-muscular anomalies detected.   Muscle Tone/Strength: Functionally intact. No obvious neuro-muscular anomalies detected.  Sensory (Neurological): Musculoskeletal pain pattern, severe   Sensory (Neurological): Musculoskeletal pain pattern        DTR: Patellar: deferred today Achilles: deferred today Plantar: deferred today   DTR: Patellar: deferred today Achilles: deferred today Plantar: deferred today  Palpation: No palpable anomalies   Palpation: No palpable anomalies    Assessment   Status Diagnosis  Controlled Controlled Controlled 1. Chronic pain syndrome   2. 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 Sacroiliac joint pain   4. Primary osteoarthritis of right knee   5. Rheumatoid arthritis involving multiple sites, unspecified whether rheumatoid factor present (HCC)   6. Chronic pain of right knee           Plan of Care   Ms. ARKIE TAGLIAFERRO has a current medication list which includes the following long-term medication(s): lisinopril, montelukast, and rosuvastatin.  1. Chronic pain syndrome - methadone (DOLOPHINE) 5 MG tablet; Take 1 tablet (5 mg total) by mouth every 8 (eight) hours. Must last 30 days.  Dispense: 90 tablet; Refill: 0 - methadone (DOLOPHINE) 5 MG tablet; Take 1 tablet (5 mg total) by mouth every 8 (eight) hours. Must last 30 days.  Dispense: 90 tablet; Refill: 0 - methadone (DOLOPHINE) 5 MG tablet; Take 1 tablet (5 mg total) by mouth every 8 (eight) hours. Must last 30 days.  Dispense: 90 tablet; Refill: 0  2. 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 Sacroiliac joint pain  4. Primary osteoarthritis of right knee  5. Rheumatoid arthritis involving multiple sites, unspecified whether rheumatoid factor present (HCC)  6. Chronic pain of right knee   Updated EKG and urine toxicology, repeat next year  Pharmacotherapy (Medications Ordered): Meds ordered this encounter  Medications   methadone (DOLOPHINE) 5 MG tablet    Sig: Take 1 tablet (5 mg total) by mouth every 8 (eight) hours. Must last 30 days.    Dispense:  90 tablet    Refill:  0    [place] STOP ACT - Not applicable. Fill one day early if pharmacy is closed on scheduled refill date.   methadone (DOLOPHINE) 5 MG tablet    Sig: Take 1 tablet (5 mg total) by mouth every 8 (eight) hours. Must last 30 days.    Dispense:  90 tablet    Refill:  0    [place] STOP ACT - Not applicable. Fill one day early if pharmacy is closed on scheduled refill date.   methadone (DOLOPHINE) 5 MG tablet    Sig: Take 1 tablet (5 mg total) by mouth every 8 (eight) hours. Must last 30 days.    Dispense:  90 tablet    Refill:  0    [place] STOP ACT - Not applicable. Fill one day early if pharmacy is closed on scheduled refill date.   No orders of the defined types were placed in this encounter.    Follow-up plan:   Return in  about 3 months (around 10/30/2022) for Medication Management, in person.    Recent Visits No visits were found meeting these conditions. Showing recent visits within past 90 days and meeting all other requirements Today's Visits Date Type Provider Dept  07/22/22 Office Visit Gillis Santa, MD [facility]  Showing today's visits and meeting all other requirements Future Appointments No visits were found meeting these conditions. Showing future appointments within next 90 days and meeting all other requirements  I discussed the assessment and treatment plan with the patient. The patient was provided an opportunity to ask questions and all were answered. The patient agreed with the plan and demonstrated an understanding of the instructions.  Patient advised to call back or seek an in-person evaluation if the symptoms or condition worsens.  Duration of encounter: 30 minutes.  Note by: Gillis Santa, MD Date: 07/22/2022; Time: 11:55 AM

## 2022-10-14 ENCOUNTER — Ambulatory Visit (INDEPENDENT_AMBULATORY_CARE_PROVIDER_SITE_OTHER): Payer: Managed Care, Other (non HMO) | Admitting: Surgery

## 2022-10-14 ENCOUNTER — Ambulatory Visit: Payer: Self-pay | Admitting: Surgery

## 2022-10-14 ENCOUNTER — Encounter: Payer: Self-pay | Admitting: Surgery

## 2022-10-14 VITALS — BP 138/63 | HR 87 | Temp 98.2°F | Ht 62.0 in | Wt 180.4 lb

## 2022-10-14 DIAGNOSIS — N6489 Other specified disorders of breast: Secondary | ICD-10-CM

## 2022-10-14 DIAGNOSIS — R928 Other abnormal and inconclusive findings on diagnostic imaging of breast: Secondary | ICD-10-CM

## 2022-10-14 NOTE — Progress Notes (Signed)
Patient ID: Natasha Gonzales, female   DOB: [birth year], 67 y.o.   MRN: [id number]  Chief Complaint: Left breast radial scar, papilloma, complex sclerosing lesion.  History of Present Illness  Natasha Gonzales is a 67 y.o. female is over 3 months after the biopsy results as reported below, of 2 separate areas of this patient's left breast.  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  The upper inner quadrant biopsy is consistent with radial scar and complex sclerosing lesion.   During much of our discussion we discussed removing both areas, but in retrospect only the upper inner is actually of significant risk. She has been pregnant 5 times with 3 children.  She has no family history of breast cancer.  She underwent a hysterectomy in her mid 30s and had hormonal therapy postop.  She had used birth control preop.  She began menstruating at the age of 11 and had her first pregnancy at the age of 36.  She breast-fed.  She denies any history of palpable mass, nipple discharge, skin changes or breast pain.   Past Medical History Past Medical History:  Diagnosis Date   Anxiety    Depression    Generalized headaches    Hyperlipidemia    Hypertension    Ovarian cyst    Rheumatoid arthritis       Past Surgical History:  Procedure Laterality Date   ABDOMINAL HYSTERECTOMY  1993   total   adhesion  2001   laprascopic   BREAST BIOPSY Left 06/25/2022   UOQ CALCS, STEREO BX, PATH PENIDNG   BREAST BIOPSY Left 06/25/2022   UIQ CALCS, STEREO BX, PATH PENDING   BREAST BIOPSY Left 06/25/2022   MM LT BREAST BX W LOC DEV 1ST LESION IMAGE BX SPEC STEREO GUIDE 06/25/2022 [facility]   BREAST BIOPSY Left 06/25/2022   MM LT BREAST BX W LOC DEV EA AD LESION IMG BX SPEC STEREO GUIDE 06/25/2022 [facility]   JOINT REPLACEMENT     OVARIAN CYST REMOVAL     TONSILECTOMY/ADENOIDECTOMY WITH MYRINGOTOMY  1984    Allergies  Allergen Reactions   Erythromycin Base     Other  reaction(s): Gastric reaaction    Current Outpatient Medications  Medication Sig Dispense Refill   aspirin EC 81 MG tablet Take 81 mg by mouth daily.     busPIRone (BUSPAR) 7.5 MG tablet Take 7.5 mg by mouth 3 (three) times daily.     hydroxychloroquine (PLAQUENIL) 200 MG tablet Take 200 mg by mouth daily.      ibuprofen (ADVIL,MOTRIN) 800 MG tablet Take 800 mg by mouth every 6 (six) hours as needed.     leflunomide (ARAVA) 10 MG tablet Take 1 tablet by mouth daily.     lisinopril (ZESTRIL) 20 MG tablet Take 1 tablet (20 mg total) by mouth daily. 10 tablet 0   methadone (DOLOPHINE) 5 MG tablet Take 1 tablet (5 mg total) by mouth every 8 (eight) hours. Must last 30 days. 90 tablet 0   montelukast (SINGULAIR) 10 MG tablet Take 10 mg by mouth at bedtime.     Multiple Vitamin (MULTIVITAMIN) capsule Take 1 capsule by mouth daily.     rosuvastatin (CRESTOR) 40 MG tablet TAKE 1 TABLET BY MOUTH  DAILY 90 tablet 3   TURMERIC PO Take by mouth daily.     No current facility-administered medications for this visit.    Family History Family History  Problem Relation Age of Onset   Cancer Brother  Cancer Other    Hypertension Mother    Heart disease Mother    Kidney disease Mother    Heart disease Father    Breast cancer Neg Hx       Social History Social History   Tobacco Use   Smoking status: Every Day    Years: 25    Types: Cigarettes   Smokeless tobacco: Never   Tobacco comments:    Stress smoker. 1 cig every now and then  Substance Use Topics   Alcohol use: Not Currently   Drug use: Never        Review of Systems  Constitutional: Negative.   HENT:  Positive for hearing loss.   Eyes: Negative.   Respiratory: Negative.    Cardiovascular: Negative.   Gastrointestinal: Negative.   Genitourinary:  Positive for frequency.  Musculoskeletal:  Positive for joint pain.  Skin: Negative.   Neurological:  Positive for headaches.  Psychiatric/Behavioral:  The patient is  nervous/anxious.      Physical Exam Blood pressure 138/63, pulse 87, temperature 98.2 F (36.8 C), temperature source Oral, height 5\' 2"  (1.575 m), weight 180 lb 6.4 oz (81.8 kg), SpO2 95 %. Last Weight  Most recent update: 10/14/2022  1:28 PM    Weight  81.8 kg (180 lb 6.4 oz)             CONSTITUTIONAL: Well developed, and nourished, appropriately responsive and aware without distress.   EYES: Sclera non-icteric.   EARS, NOSE, MOUTH AND THROAT:  The oropharynx is clear. Oral mucosa is pink and moist.    Hearing is intact to voice.  NECK: Trachea is midline, and there is no jugular venous distension.  LYMPH NODES:  Lymph nodes in the neck are not appreciated. RESPIRATORY:   Normal respiratory effort without pathologic use of accessory muscles. CARDIOVASCULAR:   Well perfused.  GI: The abdomen is soft, nontender, and nondistended.   Breast: Marylene Land present as chaperone.  No upper left breast masses, or suspicious nodularity.  No residual ecchymosis or hematoma appreciated.  Multiple scattered seborrheic keratoses of the skin.  MUSCULOSKELETAL:  Symmetrical muscle tone appreciated in all four extremities.    SKIN: Skin turgor is normal. No pathologic skin lesions appreciated.   NEUROLOGIC:  Motor and sensation appear grossly normal.  Cranial nerves are grossly without defect. PSYCH:  Alert and oriented to person, place and time. Affect is appropriate for situation.  Data Reviewed I have personally reviewed what is currently available of the patient's imaging, recent labs and medical records.   Labs:     Latest Ref Rng & Units 01/24/2014    1:25 PM  CBC  WBC 3.6 - 11.0 x10 3/mm 3 5.1   Hemoglobin 12.0 - 16.0 g/dL 16.1   Hematocrit 09.6 - 47.0 % 40.0   Platelets 150 - 440 x10 3/mm 3 272       Latest Ref Rng & Units 01/24/2014    1:25 PM  CMP  Glucose 65 - 99 mg/dL 96   BUN 7 - 18 mg/dL 13   Creatinine 0.45 - 1.30 mg/dL 4.09   Sodium 811 - 914 mmol/L 138   Potassium 3.5 -  5.1 mmol/L 4.0   Chloride 98 - 107 mmol/L 104   CO2 21 - 32 mmol/L 26   Calcium 8.5 - 10.1 mg/dL 8.9   Total Protein 6.4 - 8.2 g/dL 7.9   Total Bilirubin 0.2 - 1.0 mg/dL 0.5   Alkaline Phos Unit/L 74   AST 15 -  37 Unit/L 22   ALT U/L 30    SURGICAL PATHOLOGY CASE: [id number] PATIENT: [name] Surgical Pathology Report  Specimen Submitted: A. Breast, UOQ posterior; left B. Breast, UIQ mid depth; left  Clinical History: Calcs. A - Ribbon-shaped clip placed following stereotactic biopsy of LEFT breast, upper outer quadrant. B - Coil-shaped clip placed following stereotactic biopsy of LEFT breast, upper inner quadrant.  DIAGNOSIS: A. BREAST, LEFT UPPER OUTER QUADRANT, POSTERIOR DEPTH; STEREOTACTIC CORE NEEDLE BIOPSY: - BENIGN MAMMARY PARENCHYMA WITH FIBROCYSTIC AND FIBROADENOMATOID CHANGES, WITH ASSOCIATED COARSE DYSTROPHIC CALCIFICATIONS. - FOCAL COLUMNAR CELL CHANGE WITHOUT ATYPIA, AND MINUTE INTRADUCTAL PAPILLOMA. - NEGATIVE FOR ATYPICAL PROLIFERATIVE BREAST DISEASE.  B. BREAST, LEFT UPPER INNER QUADRANT, MIDDLE DEPTH; STEREOTACTIC CORE NEEDLE BIOPSY: - BENIGN MAMMARY PARENCHYMA DISPLAYING CHANGES COMPATIBLE WITH RADIAL SCAR/COMPLEX SCLEROSING LESION, WITH ASSOCIATED FIBROCYSTIC CHANGES, SCLEROSING ADENOSIS, AND COARSE DYSTROPHIC CALCIFICATIONS. - NEGATIVE FOR ATYPICAL PROLIFERATIVE BREAST DISEASE.    Imaging: Radiological images reviewed:  CLINICAL DATA:  Two groups of calcifications in the left breast seen on most recent screening mammography.   EXAM: DIGITAL DIAGNOSTIC UNILATERAL LEFT MAMMOGRAM WITH TOMOSYNTHESIS   TECHNIQUE: Left digital diagnostic mammography and breast tomosynthesis was performed.   COMPARISON:  Previous exam(s).   ACR Breast Density Category b: There are scattered areas of fibroglandular density.   FINDINGS: Additional mammographic views of the left breast demonstrate 2 groups of indeterminate calcifications, in the left breast  upper outer quadrant, posterior depth, measuring 6 mm, and left breast slightly lower inner quadrant, posterior depth, measuring 8 mm in greatest dimension.   IMPRESSION: Two groups of indeterminate calcifications in the left breast, for which stereotactic core needle biopsies are recommended.   RECOMMENDATION: Two site stereotactic core needle biopsy of the left breast.   I have discussed the findings and recommendations with the patient. If applicable, a reminder letter will be sent to the patient regarding the next appointment.   BI-RADS CATEGORY  4: Suspicious.     Electronically Signed   By: Ted Mcalpine M.D.   On: 06/12/2022 11:32 Within last 24 hrs: No results found.  Assessment    2 lesions in the left breast, both associated with similar radiographic findings.  The upper outer quadrant lesion has a minute papilloma that is benign.  It is with vacuum-assisted biopsy technique and its likelihood of upgrading to something worse is very low. The upper inner quadrant lesion has complex sclerosing/radial scar. 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  She realizes that her  risk is reasonably low for upstaging her disease, but would like to do peace of mind.  Therefore we will schedule excisional biopsy, tag directed left breast x 2.  We discussed  at length the pros and cons of continued observation versus proceeding with excisional biopsy at this time.  She is well aware that the majority of patients do not have an upgrade or worsening of their diagnosis, however there is always a risk of missing/or delaying risk reducing treatment.  At present she is undecided, but feels that being more aggressive surgically may provide her the peace of mind, especially in lieu of further breast imaging follow-up.  Radial scars -- Complex Sclerosing Lesion (CSL) Radial scars, also called complex sclerosing lesions, are a pathologic diagnosis, usually discovered incidentally when a breast mass or radiologic abnormality is removed or biopsied. Occasionally, radial scars are large enough to be detected on mammography as suspicious spiculated masses, which cannot be reliably differentiated from spiculated carcinoma by imaging alone. Radial scars are characterized microscopically by a fibroelastic core with radiating ducts and lobules. In general, surgical excision is recommended when radial scars or complex sclerosing lesions are diagnosed on core biopsy, based on series showing that 8 to 17 percent of surgical specimens at subsequent excision are positive for malignancy. These high upgrade rates have led to the suggestion that these lesions may actually be premalignant lesions, progressing from scar to hyperplasia to carcinoma. This, however, is controversial, and more recent series suggest the presence of undiagnosed cancer is much lower, with upgrade rates in the range of 0.6 to 3.6 percent. The upgrade rate is even lower with large-volume (eg, vacuum-assisted) needle biopsy.   In a meta-analysis of over 3000 patients with radial scars, the upgrade rate after vacuum-assisted biopsy to  invasive and in situ cancer was only 0 and 1 percent, respectively. In a study of 50 patients with radial scar who were followed by active surveillance, no patient progressed on interval imaging at 16 months. The current recommendations from the American Society of Breast Surgeons state that most radial scars "should be excised, although imaging follow-up is reasonable for small, image-detected radial scars that are completely removed or well-sampled with large-gauge devices and in the setting of imaging-pathology concordance".  No additional treatment beyond excision is needed for radial scars. The risk of subsequent breast cancer after excision in this population is small, and chemoprevention is not indicated.  Face-to-face time spent with the patient and accompanying care providers(if present) was 40 minutes, with more than 50% of the time spent counseling, educating, and coordinating care of the patient.    These notes generated with voice recognition software. I apologize for typographical errors.  Campbell Lerner M.D., FACS 10/14/2022, 1:54 PM

## 2022-10-14 NOTE — Patient Instructions (Signed)
Our surgery scheduler Britta Mccreedy will call you within 24-48 hours to get you scheduled. If you have not heard from her after 48 hours, please call our office. Have the blue sheet available when she calls to write down important information.   If you have any concerns or questions, please feel free to call our office.   Breast Self-Awareness Breast self-awareness is knowing how your breasts look and feel. You need to: Check your breasts on a regular basis. Tell your doctor about any changes. Become familiar with the look and feel of your breasts. This can help you catch a breast problem while it is still small and can be treated. You should do breast self-exams even if you have breast implants. What you need: A mirror. A well-lit room. A pillow or other soft object. How to do a breast self-exam Follow these steps to do a breast self-exam: Look for changes  Take off all the clothes above your waist. Stand in front of a mirror in a room with good lighting. Put your hands down at your sides. Compare your breasts in the mirror. Look for any difference between them, such as: A difference in shape. A difference in size. Wrinkles, dips, and bumps in one breast and not the other. Look at each breast for changes in the skin, such as: Redness. Scaly areas. Skin that has gotten thicker. Dimpling. Open sores (ulcers). Look for changes in your nipples, such as: Fluid coming out of a nipple. Fluid around a nipple. Bleeding. Dimpling. Redness. A nipple that looks pushed in (retracted), or that has changed position. Feel for changes Lie on your back. Feel each breast. To do this: Pick a breast to feel. Place a pillow under the shoulder closest to that breast. Put the arm closest to that breast behind your head. Feel the nipple area of that breast using the hand of your other arm. Feel the area with the pads of your three middle fingers by making small circles with your fingers. Use light,  medium, and firm pressure. Continue the overlapping circles, moving downward over the breast. Keep making circles with your fingers. Stop when you feel your ribs. Start making circles with your fingers again, this time going upward until you reach your collarbone. Then, make circles outward across your breast and into your armpit area. Squeeze your nipple. Check for discharge and lumps. Repeat these steps to check your other breast. Sit or stand in the tub or shower. With soapy water on your skin, feel each breast the same way you did when you were lying down. Write down what you find Writing down what you find can help you remember what to tell your doctor. Write down: What is normal for each breast. Any changes you find in each breast. These include: The kind of changes you find. A tender or painful breast. Any lump you find. Write down its size and where it is. When you last had your monthly period (menstrual cycle). General tips If you are breastfeeding, the best time to check your breasts is after you feed your baby or after you use a breast pump. If you get monthly bleeding, the best time to check your breasts is 5-7 days after your monthly cycle ends. With time, you will become comfortable with the self-exam. You will also start to know if there are changes in your breasts. Contact a doctor if: You see a change in the shape or size of your breasts or nipples. You see a change  in the skin of your breast or nipples, such as red or scaly skin. You have fluid coming from your nipples that is not normal. You find a new lump or thick area. You have breast pain. You have any concerns about your breast health. Summary Breast self-awareness includes looking for changes in your breasts and feeling for changes within your breasts. You should do breast self-awareness in front of a mirror in a well-lit room. If you get monthly periods (menstrual cycles), the best time to check your breasts is  5-7 days after your period ends. Tell your doctor about any changes you see in your breasts. Changes include changes in size, changes on the skin, painful or tender breasts, or fluid from your nipples that is not normal. This information is not intended to replace advice given to you by your health care provider. Make sure you discuss any questions you have with your health care provider. Document Revised: 11/14/2021 Document Reviewed: 04/11/2021 Elsevier Patient Education  2023 [employer].

## 2022-10-15 ENCOUNTER — Other Ambulatory Visit: Payer: Self-pay | Admitting: Surgery

## 2022-10-15 DIAGNOSIS — R921 Mammographic calcification found on diagnostic imaging of breast: Secondary | ICD-10-CM

## 2022-10-15 DIAGNOSIS — R928 Other abnormal and inconclusive findings on diagnostic imaging of breast: Secondary | ICD-10-CM

## 2022-10-15 DIAGNOSIS — N6489 Other specified disorders of breast: Secondary | ICD-10-CM

## 2022-10-15 DIAGNOSIS — N6012 Diffuse cystic mastopathy of left breast: Secondary | ICD-10-CM

## 2022-10-16 ENCOUNTER — Other Ambulatory Visit: Payer: Self-pay | Admitting: Surgery

## 2022-10-16 DIAGNOSIS — N6489 Other specified disorders of breast: Secondary | ICD-10-CM

## 2022-10-16 DIAGNOSIS — R928 Other abnormal and inconclusive findings on diagnostic imaging of breast: Secondary | ICD-10-CM

## 2022-10-16 DIAGNOSIS — R921 Mammographic calcification found on diagnostic imaging of breast: Secondary | ICD-10-CM

## 2022-10-16 DIAGNOSIS — N6012 Diffuse cystic mastopathy of left breast: Secondary | ICD-10-CM

## 2022-10-20 ENCOUNTER — Telehealth: Payer: Self-pay | Admitting: Surgery

## 2022-10-20 NOTE — Telephone Encounter (Signed)
Patient has been advised of Pre-Admission date/time, and Surgery date at [facility].  Surgery Date: 11/26/22 Preadmission Testing Date: 11/18/22 (phone 8a-1p)  Patient has been made aware to call [phone], between 1-3:00pm the day before surgery, to find out what time to arrive for surgery.    Patient also reminded of her RF tag x 2 to be placed at the [facility] on 11/05/22.

## 2022-10-28 ENCOUNTER — Ambulatory Visit
Payer: Managed Care, Other (non HMO) | Attending: Student in an Organized Health Care Education/Training Program | Admitting: Student in an Organized Health Care Education/Training Program

## 2022-10-28 ENCOUNTER — Encounter: Payer: Self-pay | Admitting: Student in an Organized Health Care Education/Training Program

## 2022-10-28 VITALS — BP 149/92 | HR 83 | Temp 98.0°F | Resp 16 | Ht 62.0 in | Wt 175.0 lb

## 2022-10-28 DIAGNOSIS — M5136 Other intervertebral disc degeneration, lumbar region: Secondary | ICD-10-CM | POA: Insufficient documentation

## 2022-10-28 DIAGNOSIS — G894 Chronic pain syndrome: Secondary | ICD-10-CM | POA: Diagnosis not present

## 2022-10-28 DIAGNOSIS — Z0289 Encounter for other administrative examinations: Secondary | ICD-10-CM | POA: Diagnosis not present

## 2022-10-28 DIAGNOSIS — Z79891 Long term (current) use of opiate analgesic: Secondary | ICD-10-CM | POA: Insufficient documentation

## 2022-10-28 MED ORDER — METHADONE HCL 5 MG PO TABS
5.0000 mg | ORAL_TABLET | Freq: Three times a day (TID) | ORAL | 0 refills | Status: DC
Start: 2023-01-08 — End: 2023-01-29

## 2022-10-28 MED ORDER — METHADONE HCL 5 MG PO TABS
5.0000 mg | ORAL_TABLET | Freq: Three times a day (TID) | ORAL | 0 refills | Status: AC
Start: 2022-12-09 — End: 2023-01-08

## 2022-10-28 MED ORDER — METHOCARBAMOL 500 MG PO TABS: 500.0000 mg | ORAL_TABLET | Freq: Two times a day (BID) | ORAL | 2 refills | Status: DC | PRN

## 2022-10-28 MED ORDER — METHADONE HCL 5 MG PO TABS
5.0000 mg | ORAL_TABLET | Freq: Three times a day (TID) | ORAL | 0 refills | Status: AC
Start: 2022-11-09 — End: 2022-12-09

## 2022-10-28 NOTE — Progress Notes (Signed)
PROVIDER NOTE: Information contained herein reflects review and annotations entered in association with encounter. Interpretation of such information and data should be left to medically-trained personnel. Information provided to patient can be located elsewhere in the medical record under "Patient Instructions". Document created using STT-dictation technology, any transcriptional errors that may result from process are unintentional.    Patient: Natasha Gonzales  Service Category: E/M  Provider: Edward Jolly, MD  DOB: 1956-02-07  DOS: 10/28/2022  Specialty: Interventional Pain Management  MRN: [id number]  Setting: Ambulatory outpatient  PCP: Franciso Bend, NP  Type: Established Patient    Referring Provider: Franciso Bend, NP  Location: Office  Delivery: Face-to-face     HPI  [name], a 67 y.o. year old female, is here today because of her Chronic pain syndrome [G89.4]. Ms. Crosswhite primary complain today is Back Pain (Lumbar L4,5 bilateral )  Last encounter: My last encounter with her was on 07/22/22  Pertinent problems: Ms. Krogstad has Anxiety; Essential hypertension; Rheumatoid arthritis involving multiple sites with positive rheumatoid factor (HCC); Chronic pain syndrome; Lumbar degenerative disc disease; Sacroiliac joint pain; Encounter for long-term methadone use for pain control; and Pain management contract signed on their pertinent problem list. Pain Assessment: Severity of Chronic pain is reported as a 2 /10. Location: Back Lower, Left, Right/radiates around to abodmen and into pelvis and groin and down inside of right leg to the bottom of the foot.. Onset: More than a month ago. Quality: Discomfort, Sharp, Other (Comment) (hot). Timing: Constant. Modifying factor(s): heat, moving about, medications. Vitals:  height is 5\' 2"  (1.575 m) and weight is 175 lb (79.4 kg). Her temporal temperature is 98 F (36.7 C). Her blood pressure is 149/92 (abnormal) and her pulse is 83. Her  respiration is 16 and oxygen saturation is 97%.   Reason for encounter: medication management.    Patient continues multimodal pain regimen as prescribed.  States that it provides pain relief and improvement in functional status. 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   Pharmacotherapy Assessment  Analgesic: Methadone 5 mg TID prn #90/month, MME= 45   Monitoring: [place] PMP: PDMP reviewed during this encounter.       Pharmacotherapy: No side-effects or adverse reactions reported. Compliance: No problems identified. Effectiveness: Clinically acceptable.  UDS:  Summary  Date Value Ref Range Status  04/15/2022 Note  Final    Comment:    ==================================================================== [employer] Select 13 (MW) ==================================================================== Test                             Result       Flag       Units  Drug Present and Declared for Prescription Verification   Methadone                      3271         EXPECTED   ng/mg creat   EDDP (Methadone Mtb)           2931         EXPECTED   ng/mg creat    Sources of methadone include scheduled prescription medications.    EDDP is an expected metabolite of methadone.  ==================================================================== Test                      Result  Flag   Units      Ref Range   Creatinine              108              mg/dL      >=16 ==================================================================== Declared Medications:  The flagging and interpretation on this report are based on the  following declared medications.  Unexpected results may arise from  inaccuracies in the declared medications.   **Note: The testing scope of this panel includes these medications:   Methadone (Dolophine)   **Note: The testing scope of this panel does not include the  following reported medications:    Aspirin  Buspirone (Buspar)  Desloratadine (Clarinex)  Hydroxychloroquine (Plaquenil)  Ibuprofen (Advil)  Leflunomide (Arava)  Lisinopril (Zestril)  Meloxicam (Mobic)  Montelukast (Singulair)  Multivitamin  Rosuvastatin (Crestor)  Turmeric ==================================================================== For clinical consultation, please call [phone]. ====================================================================       ROS  Constitutional: Denies any fever or chills Gastrointestinal: No reported hemesis, hematochezia, vomiting, or acute GI distress Musculoskeletal:  low back pain Neurological: No reported episodes of acute onset apraxia, aphasia, dysarthria, agnosia, amnesia, paralysis, loss of coordination, or loss of consciousness  Medication Review  Turmeric, aspirin EC, busPIRone, hydroxychloroquine, ibuprofen, leflunomide, lisinopril, methadone, methocarbamol, montelukast, multivitamin, rosuvastatin, and sulfaSALAzine  History Review  Allergy: Ms. Guymon is allergic to erythromycin base. Drug: Ms. Reichow  reports no history of drug use. Alcohol:  reports that she does not currently use alcohol. Tobacco:  reports that she has been smoking cigarettes. She has never used smokeless tobacco. Social: Ms. Loafman  reports that she has been smoking cigarettes. She has never used smokeless tobacco. She reports that she does not currently use alcohol. She reports that she does not use drugs. Medical:  has a past medical history of Anxiety, Depression, Generalized headaches, Hyperlipidemia, Hypertension, Ovarian cyst, and Rheumatoid arthritis (HCC). Surgical: Ms. Ibbotson  has a past surgical history that includes Tonsilectomy/adenoidectomy with myringotomy (1984); Abdominal hysterectomy (1993); adhesion (2001); Ovarian cyst removal; Joint replacement; Breast biopsy (Left, 06/25/2022); Breast biopsy (Left, 06/25/2022); Breast biopsy (Left, 06/25/2022); and Breast biopsy (Left,  06/25/2022). Family: family history includes Cancer in her brother and another family member; Heart disease in her father and mother; Hypertension in her mother; Kidney disease in her mother.  Laboratory Chemistry Profile   Renal Lab Results  Component Value Date   BUN 13 01/24/2014   CREATININE 1.05 01/24/2014   GFRAA >60 01/24/2014   GFRNONAA 59 (L) 01/24/2014     Hepatic Lab Results  Component Value Date   AST 22 01/24/2014   ALT 30 01/24/2014   ALBUMIN 4.2 01/24/2014   ALKPHOS 74 01/24/2014   LIPASE 129 01/24/2014     Electrolytes Lab Results  Component Value Date   NA 138 01/24/2014   K 4.0 01/24/2014   CL 104 01/24/2014   CALCIUM 8.9 01/24/2014     Bone No results found for: "VD25OH", "VD125OH2TOT", "WJ1914NW2", "NF6213YQ6", "25OHVITD1", "25OHVITD2", "25OHVITD3", "TESTOFREE", "TESTOSTERONE"   Inflammation (CRP: Acute Phase) (ESR: Chronic Phase) No results found for: "CRP", "ESRSEDRATE", "LATICACIDVEN"     Note: Above Lab results reviewed.  Recent Imaging Review  MM LT BREAST BX W LOC DEV EA AD LESION IMG BX SPEC STEREO GUIDE Addendum: ADDENDUM REPORT: 06/27/2022 09:37   ADDENDUM:  PATHOLOGY revealed: Site A. BREAST, LEFT UPPER OUTER QUADRANT,  POSTERIOR DEPTH; STEREOTACTIC CORE NEEDLE BIOPSY:- BENIGN MAMMARY  PARENCHYMA WITH FIBROCYSTIC AND FIBROADENOMATOID CHANGES, WITH  ASSOCIATED COARSE DYSTROPHIC CALCIFICATIONS. -  FOCAL COLUMNAR CELL  CHANGE WITHOUT ATYPIA, AND MINUTE INTRADUCTAL PAPILLOMA. - NEGATIVE  FOR ATYPICAL PROLIFERATIVE BREAST DISEASE.   Pathology results are CONCORDANT with imaging findings, per Dr.  Sande Brothers with consideration of excision.   PATHOLOGY revealed: Site B. BREAST, LEFT UPPER INNER QUADRANT,  MIDDLE DEPTH; STEREOTACTIC CORE NEEDLE BIOPSY: - BENIGN MAMMARY  PARENCHYMA DISPLAYING CHANGES COMPATIBLE WITH RADIAL SCAR/COMPLEX  SCLEROSING LESION, WITH ASSOCIATED FIBROCYSTIC CHANGES, SCLEROSING  ADENOSIS, AND COARSE DYSTROPHIC  CALCIFICATIONS. - NEGATIVE FOR  ATYPICAL PROLIFERATIVE BREAST DISEASE.   Pathology results are CONCORDANT with imaging findings, per Dr.  Sande Brothers with consideration of excision.   Pathology results and recommendations below were discussed with  patient by telephone on 06/26/2022. Patient reported biopsy site  within normal limits with slight tenderness at the site. Post biopsy  care instructions were reviewed, questions were answered and my  direct phone number was provided to patient. Patient was instructed  to call [facility] if any concerns or questions arise  related to the biopsy.   RECOMMENDATION: Surgical consultation for possible excision for both  sites A and B. (Note: Site A is a small incidental papilloma, which  may be considered for excision if Site B undergoes surgical  removal). Request for surgical consultation relayed to Irving Shows RN  at [facility] by Randa Lynn RN on 06/26/2021.   Pathology results reported by Randa Lynn RN on 06/26/2022.   Electronically Signed    By: Sande Brothers M.D.    On: 06/27/2022 09:37 Narrative: CLINICAL DATA:  67 year old female with 2 groups of indeterminate left breast calcifications.  EXAM: LEFT BREAST STEREOTACTIC CORE NEEDLE BIOPSY  COMPARISON:  Previous exam(s).  FINDINGS: The patient and I discussed the procedure of stereotactic-guided biopsy including benefits and alternatives. We discussed the high likelihood of a successful procedure. We discussed the risks of the procedure including infection, bleeding, tissue injury, clip migration, and inadequate sampling. Informed written consent was given. The usual time out protocol was performed immediately prior to the procedure.  Lesion quadrant: Upper outer quadrant  Using sterile technique and 1% Lidocaine as local anesthetic, under stereotactic guidance, a 9 gauge vacuum assisted device was used to perform core needle biopsy of  calcifications in the upper outer left breast at posterior depth using a superior approach. Specimen radiograph was performed showing calcifications in several specimens. Specimens with calcifications are identified for pathology. At the conclusion of the procedure, a ribbon shaped tissue marker clip was deployed into the biopsy cavity.  Lesion quadrant: Upper inner quadrant  Using sterile technique and 1% Lidocaine as local anesthetic, under stereotactic guidance, a 9 gauge vacuum assisted device was used to perform core needle biopsy of calcifications in the upper inner left breast at mid depth using a superior approach. Specimen radiograph was performed showing calcifications in several specimens. Specimens with calcifications are identified for pathology. At the conclusion of the procedure, a coil shaped tissue marker clip was deployed into the biopsy cavity.  Follow-up 2-view mammogram was performed and dictated separately.  IMPRESSION: Stereotactic-guided biopsy of the left breast x2. No apparent complications.  Electronically Signed: By: Sande Brothers M.D. On: 06/25/2022 11:36 MM LT BREAST BX W LOC DEV 1ST LESION IMAGE BX SPEC STEREO GUIDE Addendum: ADDENDUM REPORT: 06/27/2022 09:37   ADDENDUM:  PATHOLOGY revealed: Site A. BREAST, LEFT UPPER OUTER QUADRANT,  POSTERIOR DEPTH; STEREOTACTIC CORE NEEDLE BIOPSY:- BENIGN MAMMARY  PARENCHYMA WITH FIBROCYSTIC AND FIBROADENOMATOID CHANGES, WITH  ASSOCIATED COARSE DYSTROPHIC CALCIFICATIONS. -  FOCAL COLUMNAR CELL  CHANGE WITHOUT ATYPIA, AND MINUTE INTRADUCTAL PAPILLOMA. - NEGATIVE  FOR ATYPICAL PROLIFERATIVE BREAST DISEASE.   Pathology results are CONCORDANT with imaging findings, per Dr.  Sande Brothers with consideration of excision.   PATHOLOGY revealed: Site B. BREAST, LEFT UPPER INNER QUADRANT,  MIDDLE DEPTH; STEREOTACTIC CORE NEEDLE BIOPSY: - BENIGN MAMMARY  PARENCHYMA DISPLAYING CHANGES COMPATIBLE WITH RADIAL  SCAR/COMPLEX  SCLEROSING LESION, WITH ASSOCIATED FIBROCYSTIC CHANGES, SCLEROSING  ADENOSIS, AND COARSE DYSTROPHIC CALCIFICATIONS. - NEGATIVE FOR  ATYPICAL PROLIFERATIVE BREAST DISEASE.   Pathology results are CONCORDANT with imaging findings, per Dr.  Sande Brothers with consideration of excision.   Pathology results and recommendations below were discussed with  patient by telephone on 06/26/2022. Patient reported biopsy site  within normal limits with slight tenderness at the site. Post biopsy  care instructions were reviewed, questions were answered and my  direct phone number was provided to patient. Patient was instructed  to call [facility] if any concerns or questions arise  related to the biopsy.   RECOMMENDATION: Surgical consultation for possible excision for both  sites A and B. (Note: Site A is a small incidental papilloma, which  may be considered for excision if Site B undergoes surgical  removal). Request for surgical consultation relayed to Irving Shows RN  at [facility] by Randa Lynn RN on 06/26/2021.   Pathology results reported by Randa Lynn RN on 06/26/2022.   Electronically Signed    By: Sande Brothers M.D.    On: 06/27/2022 09:37 Narrative: CLINICAL DATA:  67 year old female with 2 groups of indeterminate left breast calcifications.  EXAM: LEFT BREAST STEREOTACTIC CORE NEEDLE BIOPSY  COMPARISON:  Previous exam(s).  FINDINGS: The patient and I discussed the procedure of stereotactic-guided biopsy including benefits and alternatives. We discussed the high likelihood of a successful procedure. We discussed the risks of the procedure including infection, bleeding, tissue injury, clip migration, and inadequate sampling. Informed written consent was given. The usual time out protocol was performed immediately prior to the procedure.  Lesion quadrant: Upper outer quadrant  Using sterile technique and 1% Lidocaine as local anesthetic,  under stereotactic guidance, a 9 gauge vacuum assisted device was used to perform core needle biopsy of calcifications in the upper outer left breast at posterior depth using a superior approach. Specimen radiograph was performed showing calcifications in several specimens. Specimens with calcifications are identified for pathology. At the conclusion of the procedure, a ribbon shaped tissue marker clip was deployed into the biopsy cavity.  Lesion quadrant: Upper inner quadrant  Using sterile technique and 1% Lidocaine as local anesthetic, under stereotactic guidance, a 9 gauge vacuum assisted device was used to perform core needle biopsy of calcifications in the upper inner left breast at mid depth using a superior approach. Specimen radiograph was performed showing calcifications in several specimens. Specimens with calcifications are identified for pathology. At the conclusion of the procedure, a coil shaped tissue marker clip was deployed into the biopsy cavity.  Follow-up 2-view mammogram was performed and dictated separately.  IMPRESSION: Stereotactic-guided biopsy of the left breast x2. No apparent complications.  Electronically Signed: By: Sande Brothers M.D. On: 06/25/2022 11:36  Note: Reviewed        Physical Exam  General appearance: Well nourished, well developed, and well hydrated. In no apparent acute distress Mental status: Alert, oriented x 3 (person, place, & time)       Respiratory: No evidence of acute respiratory distress Eyes: PERLA Vitals: BP Marland Kitchen)  149/92 (BP Location: Right Arm, Patient Position: Sitting, Cuff Size: Normal)   Pulse 83   Temp 98 F (36.7 C) (Temporal)   Resp 16   Ht 5\' 2"  (1.575 m)   Wt 175 lb (79.4 kg)   SpO2 97%   BMI 32.01 kg/m  BMI: Estimated body mass index is 32.01 kg/m as calculated from the following:   Height as of this encounter: 5\' 2"  (1.575 m).   Weight as of this encounter: 175 lb (79.4 kg). Ideal: Ideal body weight:  50.1 kg (110 lb 7.2 oz) Adjusted ideal body weight: 61.8 kg (136 lb 4.3 oz)  Lumbar spine: Skin & Axial Inspection: No masses, redness, or swelling Alignment: Symmetrical Functional ROM: Unrestricted ROM       Stability: No instability detected Muscle Tone/Strength: Functionally intact. No obvious neuro-muscular anomalies detected. Sensory (Neurological): Musculoskeletal pain pattern  Gait & Posture Assessment  Ambulation: Unassisted Gait: Relatively normal for age and body habitus Posture: WNL    Lower Extremity Exam      Side: Right lower extremity   Side: Left lower extremity  Stability: No instability observed           Stability: No instability observed          Skin & Extremity Inspection: Skin color, temperature, and hair growth are WNL. No peripheral edema or cyanosis. No masses, redness, swelling, asymmetry, or associated skin lesions. No contractures.   Skin & Extremity Inspection: Skin color, temperature, and hair growth are WNL. No peripheral edema or cyanosis. No masses, redness, swelling, asymmetry, or associated skin lesions. No contractures.  Functional ROM: pain restricted           Functional ROM: Unrestricted ROM                  Muscle Tone/Strength: Functionally intact. No obvious neuro-muscular anomalies detected.   Muscle Tone/Strength: Functionally intact. No obvious neuro-muscular anomalies detected.  Sensory (Neurological): Musculoskeletal pain pattern, severe   Sensory (Neurological): Musculoskeletal pain pattern        DTR: Patellar: deferred today Achilles: deferred today Plantar: deferred today   DTR: Patellar: deferred today Achilles: deferred today Plantar: deferred today  Palpation: No palpable anomalies   Palpation: No palpable anomalies    Assessment   Status Diagnosis  Controlled Controlled Controlled 1. Chronic pain syndrome   2. 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 Lumbar degenerative disc disease   4. Pain  management contract signed           Plan of Care   Ms. NIEMAH DEARMAN has a current medication list which includes the following long-term medication(s): lisinopril, montelukast, rosuvastatin, and sulfasalazine.  1. Chronic pain syndrome - methadone (DOLOPHINE) 5 MG tablet; Take 1 tablet (5 mg total) by mouth every 8 (eight) hours. Must last 30 days.  Dispense: 90 tablet; Refill: 0 - methadone (DOLOPHINE) 5 MG tablet; Take 1 tablet (5 mg total) by mouth every 8 (eight) hours. Must last 30 days.  Dispense: 90 tablet; Refill: 0 - methadone (DOLOPHINE) 5 MG tablet; Take 1 tablet (5 mg total) by mouth every 8 (eight) hours. Must last 30 days.  Dispense: 90 tablet; Refill: 0  2. 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 Lumbar degenerative disc disease  4. Pain management contract signed    Updated EKG and urine toxicology, repeat next year   Pharmacotherapy (Medications Ordered): Meds ordered this encounter  Medications   methadone (DOLOPHINE) 5 MG  tablet    Sig: Take 1 tablet (5 mg total) by mouth every 8 (eight) hours. Must last 30 days.    Dispense:  90 tablet    Refill:  0    [place] STOP ACT - Not applicable. Fill one day early if pharmacy is closed on scheduled refill date.   methadone (DOLOPHINE) 5 MG tablet    Sig: Take 1 tablet (5 mg total) by mouth every 8 (eight) hours. Must last 30 days.    Dispense:  90 tablet    Refill:  0    [place] STOP ACT - Not applicable. Fill one day early if pharmacy is closed on scheduled refill date.   methadone (DOLOPHINE) 5 MG tablet    Sig: Take 1 tablet (5 mg total) by mouth every 8 (eight) hours. Must last 30 days.    Dispense:  90 tablet    Refill:  0    [place] STOP ACT - Not applicable. Fill one day early if pharmacy is closed on scheduled refill date.   methocarbamol (ROBAXIN) 500 MG tablet    Sig: Take 1 tablet (500 mg total) by mouth 2 (two) times daily as needed for muscle spasms.    Dispense:  60 tablet    Refill:  2    No orders of the defined types were placed in this encounter.    Follow-up plan:   Return in about 3 months (around 02/05/2023) for Medication Management, in person.    Recent Visits No visits were found meeting these conditions. Showing recent visits within past 90 days and meeting all other requirements Today's Visits Date Type Provider Dept  10/28/22 Office Visit Edward Jolly, MD [facility]  Showing today's visits and meeting all other requirements Future Appointments No visits were found meeting these conditions. Showing future appointments within next 90 days and meeting all other requirements  I discussed the assessment and treatment plan with the patient. The patient was provided an opportunity to ask questions and all were answered. The patient agreed with the plan and demonstrated an understanding of the instructions.  Patient advised to call back or seek an in-person evaluation if the symptoms or condition worsens.  Duration of encounter: 30 minutes.  Note by: Edward Jolly, MD Date: 10/28/2022; Time: 11:20 AM

## 2022-10-28 NOTE — Progress Notes (Signed)
Nursing Pain Medication Assessment:  Safety precautions to be maintained throughout the outpatient stay will include: orient to surroundings, keep bed in low position, maintain call bell within reach at all times, provide assistance with transfer out of bed and ambulation.  Medication Inspection Compliance: Pill count conducted under aseptic conditions, in front of the patient. Neither the pills nor the bottle was removed from the patient's sight at any time. Once count was completed pills were immediately returned to the patient in their original bottle.  Medication: Methadone Pill/Patch Count:  46.5 of 90 pills remain Pill/Patch Appearance: Markings consistent with prescribed medication Bottle Appearance: Standard pharmacy container. Clearly labeled. Filled Date: 04 / 19 / 2024 Last Medication intake:  Today

## 2022-11-05 ENCOUNTER — Ambulatory Visit
Admission: RE | Admit: 2022-11-05 | Discharge: 2022-11-05 | Disposition: A | Discharge status: Home / Self Care | Payer: Managed Care, Other (non HMO) | Source: Ambulatory Visit | Attending: Surgery | Admitting: Surgery

## 2022-11-05 DIAGNOSIS — N6489 Other specified disorders of breast: Secondary | ICD-10-CM | POA: Insufficient documentation

## 2022-11-05 DIAGNOSIS — R921 Mammographic calcification found on diagnostic imaging of breast: Secondary | ICD-10-CM | POA: Diagnosis present

## 2022-11-05 DIAGNOSIS — N6012 Diffuse cystic mastopathy of left breast: Secondary | ICD-10-CM

## 2022-11-05 HISTORY — PX: BREAST BIOPSY: SHX20

## 2022-11-05 MED ORDER — LIDOCAINE-EPINEPHRINE (PF) 1 %-1:200000 IJ SOLN
10.0000 mL | Freq: Once | INTRAMUSCULAR | Status: AC
  Administered 2022-11-05: 10 mL
  Filled 2022-11-05: qty 10

## 2022-11-05 MED ORDER — LIDOCAINE HCL (PF) 1 % IJ SOLN
20.0000 mL | Freq: Once | INTRAMUSCULAR | Status: AC
  Administered 2022-11-05: 20 mL
  Filled 2022-11-05: qty 20

## 2022-11-18 ENCOUNTER — Encounter
Admission: RE | Admit: 2022-11-18 | Discharge: 2022-11-18 | Disposition: A | Discharge status: Home / Self Care | Payer: Managed Care, Other (non HMO) | Source: Ambulatory Visit | Attending: Surgery | Admitting: Surgery

## 2022-11-18 ENCOUNTER — Other Ambulatory Visit: Payer: Self-pay

## 2022-11-18 DIAGNOSIS — I10 Essential (primary) hypertension: Secondary | ICD-10-CM

## 2022-11-18 HISTORY — DX: Personal history of urinary calculi: Z87.442

## 2022-11-18 HISTORY — DX: Other complications of anesthesia, initial encounter: T88.59XA

## 2022-11-18 HISTORY — DX: Pneumonia, unspecified organism: J18.9

## 2022-11-18 NOTE — Patient Instructions (Addendum)
Your procedure is scheduled on: 11/26/22 -  Wednesday Report to the Registration Desk on the 1st floor of the [facility]. To find out your arrival time, please call [phone] between 1PM - 3PM on: 11/25/22 - Tuesday If your arrival time is 6:00 am, do not arrive before that time as the [facility] entrance doors do not open until 6:00 am.  REMEMBER: Instructions that are not followed completely may result in serious medical risk, up to and including death; or upon the discretion of your surgeon and anesthesiologist your surgery may need to be rescheduled.  Do not eat food or drink any liquids after midnight the night before surgery.  No gum chewing or hard candies.  One week prior to surgery: Stop Anti-inflammatories (NSAIDS) such as Advil, Aleve, Ibuprofen, Motrin, Naproxen, Naprosyn and Aspirin based products such as Excedrin, Goody's Powder, BC Powder.  Stop ANY OVER THE COUNTER supplements until after surgery.  You may take Tylenol if needed for pain up until the day of surgery.  TAKE ONLY THESE MEDICATIONS THE MORNING OF SURGERY WITH A SIP OF WATER:  busPIRone (BUSPAR)  hydroxychloroquine (PLAQUENIL)  leflunomide (ARAVA)  methadone (DOLOPHINE)  rosuvastatin (CRESTOR)  sulfaSALAzine (AZULFIDINE)    No Alcohol for 24 hours before or after surgery.  No Smoking including e-cigarettes for 24 hours before surgery.  No chewable tobacco products for at least 6 hours before surgery.  No nicotine patches on the day of surgery.  Do not use any "recreational" drugs for at least a week (preferably 2 weeks) before your surgery.  Please be advised that the combination of cocaine and anesthesia may have negative outcomes, up to and including death. If you test positive for cocaine, your surgery will be cancelled.  On the morning of surgery brush your teeth with toothpaste and water, you may rinse your mouth with mouthwash if you wish.  Do not swallow any toothpaste or  mouthwash.  Use CHG Soap or wipes as directed on instruction sheet.  Do not wear jewelry, make-up, hairpins, clips or nail polish.  Do not wear lotions, powders, or perfumes.   Do not shave body hair from the neck down 48 hours before surgery.  Contact lenses, hearing aids and dentures may not be worn into surgery.  Do not bring valuables to the hospital. [facility] is not responsible for any missing/lost belongings or valuables.   Notify your doctor if there is any change in your medical condition (cold, fever, infection).  Wear comfortable clothing (specific to your surgery type) to the hospital.  After surgery, you can help prevent lung complications by doing breathing exercises.  Take deep breaths and cough every 1-2 hours. Your doctor may order a device called an Incentive Spirometer to help you take deep breaths. When coughing or sneezing, hold a pillow firmly against your incision with both hands. This is called "splinting." Doing this helps protect your incision. It also decreases belly discomfort.  If you are being admitted to the hospital overnight, leave your suitcase in the car. After surgery it may be brought to your room.  In case of increased patient census, it may be necessary for you, the patient, to continue your postoperative care in the Same Day Surgery department.  If you are being discharged the day of surgery, you will not be allowed to drive home. You will need a responsible individual to drive you home and stay with you for 24 hours after surgery.   If you are taking public transportation,  you will need to have a responsible individual with you.  Please call the Pre-admissions Testing Dept. at [phone] if you have any questions about these instructions.  Surgery Visitation Policy:  Patients having surgery or a procedure may have two visitors.  Children under the age of 78 must have an adult with them who is not the patient.  Inpatient Visitation:     Visiting hours are 7 a.m. to 8 p.m. Up to four visitors are allowed at one time in a patient room. The visitors may rotate out with other people during the day.  One visitor age 41 or older may stay with the patient overnight and must be in the room by 8 p.m.    Preparing for Surgery with CHLORHEXIDINE GLUCONATE (CHG) Soap  Chlorhexidine Gluconate (CHG) Soap  o An antiseptic cleaner that kills germs and bonds with the skin to continue killing germs even after washing  o Used for showering the night before surgery and morning of surgery  Before surgery, you can play an important role by reducing the number of germs on your skin.  CHG (Chlorhexidine gluconate) soap is an antiseptic cleanser which kills germs and bonds with the skin to continue killing germs even after washing.  Please do not use if you have an allergy to CHG or antibacterial soaps. If your skin becomes reddened/irritated stop using the CHG.  1. Shower the NIGHT BEFORE SURGERY and the MORNING OF SURGERY with CHG soap.  2. If you choose to wash your hair, wash your hair first as usual with your normal shampoo.  3. After shampooing, rinse your hair and body thoroughly to remove the shampoo.  4. Use CHG as you would any other liquid soap. You can apply CHG directly to the skin and wash gently with a scrungie or a clean washcloth.  5. Apply the CHG soap to your body only from the neck down. Do not use on open wounds or open sores. Avoid contact with your eyes, ears, mouth, and genitals (private parts). Wash face and genitals (private parts) with your normal soap.  6. Wash thoroughly, paying special attention to the area where your surgery will be performed.  7. Thoroughly rinse your body with warm water.  8. Do not shower/wash with your normal soap after using and rinsing off the CHG soap.  9. Pat yourself dry with a clean towel.  10. Wear clean pajamas to bed the night before surgery.  12. Place clean sheets on  your bed the night of your first shower and do not sleep with pets.  13. Shower again with the CHG soap on the day of surgery prior to arriving at the hospital.  14. Do not apply any deodorants/lotions/powders.  15. Please wear clean clothes to the hospital.

## 2022-11-19 ENCOUNTER — Encounter
Admission: RE | Admit: 2022-11-19 | Discharge: 2022-11-19 | Disposition: A | Discharge status: Home / Self Care | Payer: Managed Care, Other (non HMO) | Source: Ambulatory Visit | Attending: Surgery | Admitting: Surgery

## 2022-11-19 DIAGNOSIS — Z01818 Encounter for other preprocedural examination: Secondary | ICD-10-CM | POA: Diagnosis present

## 2022-11-19 DIAGNOSIS — I10 Essential (primary) hypertension: Secondary | ICD-10-CM | POA: Diagnosis not present

## 2022-11-19 DIAGNOSIS — R928 Other abnormal and inconclusive findings on diagnostic imaging of breast: Secondary | ICD-10-CM | POA: Diagnosis not present

## 2022-11-19 DIAGNOSIS — N6489 Other specified disorders of breast: Secondary | ICD-10-CM | POA: Insufficient documentation

## 2022-11-19 LAB — COMPREHENSIVE METABOLIC PANEL
ALT: 38 U/L (ref 0–44)
AST: 40 U/L (ref 15–41)
Albumin: 4.7 g/dL (ref 3.5–5.0)
Alkaline Phosphatase: 63 U/L (ref 38–126)
Anion gap: 9 (ref 5–15)
BUN: 21 mg/dL (ref 8–23)
CO2: 26 mmol/L (ref 22–32)
Calcium: 9.4 mg/dL (ref 8.9–10.3)
Chloride: 105 mmol/L (ref 98–111)
Creatinine, Ser: 1.11 mg/dL — ABNORMAL HIGH (ref 0.44–1.00)
GFR, Estimated: 55 mL/min — ABNORMAL LOW (ref >60)
Glucose, Bld: 107 mg/dL — ABNORMAL HIGH (ref 70–99)
Potassium: 4.1 mmol/L (ref 3.5–5.1)
Sodium: 140 mmol/L (ref 135–145)
Total Bilirubin: 1.3 mg/dL — ABNORMAL HIGH (ref 0.3–1.2)
Total Protein: 6.2 g/dL — ABNORMAL LOW (ref 6.5–8.1)

## 2022-11-19 LAB — CBC WITH DIFFERENTIAL/PLATELET
Abs Immature Granulocytes: 0.01 10*3/uL (ref 0.00–0.07)
Basophils Absolute: 0 10*3/uL (ref 0.0–0.1)
Basophils Relative: 1 %
Eosinophils Absolute: 0.1 10*3/uL (ref 0.0–0.5)
Eosinophils Relative: 2 %
HCT: 38.6 % (ref 36.0–46.0)
Hemoglobin: 12.7 g/dL (ref 12.0–15.0)
Immature Granulocytes: 0 %
Lymphocytes Relative: 23 %
Lymphs Abs: 1 10*3/uL (ref 0.7–4.0)
MCH: 29.7 pg (ref 26.0–34.0)
MCHC: 32.9 g/dL (ref 30.0–36.0)
MCV: 90.4 fL (ref 80.0–100.0)
Monocytes Absolute: 0.3 10*3/uL (ref 0.1–1.0)
Monocytes Relative: 7 %
Neutro Abs: 3 10*3/uL (ref 1.7–7.7)
Neutrophils Relative %: 67 %
Platelets: 193 10*3/uL (ref 150–400)
RBC: 4.27 MIL/uL (ref 3.87–5.11)
RDW: 13.7 % (ref 11.5–15.5)
WBC: 4.4 10*3/uL (ref 4.0–10.5)
nRBC: 0 % (ref 0.0–0.2)

## 2022-11-25 MED ORDER — ORAL CARE MOUTH RINSE: 15.0000 mL | Freq: Once | OROMUCOSAL | Status: AC

## 2022-11-25 MED ORDER — CHLORHEXIDINE GLUCONATE 0.12 % MT SOLN
15.0000 mL | Freq: Once | OROMUCOSAL | Status: AC
  Administered 2022-11-26: 15 mL via OROMUCOSAL

## 2022-11-25 MED ORDER — BUPIVACAINE LIPOSOME 1.3 % IJ SUSP: 20.0000 mL | Freq: Once | INTRAMUSCULAR | Status: DC

## 2022-11-25 MED ORDER — CELECOXIB 200 MG PO CAPS
200.0000 mg | ORAL_CAPSULE | ORAL | Status: AC
  Administered 2022-11-26: 200 mg via ORAL

## 2022-11-25 MED ORDER — GABAPENTIN 300 MG PO CAPS
300.0000 mg | ORAL_CAPSULE | ORAL | Status: AC
  Administered 2022-11-26: 300 mg via ORAL

## 2022-11-25 MED ORDER — CEFAZOLIN SODIUM-DEXTROSE 2-4 GM/100ML-% IV SOLN
2.0000 g | INTRAVENOUS | Status: AC
  Administered 2022-11-26: 2 g via INTRAVENOUS

## 2022-11-25 MED ORDER — LACTATED RINGERS IV SOLN: INTRAVENOUS | Status: DC

## 2022-11-25 MED ORDER — ACETAMINOPHEN 500 MG PO TABS
1000.0000 mg | ORAL_TABLET | ORAL | Status: AC
  Administered 2022-11-26: 1000 mg via ORAL

## 2022-11-25 MED ORDER — FAMOTIDINE 20 MG PO TABS
20.0000 mg | ORAL_TABLET | Freq: Once | ORAL | Status: AC
  Administered 2022-11-26: 20 mg via ORAL

## 2022-11-25 MED ORDER — CHLORHEXIDINE GLUCONATE CLOTH 2 % EX PADS
6.0000 | MEDICATED_PAD | Freq: Once | CUTANEOUS | Status: AC
  Administered 2022-11-26: 6 via TOPICAL

## 2022-11-26 ENCOUNTER — Other Ambulatory Visit: Payer: Self-pay

## 2022-11-26 ENCOUNTER — Ambulatory Visit
Admission: RE | Admit: 2022-11-26 | Discharge: 2022-11-26 | Disposition: A | Discharge status: Home / Self Care | Payer: Managed Care, Other (non HMO) | Attending: Surgery | Admitting: Surgery

## 2022-11-26 ENCOUNTER — Ambulatory Visit
Admission: RE | Admit: 2022-11-26 | Discharge: 2022-11-26 | Disposition: A | Discharge status: Home / Self Care | Payer: Managed Care, Other (non HMO) | Source: Ambulatory Visit | Attending: Surgery | Admitting: Surgery

## 2022-11-26 ENCOUNTER — Encounter
Admission: RE | Disposition: A | Discharge status: Home / Self Care | Payer: Self-pay | Source: Home / Self Care | Attending: Surgery

## 2022-11-26 ENCOUNTER — Ambulatory Visit: Payer: Managed Care, Other (non HMO) | Admitting: Urgent Care

## 2022-11-26 ENCOUNTER — Ambulatory Visit: Payer: Managed Care, Other (non HMO)

## 2022-11-26 ENCOUNTER — Encounter: Payer: Self-pay | Admitting: Surgery

## 2022-11-26 DIAGNOSIS — I10 Essential (primary) hypertension: Secondary | ICD-10-CM | POA: Diagnosis not present

## 2022-11-26 DIAGNOSIS — F419 Anxiety disorder, unspecified: Secondary | ICD-10-CM | POA: Insufficient documentation

## 2022-11-26 DIAGNOSIS — R928 Other abnormal and inconclusive findings on diagnostic imaging of breast: Secondary | ICD-10-CM

## 2022-11-26 DIAGNOSIS — F17210 Nicotine dependence, cigarettes, uncomplicated: Secondary | ICD-10-CM | POA: Diagnosis not present

## 2022-11-26 DIAGNOSIS — Z90710 Acquired absence of both cervix and uterus: Secondary | ICD-10-CM | POA: Insufficient documentation

## 2022-11-26 DIAGNOSIS — N6489 Other specified disorders of breast: Secondary | ICD-10-CM | POA: Diagnosis present

## 2022-11-26 DIAGNOSIS — D242 Benign neoplasm of left breast: Secondary | ICD-10-CM

## 2022-11-26 HISTORY — PX: BREAST BIOPSY WITH RADIO FREQUENCY LOCALIZER: SHX6895

## 2022-11-26 SURGERY — BREAST BIOPSY WITH RADIO FREQUENCY LOCALIZER
Anesthesia: General | Laterality: Left

## 2022-11-26 MED ORDER — BUPIVACAINE LIPOSOME 1.3 % IJ SUSP
INTRAMUSCULAR | Status: AC
  Filled 2022-11-26: qty 20

## 2022-11-26 MED ORDER — MIDAZOLAM HCL 2 MG/2ML IJ SOLN
INTRAMUSCULAR | Status: AC
  Filled 2022-11-26: qty 2

## 2022-11-26 MED ORDER — DIPHENHYDRAMINE HCL 50 MG/ML IJ SOLN
INTRAMUSCULAR | Status: DC | PRN
  Administered 2022-11-26: 6.25 mg via INTRAVENOUS

## 2022-11-26 MED ORDER — EPINEPHRINE PF 1 MG/ML IJ SOLN
INTRAMUSCULAR | Status: AC
  Filled 2022-11-26: qty 1

## 2022-11-26 MED ORDER — SEVOFLURANE IN SOLN
RESPIRATORY_TRACT | Status: AC
  Filled 2022-11-26: qty 250

## 2022-11-26 MED ORDER — ONDANSETRON HCL 4 MG/2ML IJ SOLN
INTRAMUSCULAR | Status: DC | PRN
  Administered 2022-11-26: 4 mg via INTRAVENOUS

## 2022-11-26 MED ORDER — STERILE WATER FOR IRRIGATION IR SOLN
Status: DC | PRN
  Administered 2022-11-26: 200 mL

## 2022-11-26 MED ORDER — BUPIVACAINE HCL (PF) 0.25 % IJ SOLN
INTRAMUSCULAR | Status: AC
  Filled 2022-11-26: qty 30

## 2022-11-26 MED ORDER — EPHEDRINE 5 MG/ML INJ
INTRAVENOUS | Status: AC
  Filled 2022-11-26: qty 5

## 2022-11-26 MED ORDER — FENTANYL CITRATE (PF) 100 MCG/2ML IJ SOLN
INTRAMUSCULAR | Status: DC | PRN
  Administered 2022-11-26 (×4): 25 ug via INTRAVENOUS

## 2022-11-26 MED ORDER — FAMOTIDINE 20 MG PO TABS
ORAL_TABLET | ORAL | Status: AC
  Filled 2022-11-26: qty 1

## 2022-11-26 MED ORDER — DROPERIDOL 2.5 MG/ML IJ SOLN: 0.6250 mg | Freq: Once | INTRAMUSCULAR | Status: DC | PRN

## 2022-11-26 MED ORDER — LACTATED RINGERS IV SOLN: INTRAVENOUS | Status: DC | PRN

## 2022-11-26 MED ORDER — FENTANYL CITRATE (PF) 100 MCG/2ML IJ SOLN
INTRAMUSCULAR | Status: AC
  Filled 2022-11-26: qty 2

## 2022-11-26 MED ORDER — PROMETHAZINE HCL 25 MG/ML IJ SOLN: 6.2500 mg | INTRAMUSCULAR | Status: DC | PRN

## 2022-11-26 MED ORDER — OXYCODONE HCL 5 MG PO TABS
5.0000 mg | ORAL_TABLET | Freq: Once | ORAL | Status: AC | PRN
  Administered 2022-11-26: 5 mg via ORAL

## 2022-11-26 MED ORDER — HYDROCODONE-ACETAMINOPHEN 5-325 MG PO TABS
1.0000 | ORAL_TABLET | Freq: Four times a day (QID) | ORAL | 0 refills | Status: DC | PRN
Start: 2022-11-26 — End: 2022-12-09

## 2022-11-26 MED ORDER — OXYCODONE HCL 5 MG PO TABS
ORAL_TABLET | ORAL | Status: AC
  Filled 2022-11-26: qty 1

## 2022-11-26 MED ORDER — LIDOCAINE HCL (CARDIAC) PF 100 MG/5ML IV SOSY
PREFILLED_SYRINGE | INTRAVENOUS | Status: DC | PRN
  Administered 2022-11-26: 60 mg via INTRAVENOUS

## 2022-11-26 MED ORDER — FENTANYL CITRATE (PF) 100 MCG/2ML IJ SOLN: 25.0000 ug | INTRAMUSCULAR | Status: DC | PRN

## 2022-11-26 MED ORDER — PROPOFOL 10 MG/ML IV BOLUS
INTRAVENOUS | Status: DC | PRN
  Administered 2022-11-26: 30 mg via INTRAVENOUS
  Administered 2022-11-26: 150 mg via INTRAVENOUS

## 2022-11-26 MED ORDER — CEFAZOLIN SODIUM-DEXTROSE 2-4 GM/100ML-% IV SOLN
INTRAVENOUS | Status: AC
  Filled 2022-11-26: qty 100

## 2022-11-26 MED ORDER — CELECOXIB 200 MG PO CAPS
ORAL_CAPSULE | ORAL | Status: AC
  Filled 2022-11-26: qty 1

## 2022-11-26 MED ORDER — PHENYLEPHRINE 80 MCG/ML (10ML) SYRINGE FOR IV PUSH (FOR BLOOD PRESSURE SUPPORT)
PREFILLED_SYRINGE | INTRAVENOUS | Status: AC
  Filled 2022-11-26: qty 10

## 2022-11-26 MED ORDER — OXYCODONE HCL 5 MG/5ML PO SOLN: 5.0000 mg | Freq: Once | ORAL | Status: AC | PRN

## 2022-11-26 MED ORDER — ACETAMINOPHEN 500 MG PO TABS
ORAL_TABLET | ORAL | Status: AC
  Filled 2022-11-26: qty 2

## 2022-11-26 MED ORDER — PROPOFOL 10 MG/ML IV BOLUS
INTRAVENOUS | Status: AC
  Filled 2022-11-26: qty 20

## 2022-11-26 MED ORDER — BUPIVACAINE LIPOSOME 1.3 % IJ SUSP
INTRAMUSCULAR | Status: DC | PRN
  Administered 2022-11-26: 20 mL

## 2022-11-26 MED ORDER — PHENYLEPHRINE HCL (PRESSORS) 10 MG/ML IV SOLN
INTRAVENOUS | Status: DC | PRN
  Administered 2022-11-26: 80 ug via INTRAVENOUS
  Administered 2022-11-26: 160 ug via INTRAVENOUS
  Administered 2022-11-26: 80 ug via INTRAVENOUS
  Administered 2022-11-26: 60 ug via INTRAVENOUS
  Administered 2022-11-26 (×2): 160 ug via INTRAVENOUS

## 2022-11-26 MED ORDER — LIDOCAINE HCL (PF) 2 % IJ SOLN
INTRAMUSCULAR | Status: AC
  Filled 2022-11-26: qty 5

## 2022-11-26 MED ORDER — ACETAMINOPHEN 10 MG/ML IV SOLN: 1000.0000 mg | Freq: Once | INTRAVENOUS | Status: DC | PRN

## 2022-11-26 MED ORDER — DEXAMETHASONE SODIUM PHOSPHATE 10 MG/ML IJ SOLN
INTRAMUSCULAR | Status: DC | PRN
  Administered 2022-11-26: 10 mg via INTRAVENOUS

## 2022-11-26 MED ORDER — MIDAZOLAM HCL 2 MG/2ML IJ SOLN
INTRAMUSCULAR | Status: DC | PRN
  Administered 2022-11-26: 2 mg via INTRAVENOUS

## 2022-11-26 MED ORDER — BUPIVACAINE-EPINEPHRINE (PF) 0.25% -1:200000 IJ SOLN
INTRAMUSCULAR | Status: DC | PRN
  Administered 2022-11-26: 30 mL via PERINEURAL

## 2022-11-26 MED ORDER — CHLORHEXIDINE GLUCONATE 0.12 % MT SOLN
OROMUCOSAL | Status: AC
  Filled 2022-11-26: qty 15

## 2022-11-26 MED ORDER — EPHEDRINE SULFATE (PRESSORS) 50 MG/ML IJ SOLN
INTRAMUSCULAR | Status: DC | PRN
  Administered 2022-11-26 (×3): 5 mg via INTRAVENOUS
  Administered 2022-11-26: 10 mg via INTRAVENOUS

## 2022-11-26 MED ORDER — GABAPENTIN 300 MG PO CAPS
ORAL_CAPSULE | ORAL | Status: AC
  Filled 2022-11-26: qty 1

## 2022-11-26 SURGICAL SUPPLY — 42 items
ADH SKN CLS APL DERMABOND .7 (GAUZE/BANDAGES/DRESSINGS) ×1
APL PRP STRL LF DISP 70% ISPRP (MISCELLANEOUS) ×1
APPLIER CLIP 9.375 SM OPEN (CLIP)
APR CLP SM 9.3 20 MLT OPN (CLIP)
BINDER BREAST XLRG (GAUZE/BANDAGES/DRESSINGS) IMPLANT
BLADE SURG 15 STRL LF DISP TIS (BLADE) ×1 IMPLANT
BLADE SURG 15 STRL SS (BLADE) ×1
CHLORAPREP W/TINT 26 (MISCELLANEOUS) ×1 IMPLANT
CLIP APPLIE 9.375 SM OPEN (CLIP) IMPLANT
CNTNR URN SCR LID CUP LEK RST (MISCELLANEOUS) IMPLANT
CONT SPEC 4OZ STRL OR WHT (MISCELLANEOUS) ×1
DERMABOND ADVANCED .7 DNX12 (GAUZE/BANDAGES/DRESSINGS) ×1 IMPLANT
DEVICE DUBIN SPECIMEN MAMMOGRA (MISCELLANEOUS) ×1 IMPLANT
DRAPE LAPAROTOMY TRNSV 106X77 (MISCELLANEOUS) ×1 IMPLANT
ELECT CAUTERY BLADE TIP 2.5 (TIP) ×1
ELECT REM PT RETURN 9FT ADLT (ELECTROSURGICAL) ×1
ELECTRODE CAUTERY BLDE TIP 2.5 (TIP) ×1 IMPLANT
ELECTRODE REM PT RTRN 9FT ADLT (ELECTROSURGICAL) ×1 IMPLANT
GAUZE 4X4 16PLY ~~LOC~~+RFID DBL (SPONGE) ×1 IMPLANT
GLOVE ORTHO TXT STRL SZ7.5 (GLOVE) ×1 IMPLANT
GOWN STRL REUS W/ TWL LRG LVL3 (GOWN DISPOSABLE) ×1 IMPLANT
GOWN STRL REUS W/ TWL XL LVL3 (GOWN DISPOSABLE) ×1 IMPLANT
GOWN STRL REUS W/TWL LRG LVL3 (GOWN DISPOSABLE) ×1
GOWN STRL REUS W/TWL XL LVL3 (GOWN DISPOSABLE) ×1
KIT MARKER MARGIN INK (KITS) IMPLANT
KIT TURNOVER KIT A (KITS) ×1 IMPLANT
MANIFOLD NEPTUNE II (INSTRUMENTS) ×1 IMPLANT
NDL HYPO 22X1.5 SAFETY MO (MISCELLANEOUS) ×1 IMPLANT
NEEDLE HYPO 22X1.5 SAFETY MO (MISCELLANEOUS) ×1 IMPLANT
PACK BASIN MINOR ARMC (MISCELLANEOUS) ×1 IMPLANT
SET LOCALIZER 20 PROBE US (MISCELLANEOUS) ×1 IMPLANT
SUT MNCRL 4-0 (SUTURE) ×1
SUT MNCRL 4-0 27XMFL (SUTURE) ×1
SUT SILK 2 0 SH (SUTURE) IMPLANT
SUT VIC AB 3-0 SH 27 (SUTURE) ×1
SUT VIC AB 3-0 SH 27X BRD (SUTURE) ×1 IMPLANT
SUTURE MNCRL 4-0 27XMF (SUTURE) ×1 IMPLANT
SYR 20ML LL LF (SYRINGE) ×1 IMPLANT
TRAP FLUID SMOKE EVACUATOR (MISCELLANEOUS) ×1 IMPLANT
TRAP NEPTUNE SPECIMEN COLLECT (MISCELLANEOUS) ×1 IMPLANT
WATER STERILE IRR 1000ML POUR (IV SOLUTION) ×1 IMPLANT
WATER STERILE IRR 500ML POUR (IV SOLUTION) ×1 IMPLANT

## 2022-11-26 NOTE — Anesthesia Procedure Notes (Cosign Needed)
Procedure Name: LMA Insertion Date/Time: 11/26/2022 8:41 AM  Performed by: Foye Deer, MDPre-anesthesia Checklist: Patient identified, Emergency Drugs available, Suction available and Patient being monitored Patient Re-evaluated:Patient Re-evaluated prior to induction Oxygen Delivery Method: Circle system utilized Preoxygenation: Pre-oxygenation with 100% oxygen Induction Type: IV induction Ventilation: Mask ventilation without difficulty LMA: LMA inserted LMA Size: 4.0 Number of attempts: 1 Placement Confirmation: positive ETCO2 and breath sounds checked- equal and bilateral Tube secured with: Tape Dental Injury: Teeth and Oropharynx as per pre-operative assessment

## 2022-11-26 NOTE — Op Note (Signed)
  Pre-operative Diagnosis: Complex sclerosing lesion, and papilloma left breast   Post-operative Diagnosis: Same  Surgeon: Campbell Lerner, M.D., FACS  Anesthesia: Gen LMA  Procedure: Excisional left breast biopsies, RFID tag directed X 2.    Procedure Details  The patient was seen again in the Holding Room. The benefits, complications, treatment options, and expected outcomes were discussed with the patient. The risks of bleeding, infection, recurrence of symptoms, failure to resolve symptoms, hematoma, seroma, open wound, cosmetic deformity, and the need for further surgery were discussed.  The patient was taken to Operating Room, identified as Natasha Gonzales and the procedure verified.  A Time Out was held and the above information confirmed.  Prior to the induction of general anesthesia, antibiotic prophylaxis was administered. VTE prophylaxis was in place. The patient was positioned in the supine position. Appropriate anesthesia was then administered and tolerated well. The LOCALizer is used to mark the skin sites for incisions.   Attention was turned to the lateral left breast RFID tag localization site where an incision was made. Dissection using the LOCALizer to perform a lumpectomy with adequate margins was performed. This was done with electrocautery and sharp dissection with Mayo scissors. There was minimal bleeding, and the cavity packed.  Faxitron showed the markers to be centrally within the specimen, pathology reports a 5 mm margin is the shortest.    I returned to the cavity to remove the packing, and hemostasis was confirmed with electrocautery.  This wound was left packed.  Attention was turned to the medial left breast RFID tag localization site, where a transverse incision is made along the skin lines. Dissection using the LOCALizer to perform a lumpectomy with adequate margins was performed. This was done with electrocautery and sharp dissection with Mayo scissors. There was  minimal bleeding, and the cavity packed.  Faxitron showed the markers to be centrally within the specimen, pathology reports a 12 mm margin as the shortest.  Hemostasis was confirmed, both wounds having been irrigated with normal saline.  And again evaluated for adequate hemostasis with electrocautery.  We then closed each wound after infiltrating the deeper tissues with a combination of quarter percent Marcaine with Exparel. The biopsy cavities were closed with interrupted 3-0 Vicryl followed by 4-0 subcuticular Monocryl sutures. Dermabond is utilized to seal the incision.  I then instilled the remaining local anesthetic into the biopsy cavities.   Findings: Faxitron imaging: Markers centrally within the specimen.  Estimated Blood Loss: Minimal         Drains: None         Specimens: Left lateral breast tissue, left medial breast tissue, with marker [id number].       Complications: None         Condition: Stable  Campbell Lerner, M.D., [facility]  11/26/2022 ; 10:05 AM

## 2022-11-26 NOTE — Anesthesia Preprocedure Evaluation (Addendum)
Anesthesia Evaluation  Patient identified by MRN, date of birth, ID band Patient awake    Reviewed: Allergy & Precautions, H&P , NPO status , Patient's Chart, lab work & pertinent test results  History of Anesthesia Complications (+) history of anesthetic complications (has tremors x 2 hours after awakening)  Airway Mallampati: II  TM Distance: >3 FB Neck ROM: full    Dental no notable dental hx.    Pulmonary Current Smoker   Pulmonary exam normal        Cardiovascular hypertension, Normal cardiovascular exam     Neuro/Psych  PSYCHIATRIC DISORDERS Anxiety Depression    Chronic pain syndromenegative neurological ROS     GI/Hepatic negative GI ROS, Neg liver ROS,,,  Endo/Other  negative endocrine ROS    Renal/GU Renal InsufficiencyRenal disease     Musculoskeletal  (+) Arthritis ,    Abdominal   Peds  Hematology negative hematology ROS (+)   Anesthesia Other Findings Past Medical History: No date: Anxiety No date: Complication of anesthesia     Comment:  has tremors x 2 hours after awakening No date: Depression No date: Generalized headaches No date: History of kidney stones No date: Hyperlipidemia No date: Hypertension No date: Ovarian cyst No date: Pneumonia No date: Rheumatoid arthritis [facility])  Past Surgical History: 1993: ABDOMINAL HYSTERECTOMY     Comment:  total 2001: adhesion     Comment:  laprascopic 06/25/2022: BREAST BIOPSY; Left     Comment:  UOQ CALCS, STEREO BX, PATH PENIDNG 06/25/2022: BREAST BIOPSY; Left     Comment:  UIQ CALCS, STEREO BX, PATH PENDING 06/25/2022: BREAST BIOPSY; Left     Comment:  MM LT BREAST BX W LOC DEV 1ST LESION IMAGE BX SPEC               STEREO GUIDE 06/25/2022 [facility] 06/25/2022: BREAST BIOPSY; Left     Comment:  MM LT BREAST BX W LOC DEV EA AD LESION IMG BX SPEC               STEREO GUIDE 06/25/2022 [facility] 11/05/2022: BREAST BIOPSY     Comment:   MM LT RADIO FREQUENCY TAG EA ADD LESION LOC MAMMO GUIDE               11/05/2022 [facility] No date: COLONOSCOPY W/ POLYPECTOMY No date: KNEE ARTHROSCOPY; Right No date: OVARIAN CYST REMOVAL 1984: TONSILECTOMY/ADENOIDECTOMY WITH MYRINGOTOMY     Reproductive/Obstetrics negative OB ROS                             Anesthesia Physical Anesthesia Plan  ASA: 2  Anesthesia Plan: General LMA   Post-op Pain Management: Regional block*, Tylenol PO (pre-op)*, Gabapentin PO (pre-op)* and Celebrex PO (pre-op)*   Induction:   PONV Risk Score and Plan: 3 and Dexamethasone, Ondansetron and Midazolam  Airway Management Planned: LMA  Additional Equipment:   Intra-op Plan:   Post-operative Plan:   Informed Consent:      Dental Advisory Given  Plan Discussed with: Anesthesiologist, CRNA and Surgeon  Anesthesia Plan Comments:         Anesthesia Quick Evaluation

## 2022-11-26 NOTE — Discharge Instructions (Signed)
AMBULATORY SURGERY  DISCHARGE INSTRUCTIONS   The drugs that you were given will stay in your system until tomorrow so for the next 24 hours you should not:  Drive an automobile Make any legal decisions Drink any alcoholic beverage   You may resume regular meals tomorrow.  Today it is better to start with liquids and gradually work up to solid foods.  You may eat anything you prefer, but it is better to start with liquids, then soup and crackers, and gradually work up to solid foods.   Please notify your doctor immediately if you have any unusual bleeding, trouble breathing, redness and pain at the surgery site, drainage, fever, or pain not relieved by medication.   Additional Instructions:  leave green armband on for 4 days

## 2022-11-26 NOTE — H&P (Signed)
Chief Complaint: Left breast radial scar, papilloma, complex sclerosing lesion.   History of Present Illness   Natasha Gonzales is a 67 y.o. female is over 3 months after the biopsy results as reported below, of 2 separate areas of this patient's left breast.  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  The upper inner quadrant biopsy is consistent with radial scar and complex sclerosing lesion.   During much of our discussion we discussed removing both areas, but in retrospect only the upper inner is actually of significant risk. She has been pregnant 5 times with 3 children.  She has no family history of breast cancer.  She underwent a hysterectomy in her mid 30s and had hormonal therapy postop.  She had used birth control preop.  She began menstruating at the age of 22 and had her first pregnancy at the age of 89.  She breast-fed.  She denies any history of palpable mass, nipple discharge, skin changes or breast pain.     Past Medical History     Past Medical History:  Diagnosis Date   Anxiety     Depression     Generalized headaches     Hyperlipidemia     Hypertension     Ovarian cyst     Rheumatoid arthritis               Past Surgical History:  Procedure Laterality Date   ABDOMINAL HYSTERECTOMY   1993    total   adhesion   2001    laprascopic   BREAST BIOPSY Left 06/25/2022    UOQ CALCS, STEREO BX, PATH PENIDNG   BREAST BIOPSY Left 06/25/2022    UIQ CALCS, STEREO BX, PATH PENDING   BREAST BIOPSY Left 06/25/2022    MM LT BREAST BX W LOC DEV 1ST LESION IMAGE BX SPEC STEREO GUIDE 06/25/2022 [facility]   BREAST BIOPSY Left 06/25/2022    MM LT BREAST BX W LOC DEV EA AD LESION IMG BX SPEC STEREO GUIDE 06/25/2022 [facility]   JOINT REPLACEMENT       OVARIAN CYST REMOVAL       TONSILECTOMY/ADENOIDECTOMY WITH MYRINGOTOMY   1984           Allergies  Allergen Reactions   Erythromycin Base        Other reaction(s): Gastric reaaction             Current Outpatient Medications  Medication Sig Dispense Refill   aspirin EC 81 MG tablet Take 81 mg by mouth daily.       busPIRone (BUSPAR) 7.5 MG tablet Take 7.5 mg by mouth 3 (three) times daily.       hydroxychloroquine (PLAQUENIL) 200 MG tablet Take 200 mg by mouth daily.        ibuprofen (ADVIL,MOTRIN) 800 MG tablet Take 800 mg by mouth every 6 (six) hours as needed.       leflunomide (ARAVA) 10 MG tablet Take 1 tablet by mouth daily.       lisinopril (ZESTRIL) 20 MG tablet Take 1 tablet (20 mg total) by mouth daily. 10 tablet 0   methadone (DOLOPHINE) 5 MG tablet Take 1 tablet (5 mg total) by mouth every 8 (eight) hours. Must last 30 days. 90 tablet 0   montelukast (SINGULAIR) 10 MG tablet Take 10 mg by mouth at bedtime.       Multiple Vitamin (MULTIVITAMIN) capsule Take 1 capsule by mouth daily.       rosuvastatin (CRESTOR)  40 MG tablet TAKE 1 TABLET BY MOUTH  DAILY 90 tablet 3   TURMERIC PO Take by mouth daily.        No current facility-administered medications for this visit.      Family History      Family History  Problem Relation Age of Onset   Cancer Brother     Cancer Other     Hypertension Mother     Heart disease Mother     Kidney disease Mother     Heart disease Father     Breast cancer Neg Hx          Social History Social History         Tobacco Use   Smoking status: Every Day      Years: 25      Types: Cigarettes   Smokeless tobacco: Never   Tobacco comments:      Stress smoker. 1 cig every now and then  Substance Use Topics   Alcohol use: Not Currently   Drug use: Never          Review of Systems  Constitutional: Negative.   HENT:  Positive for hearing loss.   Eyes: Negative.   Respiratory: Negative.    Cardiovascular: Negative.   Gastrointestinal: Negative.   Genitourinary:  Positive for frequency.  Musculoskeletal:  Positive for joint pain.  Skin: Negative.   Neurological:  Positive for headaches.  Psychiatric/Behavioral:  The  patient is nervous/anxious.         Physical Exam Blood pressure 138/63, pulse 87, temperature 98.2 F (36.8 C), temperature source Oral, height 5\' 2"  (1.575 m), weight 180 lb 6.4 oz (81.8 kg), SpO2 95 %. Last Weight  Most recent update: 10/14/2022  1:28 PM      Weight  81.8 kg (180 lb 6.4 oz)                     CONSTITUTIONAL: Well developed, and nourished, appropriately responsive and aware without distress.   EYES: Sclera non-icteric.   EARS, NOSE, MOUTH AND THROAT:  The oropharynx is clear. Oral mucosa is pink and moist.    Hearing is intact to voice.  NECK: Trachea is midline, and there is no jugular venous distension.  LYMPH NODES:  Lymph nodes in the neck are not appreciated. RESPIRATORY:   Normal respiratory effort without pathologic use of accessory muscles. CARDIOVASCULAR:   Well perfused.  GI: The abdomen is soft, nontender, and nondistended.    Breast: Marylene Land present as chaperone.  No upper left breast masses, or suspicious nodularity.  No residual ecchymosis or hematoma appreciated.  Multiple scattered seborrheic keratoses of the skin.   MUSCULOSKELETAL:  Symmetrical muscle tone appreciated in all four extremities.    SKIN: Skin turgor is normal. No pathologic skin lesions appreciated.    NEUROLOGIC:  Motor and sensation appear grossly normal.  Cranial nerves are grossly without defect. PSYCH:  Alert and oriented to person, place and time. Affect is appropriate for situation.   Data Reviewed I have personally reviewed what is currently available of the patient's imaging, recent labs and medical records.   Labs:      Latest Ref Rng & Units 01/24/2014    1:25 PM  CBC  WBC 3.6 - 11.0 x10 3/mm 3 5.1   Hemoglobin 12.0 - 16.0 g/dL 16.1   Hematocrit 09.6 - 47.0 % 40.0   Platelets 150 - 440 x10 3/mm 3 272  Latest Ref Rng & Units 01/24/2014    1:25 PM  CMP  Glucose 65 - 99 mg/dL 96   BUN 7 - 18 mg/dL 13   Creatinine 1.61 - 1.30 mg/dL 0.96   Sodium 045 - 409  mmol/L 138   Potassium 3.5 - 5.1 mmol/L 4.0   Chloride 98 - 107 mmol/L 104   CO2 21 - 32 mmol/L 26   Calcium 8.5 - 10.1 mg/dL 8.9   Total Protein 6.4 - 8.2 g/dL 7.9   Total Bilirubin 0.2 - 1.0 mg/dL 0.5   Alkaline Phos Unit/L 74   AST 15 - 37 Unit/L 22   ALT U/L 30     SURGICAL PATHOLOGY CASE: [id number] PATIENT: [name] Surgical Pathology Report  Specimen Submitted: A. Breast, UOQ posterior; left B. Breast, UIQ mid depth; left  Clinical History: Calcs. A - Ribbon-shaped clip placed following stereotactic biopsy of LEFT breast, upper outer quadrant. B - Coil-shaped clip placed following stereotactic biopsy of LEFT breast, upper inner quadrant.  DIAGNOSIS: A. BREAST, LEFT UPPER OUTER QUADRANT, POSTERIOR DEPTH; STEREOTACTIC CORE NEEDLE BIOPSY: - BENIGN MAMMARY PARENCHYMA WITH FIBROCYSTIC AND FIBROADENOMATOID CHANGES, WITH ASSOCIATED COARSE DYSTROPHIC CALCIFICATIONS. - FOCAL COLUMNAR CELL CHANGE WITHOUT ATYPIA, AND MINUTE INTRADUCTAL PAPILLOMA. - NEGATIVE FOR ATYPICAL PROLIFERATIVE BREAST DISEASE.  B. BREAST, LEFT UPPER INNER QUADRANT, MIDDLE DEPTH; STEREOTACTIC CORE NEEDLE BIOPSY: - BENIGN MAMMARY PARENCHYMA DISPLAYING CHANGES COMPATIBLE WITH RADIAL SCAR/COMPLEX SCLEROSING LESION, WITH ASSOCIATED FIBROCYSTIC CHANGES, SCLEROSING ADENOSIS, AND COARSE DYSTROPHIC CALCIFICATIONS. - NEGATIVE FOR ATYPICAL PROLIFERATIVE BREAST DISEASE.      Imaging: Radiological images reviewed:  CLINICAL DATA:  Two groups of calcifications in the left breast seen on most recent screening mammography.   EXAM: DIGITAL DIAGNOSTIC UNILATERAL LEFT MAMMOGRAM WITH TOMOSYNTHESIS   TECHNIQUE: Left digital diagnostic mammography and breast tomosynthesis was performed.   COMPARISON:  Previous exam(s).   ACR Breast Density Category b: There are scattered areas of fibroglandular density.   FINDINGS: Additional mammographic views of the left breast demonstrate 2 groups of indeterminate  calcifications, in the left breast upper outer quadrant, posterior depth, measuring 6 mm, and left breast slightly lower inner quadrant, posterior depth, measuring 8 mm in greatest dimension.   IMPRESSION: Two groups of indeterminate calcifications in the left breast, for which stereotactic core needle biopsies are recommended.   RECOMMENDATION: Two site stereotactic core needle biopsy of the left breast.   I have discussed the findings and recommendations with the patient. If applicable, a reminder letter will be sent to the patient regarding the next appointment.   BI-RADS CATEGORY  4: Suspicious.     Electronically Signed   By: Ted Mcalpine M.D.   On: 06/12/2022 11:32 Within last 24 hrs: No results found.   Assessment   Assessment  2 lesions in the left breast, both associated with similar radiographic findings.  The upper outer quadrant lesion has a minute papilloma that is benign.  It is with vacuum-assisted biopsy technique and its likelihood of upgrading to something worse is very low. The upper inner quadrant lesion has complex sclerosing/radial scar.     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  She realizes that her risk is reasonably low for upstaging her disease, but would like to proceed for her peace of mind.   Therefore we will schedule excisional biopsy, tag directed left breast x 2.   We discussed at length the pros and cons of continued observation versus proceeding with excisional biopsy at this time.  She is well aware that the majority of patients do not have an upgrade or worsening of their diagnosis, however there is always a risk of missing/or delaying risk reducing treatment.  At present she is undecided, but feels that being more aggressive surgically may provide her the peace of mind, especially in lieu of further breast imaging follow-up.   Radial scars -- Complex Sclerosing Lesion (CSL) Radial scars, also called complex sclerosing lesions, are a pathologic diagnosis, usually discovered incidentally when a breast mass or radiologic abnormality is removed or biopsied. Occasionally, radial scars are large enough to be detected on mammography as suspicious spiculated masses, which cannot be reliably differentiated from spiculated carcinoma by imaging alone. Radial scars are characterized microscopically by a fibroelastic core with radiating ducts and lobules. In general, surgical excision is recommended when radial scars or complex sclerosing lesions are diagnosed on core biopsy, based on series showing that 8 to 17 percent of surgical specimens at subsequent excision are positive for malignancy. These high upgrade rates have led to the suggestion that these lesions may actually be premalignant lesions, progressing from scar to hyperplasia to carcinoma. This, however, is controversial, and more recent series suggest the presence of undiagnosed cancer is much lower, with upgrade rates in the range of 0.6 to 3.6 percent. The upgrade rate is even lower with large-volume (eg, vacuum-assisted) needle biopsy.    In a meta-analysis of over  3000 patients with radial scars, the upgrade rate after vacuum-assisted biopsy to invasive and in situ cancer was only 0 and 1 percent, respectively. In a study of 50 patients with radial scar who were followed by active surveillance, no patient progressed on interval imaging at 16 months. The current recommendations from the American Society of Breast Surgeons state that most radial scars "should be excised, although imaging follow-up is reasonable for small, image-detected radial scars that are completely removed or well-sampled with large-gauge devices and in the setting of imaging-pathology concordance".   No additional treatment beyond excision is needed for radial scars. The risk of subsequent breast cancer after excision in this population is small, and chemoprevention is not indicated.   These notes generated with voice recognition software. I apologize for typographical errors.

## 2022-11-26 NOTE — Anesthesia Postprocedure Evaluation (Signed)
Anesthesia Post Note  Patient: TURQUOISE BATTISTA  Procedure(s) Performed: BREAST BIOPSY WITH RADIO FREQUENCY LOCALIZER x 2 (Left)  Patient location during evaluation: PACU Anesthesia Type: General Level of consciousness: awake and alert Pain management: pain level controlled Vital Signs Assessment: post-procedure vital signs reviewed and stable Respiratory status: spontaneous breathing, nonlabored ventilation and respiratory function stable Cardiovascular status: blood pressure returned to baseline and stable Postop Assessment: no apparent nausea or vomiting Anesthetic complications: no   No notable events documented.   Last Vitals:  Vitals:   11/26/22 1030 11/26/22 1037  BP:  (!) 150/63  Pulse: 80 85  Resp: 14 15  Temp: 36.5 C (!) 36.2 C  SpO2: 96% 92%    Last Pain:  Vitals:   11/26/22 1037  TempSrc: Temporal  PainSc: 4                  Foye Deer

## 2022-11-26 NOTE — Transfer of Care (Cosign Needed)
Immediate Anesthesia Transfer of Care Note  Patient: Natasha Gonzales  Procedure(s) Performed: BREAST BIOPSY WITH RADIO FREQUENCY LOCALIZER x 2 (Left)  Patient Location: PACU  Anesthesia Type:General  Level of Consciousness: awake  Airway & Oxygen Therapy: Patient Spontanous Breathing and Patient connected to face mask oxygen  Post-op Assessment: Report given to RN and Post -op Vital signs reviewed and stable  Post vital signs: Reviewed and stable  Last Vitals:  Vitals Value Taken Time  BP 145/62 11/26/22 1004  Temp 36.4 C 11/26/22 1004  Pulse 94 11/26/22 1010  Resp 15 11/26/22 1010  SpO2 95 % 11/26/22 1010  Vitals shown include unvalidated device data.  Last Pain:  Vitals:   11/26/22 1004  TempSrc:   PainSc: 0-No pain         Complications: No notable events documented.

## 2022-11-26 NOTE — Interval H&P Note (Signed)
History and Physical Interval Note:  11/26/2022 8:01 AM  Natasha Gonzales  has presented today for surgery, with the diagnosis of Radial scar left breast.  The various methods of treatment have been discussed with the patient and family. After consideration of risks, benefits and other options for treatment, the patient has consented to  Procedure(s): BREAST BIOPSY WITH RADIO FREQUENCY LOCALIZER x 2 (Left) as a surgical intervention.  The patient's history has been reviewed, patient examined, no change in status, stable for surgery.  I have reviewed the patient's chart and labs.  Questions were answered to the patient's satisfaction.    The left breast is marked.   Campbell Lerner

## 2022-12-03 LAB — SURGICAL PATHOLOGY

## 2022-12-09 ENCOUNTER — Ambulatory Visit (INDEPENDENT_AMBULATORY_CARE_PROVIDER_SITE_OTHER): Payer: Managed Care, Other (non HMO) | Admitting: Surgery

## 2022-12-09 ENCOUNTER — Encounter: Payer: Self-pay | Admitting: Surgery

## 2022-12-09 VITALS — BP 135/72 | HR 86 | Temp 98.0°F | Ht 62.0 in | Wt 185.0 lb

## 2022-12-09 DIAGNOSIS — N6489 Other specified disorders of breast: Secondary | ICD-10-CM

## 2022-12-09 DIAGNOSIS — Z09 Encounter for follow-up examination after completed treatment for conditions other than malignant neoplasm: Secondary | ICD-10-CM

## 2022-12-09 DIAGNOSIS — R928 Other abnormal and inconclusive findings on diagnostic imaging of breast: Secondary | ICD-10-CM

## 2022-12-09 DIAGNOSIS — D242 Benign neoplasm of left breast: Secondary | ICD-10-CM

## 2022-12-09 NOTE — Patient Instructions (Addendum)
Use the Ibuprofen for achiness and pain.  Follow up here in 6 months with a mammogram prior. We will send you a letter about these appointments.   Lumpectomy, Care After The following information offers guidance on how to care for yourself after your procedure. Your health care provider may also give you more specific instructions. If you have problems or questions, contact your health care provider. What can I expect after the procedure? After the procedure, it is common to have: Some pain or redness at the incision site. Breast swelling. Breast tenderness. Stiffness in your arm or shoulder. A change in the shape and feel of your breast. Scar tissue that feels hard to the touch in the area where the lump was removed. Follow these instructions at home: Medicines Take over-the-counter and prescription medicines only as told by your health care provider. If you were prescribed an antibiotic, take it as told by your health care provider. Do not stop taking the antibiotic even if you start to feel better. Ask your health care provider if the medicine prescribed to you: Requires you to avoid driving or using machinery. Can cause constipation. You may need to take these actions to prevent or treat constipation: Drink enough fluid to keep your urine pale yellow. Take over-the-counter or prescription medicines. Eat foods that are high in fiber, such as beans, whole grains, and fresh fruits and vegetables. Limit foods that are high in fat and processed sugars, such as fried or sweet foods. Incision care     Follow instructions from your health care provider about how to take care of your incision. Make sure you: Wash your hands with soap and water for at least 20 seconds before and after you change your bandage (dressing). If soap and water are not available, use hand sanitizer. Change your dressing as told by your health care provider. Leave stitches (sutures), skin glue, or adhesive strips in  place. These skin closures may need to stay in place for 2 weeks or longer. If adhesive strip edges start to loosen and curl up, you may trim the loose edges. Do not remove adhesive strips completely unless your health care provider tells you to do that. Check your incision area every day for signs of infection. Check for: More redness, swelling, or pain. Fluid or blood. Warmth. Pus or a bad smell. Keep your dressing clean and dry. If you were sent home with a surgical drain in place, follow instructions from your health care provider about emptying it. Bathing Do not take baths, swim, or use a hot tub until your health care provider approves. Ask your health care provider if you may take showers. You may only be allowed to take sponge baths. Activity Rest as told by your health care provider. Do not sit for a long time without moving. Get up to take short walks every 1-2 hours. This will improve blood flow and breathing. Ask for help if you feel weak or unsteady. Be careful to avoid any activities that could cause an injury to your arm on the side of your surgery. Do not lift anything that is heavier than 10 lb (4.5 kg), or the limit that you are told, until your health care provider says that it is safe. Avoid lifting with the arm that is on the side of your surgery. Do not carry heavy objects on your shoulder on the side of your surgery. Do exercises to keep your shoulder and arm from getting stiff and swollen. Talk with your  health care provider about which exercises are safe for you. Return to your normal activities as told by your health care provider. Ask your health care provider what activities are safe for you. General instructions Wear a supportive bra as told by your health care provider. Raise (elevate) your arm above the level of your heart while you are sitting or lying down. Do not wear tight jewelry on your arm, wrist, or fingers on the side of your surgery. Wear compression  stockings as told by your health care provider. These stockings help to prevent blood clots and reduce swelling in your legs. If you had any lymph nodes removed during your procedure, be sure to tell all of your health care providers. It is important to share this information before you have certain procedures, such as blood tests or blood pressure measurements. Keep all follow-up visits. You may need to be screened for extra fluid around the lymph nodes and swelling in the breast and arm (lymphedema). Contact a health care provider if: You develop a rash. You have a fever. Your pain worsens or pain medicine is not working. You have swelling, weakness, or numbness in your arm that does not improve after a few weeks. You have new swelling in your breast. You have any of these signs of infection: More redness, swelling, or pain in your incision area. Fluid or blood coming from your incision. Warmth coming from the incision area. Pus or a bad smell coming from your incision. Get help right away if: You have very bad pain in your breast or arm. You have swelling in your legs or arms. You have redness, warmth, or pain in your leg or arm. You have chest pain. You have difficulty breathing. These symptoms may be an emergency. Get help right away. Call 911. Do not wait to see if the symptoms will go away. Do not drive yourself to the hospital. Summary After the procedure, it is common to have breast tenderness, swelling in your breast, and stiffness in your arm and shoulder. Follow instructions from your health care provider about how to take care of your incision. Do not lift anything that is heavier than 10 lb (4.5 kg), or the limit that you are told, until your health care provider says that it is safe. Avoid lifting with the arm that is on the side of your surgery. If you had any lymph nodes removed during your procedure, be sure to tell all of your health care providers. This information is  not intended to replace advice given to you by your health care provider. Make sure you discuss any questions you have with your health care provider. Document Revised: 08/18/2021 Document Reviewed: 08/18/2021 Elsevier Patient Education  2024 [employer].

## 2022-12-09 NOTE — Progress Notes (Signed)
[facility] POST-OP OFFICE VISIT  12/09/2022  HPI: ADIANEZ Gonzales is a 67 y.o. female 13 days s/p excision of left breast lesions x 2 with RFID guidance.    Had a bit of drainage from the medial site postop small amount of blood, but pretty good pain control.  Does not like taking Vicodin.  Vital signs: BP 135/72   Pulse 86   Temp 98 F (36.7 C)   Ht 5\' 2"  (1.575 m)   Wt 185 lb (83.9 kg)   SpO2 95%   BMI 33.84 kg/m    Physical Exam: Constitutional: Appears well Breast, left: Natasha Gonzales is present as chaperone. Skin: Incisions are both clean dry and intact, appears Dermabond is still present.  Flaking off gradually.  There may be some small seroma or hematoma lingering in each site but there is no evidence of significant ecchymosis that would accompany a hematoma.  Assessment/Plan: This is a 66 y.o. female 13 days s/p s/p excision of left breast lesions x 2 with RFID guidance.   SURGICAL PATHOLOGY  CASE: [id number]  PATIENT: Natasha Gonzales  Surgical Pathology Report   Specimen Submitted:  A. Breast, left lateral  B. Breast, left medial   Clinical History: Radial scar left breast   DIAGNOSIS:  A. BREAST, LEFT LATERAL; LUMPECTOMY:  - RESIDUAL INTRADUCTAL PAPILLOMA, EXCISED.  - USUAL DUCTAL HYPERPLASIA, COLUMNAR CELL CHANGE, FIBROADENOMATOID  CHANGE WITH CALCIFICATIONS, AND SCLEROSING ADENOSIS.  - PRIOR BIOPSY SITE CHANGE WITH CLIP.  - RFID TAG IN PLACE.  - NEGATIVE FOR ATYPIA AND MALIGNANCY.   B.  BREAST, LEFT MEDIAL; LUMPECTOMY:  - RESIDUAL COMPLEX SCLEROSING LESION WITH COLUMNAR CELL CHANGE, EXCISED.  - SMALL ( ) INTRADUCTAL PAPILLOMA WITH FLORID DUCTAL HYPERPLASIA,  EXCISED.  - PRIOR BIOPSY SITE CHANGE WITH CLIP.  - RFID TAG IN PLACE.  - NEGATIVE FOR ATYPIA AND MALIGNANCY.   Comment:  Calponin and p63 stains highlight an intact myoepithelial layer in the  area of the intraductal papilloma with florid ductal hyperplasia in part  B.  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   Natasha Gonzales M.D., FACS 12/09/2022, 1:27 PM

## 2023-01-29 ENCOUNTER — Encounter: Payer: Self-pay | Admitting: Student in an Organized Health Care Education/Training Program

## 2023-01-29 ENCOUNTER — Ambulatory Visit
Payer: Managed Care, Other (non HMO) | Attending: Student in an Organized Health Care Education/Training Program | Admitting: Student in an Organized Health Care Education/Training Program

## 2023-01-29 VITALS — BP 153/79 | HR 88 | Temp 97.3°F | Resp 15 | Ht 62.0 in | Wt 180.0 lb

## 2023-01-29 DIAGNOSIS — G894 Chronic pain syndrome: Secondary | ICD-10-CM | POA: Insufficient documentation

## 2023-01-29 DIAGNOSIS — M5136 Other intervertebral disc degeneration, lumbar region: Secondary | ICD-10-CM | POA: Insufficient documentation

## 2023-01-29 DIAGNOSIS — Z0289 Encounter for other administrative examinations: Secondary | ICD-10-CM | POA: Diagnosis present

## 2023-01-29 DIAGNOSIS — Z79891 Long term (current) use of opiate analgesic: Secondary | ICD-10-CM | POA: Diagnosis not present

## 2023-01-29 MED ORDER — METHADONE HCL 5 MG PO TABS
5.0000 mg | ORAL_TABLET | Freq: Three times a day (TID) | ORAL | 0 refills | Status: AC
Start: 2023-02-09 — End: 2023-03-11

## 2023-01-29 MED ORDER — METHADONE HCL 5 MG PO TABS
5.0000 mg | ORAL_TABLET | Freq: Three times a day (TID) | ORAL | 0 refills | Status: DC
Start: 2023-04-10 — End: 2023-04-28

## 2023-01-29 MED ORDER — METHADONE HCL 5 MG PO TABS
5.0000 mg | ORAL_TABLET | Freq: Three times a day (TID) | ORAL | 0 refills | Status: AC
Start: 2023-03-11 — End: 2023-04-10

## 2023-01-29 NOTE — Progress Notes (Signed)
Nursing Pain Medication Assessment:  Safety precautions to be maintained throughout the outpatient stay will include: orient to surroundings, keep bed in low position, maintain call bell within reach at all times, provide assistance with transfer out of bed and ambulation.  Medication Inspection Compliance: Pill count conducted under aseptic conditions, in front of the patient. Neither the pills nor the bottle was removed from the patient's sight at any time. Once count was completed pills were immediately returned to the patient in their original bottle.  Medication: Methadone Pill/Patch Count:  53 of 90 pills remain Pill/Patch Appearance: Markings consistent with prescribed medication Bottle Appearance: Standard pharmacy container. Clearly labeled. Filled Date: 7 / 20 / 2024 Last Medication intake:  Today

## 2023-01-29 NOTE — Progress Notes (Signed)
PROVIDER NOTE: Information contained herein reflects review and annotations entered in association with encounter. Interpretation of such information and data should be left to medically-trained personnel. Information provided to patient can be located elsewhere in the medical record under "Patient Instructions". Document created using STT-dictation technology, any transcriptional errors that may result from process are unintentional.    Patient: Natasha Gonzales  Service Category: E/M  Provider: Edward Jolly, MD  DOB: May 12, 1956  DOS: 01/29/2023  Specialty: Interventional Pain Management  MRN: [id number]  Setting: Ambulatory outpatient  PCP: Franciso Bend, NP  Type: Established Patient    Referring Provider: Franciso Bend, NP  Location: Office  Delivery: Face-to-face     HPI  [name], a 67 y.o. 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 [name] primary complain today is Back Pain and Headache  Last encounter: My last encounter with her was on 10/28/22  Pertinent problems: [name] has Anxiety; Essential hypertension; Rheumatoid arthritis involving multiple sites with positive rheumatoid factor (HCC); Chronic pain syndrome; Lumbar degenerative disc disease; Sacroiliac joint pain; Encounter for long-term methadone use for pain control; and Pain management contract signed on their pertinent problem list. Pain Assessment: Severity of Chronic pain is reported as a 3 /10. Location: Back Lower/around to right groin, down right leg to bottom of right foot. Onset: More than a month ago. Quality: Sharp. Timing: Constant. Modifying factor(s): meds. Vitals:  height is 5\' 2"  (1.575 m) and weight is 180 lb (81.6 kg). Her temperature is 97.3 F (36.3 C) (abnormal). Her blood pressure is 153/79 (abnormal) and her pulse is 88. Her respiration is 15 and oxygen saturation is 95%.   Reason for encounter: medication management.     Patient continues multimodal pain regimen as prescribed.  States that it provides pain relief and improvement in functional status.    Pharmacotherapy Assessment  Analgesic: Methadone 5 mg TID prn #90/month, MME= 45   Monitoring: [place] PMP: PDMP reviewed during this encounter.       Pharmacotherapy: No side-effects or adverse reactions reported. Compliance: No problems identified. Effectiveness: Clinically acceptable.  UDS:  Summary  Date Value Ref Range Status  04/15/2022 Note  Final    Comment:    ==================================================================== [employer] [facility] (MW) ==================================================================== Test                             Result       Flag       Units  Drug Present and Declared for Prescription Verification   Methadone                      3271         EXPECTED   ng/mg creat   EDDP (Methadone Mtb)           2931         EXPECTED   ng/mg creat    Sources of methadone include scheduled prescription medications.    EDDP is an expected metabolite of methadone.  ==================================================================== Test                      Result    Flag   Units      Ref Range   Creatinine              108  mg/dL      >=57 ==================================================================== Declared Medications:  The flagging and interpretation on this report are based on the  following declared medications.  Unexpected results may arise from  inaccuracies in the declared medications.   **Note: The testing scope of this panel includes these medications:   Methadone (Dolophine)   **Note: The testing scope of this panel does not include the  following reported medications:   Aspirin  Buspirone (Buspar)  Desloratadine (Clarinex)  Hydroxychloroquine (Plaquenil)  Ibuprofen (Advil)  Leflunomide (Arava)  Lisinopril (Zestril)  Meloxicam (Mobic)  Montelukast (Singulair)   Multivitamin  Rosuvastatin (Crestor)  Turmeric ==================================================================== For clinical consultation, please call [phone]. ====================================================================       ROS  Constitutional: Denies any fever or chills Gastrointestinal: No reported hemesis, hematochezia, vomiting, or acute GI distress Musculoskeletal:  low back pain Neurological: No reported episodes of acute onset apraxia, aphasia, dysarthria, agnosia, amnesia, paralysis, loss of coordination, or loss of consciousness  Medication Review  Magnesium, Turmeric, Zinc, aspirin EC, busPIRone, hydroxychloroquine, ibuprofen, leflunomide, lisinopril, methadone, methocarbamol, montelukast, multivitamin, rosuvastatin, and sulfaSALAzine  History Review  Allergy: [name] is allergic to erythromycin base. Drug: [name]  reports no history of drug use. Alcohol:  reports that she does not currently use alcohol. Tobacco:  reports that she has been smoking cigarettes. She has been exposed to tobacco smoke. She has never used smokeless tobacco. Social: [name]  reports that she has been smoking cigarettes. She has been exposed to tobacco smoke. She has never used smokeless tobacco. She reports that she does not currently use alcohol. She reports that she does not use drugs. Medical:  has a past medical history of Anxiety, Complication of anesthesia, Depression, Generalized headaches, History of kidney stones, Hyperlipidemia, Hypertension, Ovarian cyst, Pneumonia, and Rheumatoid arthritis (HCC). Surgical: [name]  has a past surgical history that includes Tonsilectomy/adenoidectomy with myringotomy (1984); Abdominal hysterectomy (1993); adhesion (2001); Ovarian cyst removal; Breast biopsy (Left, 06/25/2022); Breast biopsy (Left, 06/25/2022); Breast biopsy (Left, 06/25/2022); Breast biopsy (Left, 06/25/2022); Breast biopsy (11/05/2022); Knee arthroscopy  (Right); Colonoscopy w/ polypectomy; and Breast biopsy with radio frequency localizer (Left, 11/26/2022). Family: family history includes Cancer in her brother and another family member; Heart disease in her father and mother; Hypertension in her mother; Kidney disease in her mother.  Laboratory Chemistry Profile   Renal Lab Results  Component Value Date   BUN 21 11/19/2022   CREATININE 1.11 (H) 11/19/2022   GFRAA >60 01/24/2014   GFRNONAA 55 (L) 11/19/2022     Hepatic Lab Results  Component Value Date   AST 40 11/19/2022   ALT 38 11/19/2022   ALBUMIN 4.7 11/19/2022   ALKPHOS 63 11/19/2022   LIPASE 129 01/24/2014     Electrolytes Lab Results  Component Value Date   NA 140 11/19/2022   K 4.1 11/19/2022   CL 105 11/19/2022   CALCIUM 9.4 11/19/2022     Bone No results found for: "VD25OH", "VD125OH2TOT", "WC3762GB1", "DV7616WV3", "25OHVITD1", "25OHVITD2", "25OHVITD3", "TESTOFREE", "TESTOSTERONE"   Inflammation (CRP: Acute Phase) (ESR: Chronic Phase) No results found for: "CRP", "ESRSEDRATE", "LATICACIDVEN"     Note: Above Lab results reviewed.  Recent Imaging Review  MM BREAST SURGICAL SPECIMEN CLINICAL DATA:  Status post radiofrequency tag localized LEFT breast excisional biopsy. 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  EXAM: SPECIMEN RADIOGRAPH OF THE LEFT BREAST  COMPARISON:  Previous exam(s).  FINDINGS:  Status post excision of the LEFT breast. The radiofrequency tag and the coil shaped tissue marking clip are present within the specimen. This was discussed by telephone with Dr. Jennet Maduro at the time of interpretation.  IMPRESSION: Specimen radiograph of the LEFT breast.  Electronically Signed   By: Hulan Saas M.D.   On: 11/26/2022 11:56 MM Breast Surgical Specimen CLINICAL DATA:  Status post radiofrequency tag  localized LEFT breast excisional biopsy. 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  EXAM: SPECIMEN RADIOGRAPH OF THE LEFT BREAST  COMPARISON:  Previous exam(s).  FINDINGS: Status post excision of the LEFT breast. The radiofrequency tag and the ribbon shaped tissue marking clip are present within the specimen.  IMPRESSION: Specimen radiograph of the LEFT breast.  Electronically Signed   By: Hulan Saas M.D.   On: 11/26/2022 09:23  Note: Reviewed        Physical Exam  General appearance: Well nourished, well developed, and well hydrated. In no apparent acute distress Mental status: Alert, oriented x 3 (person, place, & time)       Respiratory: No evidence of acute respiratory distress Eyes: PERLA Vitals: BP (!) 153/79   Pulse 88   Temp (!) 97.3 F (36.3 C)   Resp 15   Ht 5\' 2"  (1.575 m)   Wt 180 lb (81.6 kg)   SpO2 95%   BMI 32.92 kg/m  BMI: Estimated body mass index is 32.92 kg/m as calculated from the following:   Height as of this encounter: 5\' 2"  (1.575 m).   Weight as of this encounter: 180 lb (81.6 kg). Ideal: Ideal body weight: 50.1 kg (110 lb 7.2 oz) Adjusted ideal body weight: 62.7 kg (138 lb 4.3 oz)  Lumbar spine: Skin & Axial Inspection: No masses, redness, or swelling Alignment: Symmetrical Functional ROM: Unrestricted ROM       Stability: No instability detected Muscle Tone/Strength: Functionally intact. No obvious neuro-muscular anomalies detected. Sensory (Neurological): Musculoskeletal pain pattern  Gait & Posture Assessment  Ambulation: Unassisted Gait: Relatively normal for age and body habitus Posture: WNL    Lower Extremity Exam      Side: Right lower extremity   Side: Left lower extremity  Stability: No instability observed           Stability: No instability observed          Skin & Extremity  Inspection: Skin color, temperature, and hair growth are WNL. No peripheral edema or cyanosis. No masses, redness, swelling, asymmetry, or associated skin lesions. No contractures.   Skin & Extremity Inspection: Skin color, temperature, and hair growth are WNL. No peripheral edema or cyanosis. No masses, redness, swelling, asymmetry, or associated skin lesions. No contractures.  Functional ROM: pain restricted           Functional ROM: Unrestricted ROM                  Muscle Tone/Strength: Functionally intact. No obvious neuro-muscular anomalies detected.   Muscle Tone/Strength: Functionally intact. No obvious neuro-muscular anomalies detected.  Sensory (Neurological): Musculoskeletal pain pattern, severe   Sensory (Neurological): Musculoskeletal pain pattern        DTR: Patellar: deferred today Achilles: deferred today Plantar: deferred today   DTR: Patellar: deferred today Achilles: deferred today Plantar: deferred today  Palpation: No palpable anomalies   Palpation: No palpable anomalies    Assessment   Status Diagnosis  Controlled Controlled  Controlled 1. 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 Chronic pain syndrome   3. Lumbar degenerative disc disease   4. Pain management contract signed           Plan of Care   [name] has a current medication list which includes the following long-term medication(s): lisinopril, montelukast, rosuvastatin, and sulfasalazine.  1. Chronic pain syndrome - methadone (DOLOPHINE) 5 MG tablet; Take 1 tablet (5 mg total) by mouth every 8 (eight) hours. Must last 30 days.  Dispense: 90 tablet; Refill: 0 - methadone (DOLOPHINE) 5 MG tablet; Take 1 tablet (5 mg total) by mouth every 8 (eight) hours. Must last 30 days.  Dispense: 90 tablet; Refill: 0 - methadone (DOLOPHINE) 5 MG tablet; Take 1 tablet (5 mg total) by mouth every 8 (eight) hours. Must last 30 days.  Dispense: 90 tablet; Refill: 0  2. Encounter for  long-term methadone use for pain control  3. Lumbar degenerative disc disease  4. Pain management contract signed  EKG and UDS at next visit  Pharmacotherapy (Medications Ordered): Meds ordered this encounter  Medications   methadone (DOLOPHINE) 5 MG tablet    Sig: Take 1 tablet (5 mg total) by mouth every 8 (eight) hours. Must last 30 days.    Dispense:  90 tablet    Refill:  0    [place] STOP ACT - Not applicable. Fill one day early if pharmacy is closed on scheduled refill date.   methadone (DOLOPHINE) 5 MG tablet    Sig: Take 1 tablet (5 mg total) by mouth every 8 (eight) hours. Must last 30 days.    Dispense:  90 tablet    Refill:  0    [place] STOP ACT - Not applicable. Fill one day early if pharmacy is closed on scheduled refill date.   methadone (DOLOPHINE) 5 MG tablet    Sig: Take 1 tablet (5 mg total) by mouth every 8 (eight) hours. Must last 30 days.    Dispense:  90 tablet    Refill:  0    [place] STOP ACT - Not applicable. Fill one day early if pharmacy is closed on scheduled refill date.   No orders of the defined types were placed in this encounter.    Follow-up plan:   Return in about 14 weeks (around 05/07/2023) for Medication Management, in person.    Recent Visits No visits were found meeting these conditions. Showing recent visits within past 90 days and meeting all other requirements Today's Visits Date Type Provider Dept  01/29/23 Office Visit Edward Jolly, MD [facility]  Showing today's visits and meeting all other requirements Future Appointments Date Type Provider Dept  04/28/23 Appointment Edward Jolly, MD [facility]  Showing future appointments within next 90 days and meeting all other requirements  I discussed the assessment and treatment plan with the patient. The patient was provided an opportunity to ask questions and all were answered. The patient agreed with the plan and demonstrated an understanding of the  instructions.  Patient advised to call back or seek an in-person evaluation if the symptoms or condition worsens.  Duration of encounter: 30 minutes.  Note by: Edward Jolly, MD Date: 01/29/2023; Time: 11:40 AM

## 2023-04-21 ENCOUNTER — Other Ambulatory Visit: Payer: Self-pay

## 2023-04-21 DIAGNOSIS — Z1231 Encounter for screening mammogram for malignant neoplasm of breast: Secondary | ICD-10-CM

## 2023-04-28 ENCOUNTER — Encounter: Payer: Self-pay | Admitting: Student in an Organized Health Care Education/Training Program

## 2023-04-28 ENCOUNTER — Ambulatory Visit
Payer: Managed Care, Other (non HMO) | Attending: Student in an Organized Health Care Education/Training Program | Admitting: Student in an Organized Health Care Education/Training Program

## 2023-04-28 VITALS — BP 163/85 | HR 94 | Temp 97.5°F | Resp 18 | Ht 62.0 in | Wt 175.0 lb

## 2023-04-28 DIAGNOSIS — M533 Sacrococcygeal disorders, not elsewhere classified: Secondary | ICD-10-CM

## 2023-04-28 DIAGNOSIS — M5136 Other intervertebral disc degeneration, lumbar region with discogenic back pain only: Secondary | ICD-10-CM | POA: Diagnosis not present

## 2023-04-28 DIAGNOSIS — G894 Chronic pain syndrome: Secondary | ICD-10-CM

## 2023-04-28 DIAGNOSIS — Z0289 Encounter for other administrative examinations: Secondary | ICD-10-CM | POA: Diagnosis present

## 2023-04-28 DIAGNOSIS — Z79891 Long term (current) use of opiate analgesic: Secondary | ICD-10-CM

## 2023-04-28 MED ORDER — METHADONE HCL 5 MG PO TABS
5.0000 mg | ORAL_TABLET | Freq: Three times a day (TID) | ORAL | 0 refills | Status: AC
Start: 2023-05-17 — End: 2023-06-16

## 2023-04-28 MED ORDER — METHADONE HCL 5 MG PO TABS
5.0000 mg | ORAL_TABLET | Freq: Three times a day (TID) | ORAL | 0 refills | Status: DC
Start: 2023-07-16 — End: 2023-08-11

## 2023-04-28 MED ORDER — METHADONE HCL 5 MG PO TABS
5.0000 mg | ORAL_TABLET | Freq: Three times a day (TID) | ORAL | 0 refills | Status: AC
Start: 2023-06-16 — End: 2023-07-16

## 2023-04-28 NOTE — Progress Notes (Signed)
PROVIDER NOTE: Information contained herein reflects review and annotations entered in association with encounter. Interpretation of such information and data should be left to medically-trained personnel. Information provided to patient can be located elsewhere in the medical record under "Patient Instructions". Document created using STT-dictation technology, any transcriptional errors that may result from process are unintentional.    Patient: Natasha Gonzales  Service Category: E/M  Provider: Edward Jolly, MD  DOB: 1955-07-06  DOS: 04/28/2023  Specialty: Interventional Pain Management  MRN: [id number]  Setting: Ambulatory outpatient  PCP: Natasha Bend, NP  Type: Established Patient    Referring Provider: Franciso Bend, NP  Location: Office  Delivery: Face-to-face     HPI  [name], a 67 y.o. year old female, is here today because of her Chronic pain syndrome [G89.4]. Natasha Gonzales primary complain today is low back pain  Last encounter: My last encounter with her was on 01/29/23  Pertinent problems: Natasha Gonzales has Anxiety; Essential hypertension; Rheumatoid arthritis involving multiple sites with positive rheumatoid factor (HCC); Chronic pain syndrome; Lumbar degenerative disc disease; Sacroiliac joint pain; Encounter for long-term methadone use for pain control; and Pain management contract signed on their pertinent problem list. Pain Assessment: Severity of Chronic pain is reported as a 2 /10. Location: Back Lower, Right/radistes from lower right back itno groin area down the inner right leg to right foot. Onset: More than a month ago. Quality: Constant, Sharp. Timing: Constant. Modifying factor(s): Methadone, movements, and heat. Vitals:  height is 5\' 2"  (1.575 m) and weight is 175 lb (79.4 kg). Her temporal temperature is 97.5 F (36.4 C) (abnormal). Her blood pressure is 163/85 (abnormal) and her pulse is 94. Her respiration is 18 and oxygen saturation is 97%.   Reason for  encounter: medication management.    Patient continues multimodal pain regimen as prescribed.  States that it provides pain relief and improvement in functional status.    Pharmacotherapy Assessment  Analgesic: Methadone 5 mg TID prn #90/month, MME= 45   Monitoring: [place] PMP: PDMP reviewed during this encounter.       Pharmacotherapy: No side-effects or adverse reactions reported. Compliance: No problems identified. Effectiveness: Clinically acceptable.  UDS:  Summary  Date Value Ref Range Status  04/15/2022 Note  Final    Comment:    ==================================================================== [employer] Select 13 (MW) ==================================================================== Test                             Result       Flag       Units  Drug Present and Declared for Prescription Verification   Methadone                      3271         EXPECTED   ng/mg creat   EDDP (Methadone Mtb)           2931         EXPECTED   ng/mg creat    Sources of methadone include scheduled prescription medications.    EDDP is an expected metabolite of methadone.  ==================================================================== Test                      Result    Flag   Units      Ref Range   Creatinine              108  mg/dL      >=29 ==================================================================== Declared Medications:  The flagging and interpretation on this report are based on the  following declared medications.  Unexpected results may arise from  inaccuracies in the declared medications.   **Note: The testing scope of this panel includes these medications:   Methadone (Dolophine)   **Note: The testing scope of this panel does not include the  following reported medications:   Aspirin  Buspirone (Buspar)  Desloratadine (Clarinex)  Hydroxychloroquine (Plaquenil)  Ibuprofen (Advil)  Leflunomide (Arava)  Lisinopril (Zestril)  Meloxicam (Mobic)   Montelukast (Singulair)  Multivitamin  Rosuvastatin (Crestor)  Turmeric ==================================================================== For clinical consultation, please call [phone]. ====================================================================       ROS  Constitutional: Denies any fever or chills Gastrointestinal: No reported hemesis, hematochezia, vomiting, or acute GI distress Musculoskeletal:  low back pain Neurological: No reported episodes of acute onset apraxia, aphasia, dysarthria, agnosia, amnesia, paralysis, loss of coordination, or loss of consciousness  Medication Review  Magnesium, Turmeric, Zinc, aspirin EC, busPIRone, hydroxychloroquine, ibuprofen, leflunomide, lisinopril, methadone, methocarbamol, montelukast, multivitamin, rosuvastatin, and sulfaSALAzine  History Review  Allergy: Natasha Gonzales is allergic to erythromycin base. Drug: Natasha Gonzales  reports no history of drug use. Alcohol:  reports that she does not currently use alcohol. Tobacco:  reports that she has been smoking cigarettes. She has been exposed to tobacco smoke. She has never used smokeless tobacco. Social: Natasha Gonzales  reports that she has been smoking cigarettes. She has been exposed to tobacco smoke. She has never used smokeless tobacco. She reports that she does not currently use alcohol. She reports that she does not use drugs. Medical:  has a past medical history of Anxiety, Complication of anesthesia, Depression, Generalized headaches, History of kidney stones, Hyperlipidemia, Hypertension, Ovarian cyst, Pneumonia, and Rheumatoid arthritis (HCC). Surgical: Ms. Been  has a past surgical history that includes Tonsilectomy/adenoidectomy with myringotomy (1984); Abdominal hysterectomy (1993); adhesion (2001); Ovarian cyst removal; Breast biopsy (Left, 06/25/2022); Breast biopsy (Left, 06/25/2022); Breast biopsy (Left, 06/25/2022); Breast biopsy (Left, 06/25/2022); Breast biopsy  (11/05/2022); Knee arthroscopy (Right); Colonoscopy w/ polypectomy; and Breast biopsy with radio frequency localizer (Left, 11/26/2022). Family: family history includes Cancer in her brother and another family member; Heart disease in her father and mother; Hypertension in her mother; Kidney disease in her mother.  Laboratory Chemistry Profile   Renal Lab Results  Component Value Date   BUN 21 11/19/2022   CREATININE 1.11 (H) 11/19/2022   GFRAA >60 01/24/2014   GFRNONAA 55 (L) 11/19/2022     Hepatic Lab Results  Component Value Date   AST 40 11/19/2022   ALT 38 11/19/2022   ALBUMIN 4.7 11/19/2022   ALKPHOS 63 11/19/2022   LIPASE 129 01/24/2014     Electrolytes Lab Results  Component Value Date   NA 140 11/19/2022   K 4.1 11/19/2022   CL 105 11/19/2022   CALCIUM 9.4 11/19/2022     Bone No results found for: "VD25OH", "VD125OH2TOT", "QI6962XB2", "WU1324MW1", "25OHVITD1", "25OHVITD2", "25OHVITD3", "TESTOFREE", "TESTOSTERONE"   Inflammation (CRP: Acute Phase) (ESR: Chronic Phase) No results found for: "CRP", "ESRSEDRATE", "LATICACIDVEN"     Note: Above Lab results reviewed.  Recent Imaging Review  MM BREAST SURGICAL SPECIMEN CLINICAL DATA:  Status post radiofrequency tag localized LEFT breast excisional biopsy. 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  EXAM: SPECIMEN RADIOGRAPH OF THE LEFT BREAST  COMPARISON:  Previous exam(s).  FINDINGS:  Status post excision of the LEFT breast. The radiofrequency tag and the coil shaped tissue marking clip are present within the specimen. This was discussed by telephone with Dr. Jennet Maduro at the time of interpretation.  IMPRESSION: Specimen radiograph of the LEFT breast.  Electronically Signed   By: Hulan Saas M.D.   On: 11/26/2022 11:56 MM Breast Surgical Specimen CLINICAL DATA:   Status post radiofrequency tag localized LEFT breast excisional biopsy. 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  EXAM: SPECIMEN RADIOGRAPH OF THE LEFT BREAST  COMPARISON:  Previous exam(s).  FINDINGS: Status post excision of the LEFT breast. The radiofrequency tag and the ribbon shaped tissue marking clip are present within the specimen.  IMPRESSION: Specimen radiograph of the LEFT breast.  Electronically Signed   By: Hulan Saas M.D.   On: 11/26/2022 09:23  Note: Reviewed        Physical Exam  General appearance: Well nourished, well developed, and well hydrated. In no apparent acute distress Mental status: Alert, oriented x 3 (person, place, & time)       Respiratory: No evidence of acute respiratory distress Eyes: PERLA Vitals: BP (!) 163/85   Pulse 94   Temp (!) 97.5 F (36.4 C) (Temporal)   Resp 18   Ht 5\' 2"  (1.575 m)   Wt 175 lb (79.4 kg)   SpO2 97%   BMI 32.01 kg/m  BMI: Estimated body mass index is 32.01 kg/m as calculated from the following:   Height as of this encounter: 5\' 2"  (1.575 m).   Weight as of this encounter: 175 lb (79.4 kg). Ideal: Ideal body weight: 50.1 kg (110 lb 7.2 oz) Adjusted ideal body weight: 61.8 kg (136 lb 4.3 oz)  Lumbar spine: Skin & Axial Inspection: No masses, redness, or swelling Alignment: Symmetrical Functional ROM: Unrestricted ROM       Stability: No instability detected Muscle Tone/Strength: Functionally intact. No obvious neuro-muscular anomalies detected. Sensory (Neurological): Musculoskeletal pain pattern  Gait & Posture Assessment  Ambulation: Unassisted Gait: Relatively normal for age and body habitus Posture: WNL    Lower Extremity Exam      Side: Right lower extremity   Side: Left lower extremity  Stability: No instability observed           Stability: No instability  observed          Skin & Extremity Inspection: Skin color, temperature, and hair growth are WNL. No peripheral edema or cyanosis. No masses, redness, swelling, asymmetry, or associated skin lesions. No contractures.   Skin & Extremity Inspection: Skin color, temperature, and hair growth are WNL. No peripheral edema or cyanosis. No masses, redness, swelling, asymmetry, or associated skin lesions. No contractures.  Functional ROM: pain restricted           Functional ROM: Unrestricted ROM                  Muscle Tone/Strength: Functionally intact. No obvious neuro-muscular anomalies detected.   Muscle Tone/Strength: Functionally intact. No obvious neuro-muscular anomalies detected.  Sensory (Neurological): Musculoskeletal pain pattern, severe   Sensory (Neurological): Musculoskeletal pain pattern        DTR: Patellar: deferred today Achilles: deferred today Plantar: deferred today   DTR: Patellar: deferred today Achilles: deferred today Plantar: deferred today  Palpation: No palpable anomalies   Palpation: No palpable anomalies    Assessment   Status Diagnosis  Controlled  Controlled Controlled 1. Chronic pain syndrome   2. 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 Degeneration of intervertebral disc of lumbar region with discogenic back pain   4. Pain management contract signed   5. Sacroiliac joint pain       Plan of Care   Natasha Gonzales has a current medication list which includes the following long-term medication(s): lisinopril, montelukast, rosuvastatin, and sulfasalazine.  1. Chronic pain syndrome - methadone (DOLOPHINE) 5 MG tablet; Take 1 tablet (5 mg total) by mouth every 8 (eight) hours. Must last 30 days.  Dispense: 90 tablet; Refill: 0 - methadone (DOLOPHINE) 5 MG tablet; Take 1 tablet (5 mg total) by mouth every 8 (eight) hours. Must last 30 days.  Dispense: 90 tablet; Refill: 0 - methadone (DOLOPHINE) 5 MG tablet; Take 1 tablet (5 mg total) by  mouth every 8 (eight) hours. Must last 30 days.  Dispense: 90 tablet; Refill: 0 - [employer]ox[employer] Select 13 (MW), Urine - EKG 12-Lead; Standing  2. Encounter for long-term methadone use for pain control - ToxASSURE Select 13 (MW), Urine - EKG 12-Lead; Standing  3. Degeneration of intervertebral disc of lumbar region with discogenic back pain  4. Pain management contract signed  5. Sacroiliac joint pain    Pharmacotherapy (Medications Ordered): Meds ordered this encounter  Medications   methadone (DOLOPHINE) 5 MG tablet    Sig: Take 1 tablet (5 mg total) by mouth every 8 (eight) hours. Must last 30 days.    Dispense:  90 tablet    Refill:  0    [place] STOP ACT - Not applicable. Fill one day early if pharmacy is closed on scheduled refill date.   methadone (DOLOPHINE) 5 MG tablet    Sig: Take 1 tablet (5 mg total) by mouth every 8 (eight) hours. Must last 30 days.    Dispense:  90 tablet    Refill:  0    [place] STOP ACT - Not applicable. Fill one day early if pharmacy is closed on scheduled refill date.   methadone (DOLOPHINE) 5 MG tablet    Sig: Take 1 tablet (5 mg total) by mouth every 8 (eight) hours. Must last 30 days.    Dispense:  90 tablet    Refill:  0    [place] STOP ACT - Not applicable. Fill one day early if pharmacy is closed on scheduled refill date.   Orders Placed This Encounter  Procedures   ToxASSURE Select 13 (MW), Urine    Volume: 30 ml(s). Minimum 3 ml of urine is needed. Document temperature of fresh sample. Indications: Long term (current) use of opiate analgesic (Z30.865)    Order Specific Question:   Release to patient    Answer:   Immediate   EKG 12-Lead    If the QTc interval exceeds 500 ms, notify ordering physician immediately.    Standing Status:   Standing    Number of Occurrences:   3    Standing Expiration Date:   04/27/2024    Scheduling Instructions:     Screening ECG Testing for Q-T interval prolongation and risk of torsades de pointes (TdP) on Methadone  patient.     Follow-up plan:   Return in about 4 months (around 08/13/2023) for MM, F2F.    Recent Visits Date Type Provider Dept  01/29/23 Office Visit Edward Jolly, MD [facility]  Showing recent visits within past 90 days and meeting all other requirements Today's Visits Date Type Provider  Dept  04/28/23 Office Visit Edward Jolly, MD [facility]  Showing today's visits and meeting all other requirements Future Appointments No visits were found meeting these conditions. Showing future appointments within next 90 days and meeting all other requirements  I discussed the assessment and treatment plan with the patient. The patient was provided an opportunity to ask questions and all were answered. The patient agreed with the plan and demonstrated an understanding of the instructions.  Patient advised to call back or seek an in-person evaluation if the symptoms or condition worsens.  Duration of encounter: 30 minutes.  Note by: Edward Jolly, MD Date: 04/28/2023; Time: 11:10 AM

## 2023-04-28 NOTE — Patient Instructions (Addendum)
Medication Rules  Applies to: All patients receiving prescriptions (written or electronic).  Pharmacy of record: Pharmacy where electronic prescriptions will be sent. If written prescriptions are taken to a different pharmacy, please inform the nursing staff. The pharmacy listed in the electronic medical record should be the one where you would like electronic prescriptions to be sent.  Prescription refills: Only during scheduled appointments. Applies to both, written and electronic prescriptions.  NOTE: The following applies primarily to controlled substances (Opioid* Pain Medications).   Patient's responsibilities: Pain Pills: Bring all pain pills to every appointment (except for procedure appointments). Pill Bottles: Bring pills in original pharmacy bottle. Always bring newest bottle. Bring bottle, even if empty. Medication refills: You are responsible for knowing and keeping track of what medications you need refilled. The day before your appointment, write a list of all prescriptions that need to be refilled. Bring that list to your appointment and give it to the admitting nurse. Prescriptions will be written only during appointments. If you forget a medication, it will not be "Called in", "Faxed", or "electronically sent". You will need to get another appointment to get these prescribed. Prescription Accuracy: You are responsible for carefully inspecting your prescriptions before leaving our office. Have the discharge nurse carefully go over each prescription with you, before taking them home. Make sure that your name is accurately spelled, that your address is correct. Check the name and dose of your medication to make sure it is accurate. Check the number of pills, and the written instructions to make sure they are clear and accurate. Make sure that you are given enough medication to last until your next medication refill appointment. Taking Medication: Take medication as prescribed. Never  take more pills than instructed. Never take medication more frequently than prescribed. Taking less pills or less frequently is permitted and encouraged, when it comes to controlled substances (written prescriptions).  Inform other Doctors: Always inform, all of your healthcare providers, of all the medications you take. Pain Medication from other Providers: You are not allowed to accept any additional pain medication from any other Doctor or Healthcare provider. There are two exceptions to this rule. (see below) In the event that you require additional pain medication, you are responsible for notifying us, as stated below. Medication Agreement: You are responsible for carefully reading and following our Medication Agreement. This must be signed before receiving any prescriptions from our practice. Safely store a copy of your signed Agreement. Violations to the Agreement will result in no further prescriptions. (Additional copies of our Medication Agreement are available upon request.) Laws, Rules, & Regulations: All patients are expected to follow all [address] and [employer], Rules, [employer]. Ignorance of the Laws does not constitute a valid excuse. The use of any illegal substances is prohibited. Adopted CDC guidelines & recommendations: Target dosing levels will be at or below 60 MME/day. Use of benzodiazepines** is not recommended.  Pick up dates: 11/24; 06/16/23; 07/16/23  Exceptions: There are only two exceptions to the rule of not receiving pain medications from other Healthcare Providers. Exception #1 (Emergencies): In the event of an emergency (i.e.: accident requiring emergency care), you are allowed to receive additional pain medication. However, you are responsible for: As soon as you are able, call our office [phone], at any time of the day or night, and leave a message stating your name, the date and nature of the emergency, and the name and dose of the medication  prescribed. In the event that your  call is answered by a member of our staff, make sure to document and save the date, time, and the name of the person that took your information.  Exception #2 (Planned Surgery): In the event that you are scheduled by another doctor or dentist to have any type of surgery or procedure, you are allowed (for a period no longer than 30 days), to receive additional pain medication, for the acute post-op pain. However, in this case, you are responsible for picking up a copy of our "Post-op Pain Management for Surgeons" handout, and giving it to your surgeon or dentist. This document is available at our office, and does not require an appointment to obtain it. Simply go to our office during business hours (Monday-Thursday from 8:00 AM to 4:00 PM) (Friday 8:00 AM to 12:00 Noon) or if you have a scheduled appointment with [place], prior to your surgery, and ask for it by name. In addition, you will need to provide [place] with your name, name of your surgeon, type of surgery, and date of procedure or surgery.  *Opioid medications include: morphine, codeine, oxycodone, oxymorphone, hydrocodone, hydromorphone, meperidine, tramadol, tapentadol, buprenorphine, fentanyl, methadone. **Benzodiazepine medications include: diazepam (Valium), alprazolam (Xanax), clonazepam (Klonopine), lorazepam (Ativan), clorazepate (Tranxene), chlordiazepoxide (Librium), estazolam (Prosom), oxazepam (Serax), temazepam (Restoril), triazolam (Halcion) (Last updated: 08/20/2017)

## 2023-04-28 NOTE — Progress Notes (Signed)
Nursing Pain Medication Assessment:  Safety precautions to be maintained throughout the outpatient stay will include: orient to surroundings, keep bed in low position, maintain call bell within reach at all times, provide assistance with transfer out of bed and ambulation.   Medication Inspection Compliance: Pill count conducted under aseptic conditions, in front of the patient. Neither the pills nor the bottle was removed from the patient's sight at any time. Once count was completed pills were immediately returned to the patient in their original bottle.  Medication: Methadone Pill/Patch Count:  70 of 90 pills remain Pill/Patch Appearance: Markings consistent with prescribed medication Bottle Appearance: Standard pharmacy container. Clearly labeled. Filled Date: 9 / 25 / 2024 Last Medication intake:  Today

## 2023-05-01 LAB — TOXASSURE SELECT 13 (MW), URINE

## 2023-05-15 ENCOUNTER — Ambulatory Visit
Admission: RE | Admit: 2023-05-15 | Discharge: 2023-05-15 | Disposition: A | Discharge status: Home / Self Care | Payer: Managed Care, Other (non HMO) | Source: Ambulatory Visit | Attending: Family Medicine | Admitting: Family Medicine

## 2023-05-15 DIAGNOSIS — Z5181 Encounter for therapeutic drug level monitoring: Secondary | ICD-10-CM | POA: Diagnosis not present

## 2023-05-15 DIAGNOSIS — G894 Chronic pain syndrome: Secondary | ICD-10-CM | POA: Insufficient documentation

## 2023-05-15 DIAGNOSIS — Z79891 Long term (current) use of opiate analgesic: Secondary | ICD-10-CM | POA: Diagnosis not present

## 2023-06-04 ENCOUNTER — Ambulatory Visit: Payer: Medicare Other | Admitting: Surgery

## 2023-06-05 ENCOUNTER — Ambulatory Visit
Admission: RE | Admit: 2023-06-05 | Discharge: 2023-06-05 | Disposition: A | Discharge status: Home / Self Care | Payer: Managed Care, Other (non HMO) | Source: Ambulatory Visit | Attending: Surgery | Admitting: Surgery

## 2023-06-05 DIAGNOSIS — Z1231 Encounter for screening mammogram for malignant neoplasm of breast: Secondary | ICD-10-CM | POA: Diagnosis present

## 2023-06-10 NOTE — Progress Notes (Signed)
[facility] POST-OP OFFICE VISIT  06/12/2023  HPI: Natasha Gonzales is a 67 y.o. female s/p excision of left breast lesions x 2 with RFID guidance.    She comes in follow-up with her bilateral screening mammography.  She has no breast issues whatsoever.   Vital signs: BP (!) 150/81   Pulse 96   Temp 98.2 F (36.8 C) (Oral)   Ht 5\' 2"  (1.575 m)   Wt 191 lb 6.4 oz (86.8 kg)   SpO2 96%   BMI 35.01 kg/m    Physical Exam: Constitutional: Appears well Breast, left: Natasha Gonzales is present as chaperone.  There are various nonpathologic skin lesions of the breast and chest wall skin, no appreciable suspicious dermal lesions, breast nodularity or masses of suspicion. Chest: Clear to auscultation. Heart: Regular rate and rhythm.  Assessment/Plan: This is a 67 y.o. female  s/p s/p excision of left breast lesions x 2 with RFID guidance.   SURGICAL PATHOLOGY  CASE: [id number]  PATIENT: Natasha Gonzales  Surgical Pathology Report   Specimen Submitted:  A. Breast, left lateral  B. Breast, left medial   Clinical History: Radial scar left breast   DIAGNOSIS:  A. BREAST, LEFT LATERAL; LUMPECTOMY:  - RESIDUAL INTRADUCTAL PAPILLOMA, EXCISED.  - USUAL DUCTAL HYPERPLASIA, COLUMNAR CELL CHANGE, FIBROADENOMATOID  CHANGE WITH CALCIFICATIONS, AND SCLEROSING ADENOSIS.  - PRIOR BIOPSY SITE CHANGE WITH CLIP.  - RFID TAG IN PLACE.  - NEGATIVE FOR ATYPIA AND MALIGNANCY.   B.  BREAST, LEFT MEDIAL; LUMPECTOMY:  - RESIDUAL COMPLEX SCLEROSING LESION WITH COLUMNAR CELL CHANGE, EXCISED.  - SMALL ( ) INTRADUCTAL PAPILLOMA WITH FLORID DUCTAL HYPERPLASIA,  EXCISED.  - PRIOR BIOPSY SITE CHANGE WITH CLIP.  - RFID TAG IN PLACE.  - NEGATIVE FOR ATYPIA AND MALIGNANCY.   Comment:  Calponin and p63 stains highlight an intact myoepithelial layer in the  area of the intraductal papilloma with florid ductal hyperplasia in part  B.   CLINICAL DATA:  Screening.   EXAM: DIGITAL SCREENING  BILATERAL MAMMOGRAM WITH TOMOSYNTHESIS AND CAD   TECHNIQUE: Bilateral screening digital craniocaudal and mediolateral oblique mammograms were obtained. Bilateral screening digital breast tomosynthesis was performed. The images were evaluated with computer-aided detection.   COMPARISON:  Previous exam(s).   ACR Breast Density Category b: There are scattered areas of fibroglandular density.   FINDINGS: There are no findings suspicious for malignancy.   IMPRESSION: No mammographic evidence of malignancy. A result letter of this screening mammogram will be mailed directly to the patient.   RECOMMENDATION: Screening mammogram in one year. (Code:SM-B-01Y)   BI-RADS CATEGORY  1: Negative.     Electronically Signed   By: Natasha Gonzales M.D.   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   Natasha Gonzales M.D., FACS 06/12/2023, 5:27 PM

## 2023-06-11 ENCOUNTER — Encounter: Payer: Self-pay | Admitting: Surgery

## 2023-06-11 ENCOUNTER — Ambulatory Visit (INDEPENDENT_AMBULATORY_CARE_PROVIDER_SITE_OTHER): Payer: Medicare Other | Admitting: Surgery

## 2023-06-11 VITALS — BP 150/81 | HR 96 | Temp 98.2°F | Ht 62.0 in | Wt 191.4 lb

## 2023-06-11 DIAGNOSIS — D242 Benign neoplasm of left breast: Secondary | ICD-10-CM | POA: Diagnosis not present

## 2023-06-11 NOTE — Patient Instructions (Signed)
 Breast Self-Awareness Breast self-awareness is knowing how your breasts look and feel. You need to: Check your breasts on a regular basis. Tell your doctor about any changes. Become familiar with the look and feel of your breasts. This can help you catch a breast problem while it is still small and can be treated. You should do breast self-exams even if you have breast implants. What you need: A mirror. A well-lit room. A pillow or other soft object. How to do a breast self-exam Follow these steps to do a breast self-exam: Look for changes  Take off all the clothes above your waist. Stand in front of a mirror in a room with good lighting. Put your hands down at your sides. Compare your breasts in the mirror. Look for any difference between them, such as: A difference in shape. A difference in size. Wrinkles, dips, and bumps in one breast and not the other. Look at each breast for changes in the skin, such as: Redness. Scaly areas. Skin that has gotten thicker. Dimpling. Open sores (ulcers). Look for changes in your nipples, such as: Fluid coming out of a nipple. Fluid around a nipple. Bleeding. Dimpling. Redness. A nipple that looks pushed in (retracted), or that has changed position. Feel for changes Lie on your back. Feel each breast. To do this: Pick a breast to feel. Place a pillow under the shoulder closest to that breast. Put the arm closest to that breast behind your head. Feel the nipple area of that breast using the hand of your other arm. Feel the area with the pads of your three middle fingers by making small circles with your fingers. Use light, medium, and firm pressure. Continue the overlapping circles, moving downward over the breast. Keep making circles with your fingers. Stop when you feel your ribs. Start making circles with your fingers again, this time going upward until you reach your collarbone. Then, make circles outward across your breast and into your  armpit area. Squeeze your nipple. Check for discharge and lumps. Repeat these steps to check your other breast. Sit or stand in the tub or shower. With soapy water on your skin, feel each breast the same way you did when you were lying down. Write down what you find Writing down what you find can help you remember what to tell your doctor. Write down: What is normal for each breast. Any changes you find in each breast. These include: The kind of changes you find. A tender or painful breast. Any lump you find. Write down its size and where it is. When you last had your monthly period (menstrual cycle). General tips If you are breastfeeding, the best time to check your breasts is after you feed your baby or after you use a breast pump. If you get monthly bleeding, the best time to check your breasts is 5-7 days after your monthly cycle ends. With time, you will become comfortable with the self-exam. You will also start to know if there are changes in your breasts. Contact a doctor if: You see a change in the shape or size of your breasts or nipples. You see a change in the skin of your breast or nipples, such as red or scaly skin. You have fluid coming from your nipples that is not normal. You find a new lump or thick area. You have breast pain. You have any concerns about your breast health. Summary Breast self-awareness includes looking for changes in your breasts and feeling for changes  within your breasts. You should do breast self-awareness in front of a mirror in a well-lit room. If you get monthly periods (menstrual cycles), the best time to check your breasts is 5-7 days after your period ends. Tell your doctor about any changes you see in your breasts. Changes include changes in size, changes on the skin, painful or tender breasts, or fluid from your nipples that is not normal. This information is not intended to replace advice given to you by your health care provider. Make sure  you discuss any questions you have with your health care provider. Document Revised: 11/14/2021 Document Reviewed: 04/11/2021 Elsevier Patient Education  2024 [employer].

## 2023-08-11 ENCOUNTER — Ambulatory Visit
Payer: Managed Care, Other (non HMO) | Attending: Student in an Organized Health Care Education/Training Program | Admitting: Student in an Organized Health Care Education/Training Program

## 2023-08-11 ENCOUNTER — Encounter: Payer: Self-pay | Admitting: Student in an Organized Health Care Education/Training Program

## 2023-08-11 VITALS — BP 158/82 | HR 103 | Temp 96.8°F | Resp 16 | Ht 62.0 in | Wt 185.0 lb

## 2023-08-11 DIAGNOSIS — Z0289 Encounter for other administrative examinations: Secondary | ICD-10-CM | POA: Diagnosis present

## 2023-08-11 DIAGNOSIS — M5136 Other intervertebral disc degeneration, lumbar region with discogenic back pain only: Secondary | ICD-10-CM | POA: Insufficient documentation

## 2023-08-11 DIAGNOSIS — G894 Chronic pain syndrome: Secondary | ICD-10-CM | POA: Insufficient documentation

## 2023-08-11 DIAGNOSIS — Z79891 Long term (current) use of opiate analgesic: Secondary | ICD-10-CM | POA: Diagnosis not present

## 2023-08-11 DIAGNOSIS — M533 Sacrococcygeal disorders, not elsewhere classified: Secondary | ICD-10-CM | POA: Diagnosis present

## 2023-08-11 DIAGNOSIS — M51362 Other intervertebral disc degeneration, lumbar region with discogenic back pain and lower extremity pain: Secondary | ICD-10-CM | POA: Insufficient documentation

## 2023-08-11 MED ORDER — METHADONE HCL 5 MG PO TABS
5.0000 mg | ORAL_TABLET | Freq: Three times a day (TID) | ORAL | 0 refills | Status: DC
Start: 2023-10-19 — End: 2023-11-12

## 2023-08-11 MED ORDER — MELOXICAM 15 MG PO TABS
15.0000 mg | ORAL_TABLET | Freq: Every day | ORAL | 2 refills | Status: AC
Start: 2023-08-11 — End: 2023-11-09

## 2023-08-11 MED ORDER — METHADONE HCL 5 MG PO TABS
5.0000 mg | ORAL_TABLET | Freq: Three times a day (TID) | ORAL | 0 refills | Status: AC
Start: 2023-09-19 — End: 2023-10-19

## 2023-08-11 MED ORDER — METHADONE HCL 5 MG PO TABS
5.0000 mg | ORAL_TABLET | Freq: Three times a day (TID) | ORAL | 0 refills | Status: AC
Start: 2023-08-20 — End: 2023-09-19

## 2023-08-11 NOTE — Patient Instructions (Addendum)
Fill dates 08-20-23 09-19-23 10-19-23

## 2023-08-11 NOTE — Progress Notes (Signed)
Nursing Pain Medication Assessment:  Safety precautions to be maintained throughout the outpatient stay will include: orient to surroundings, keep bed in low position, maintain call bell within reach at all times, provide assistance with transfer out of bed and ambulation.  Medication Inspection Compliance: Pill count conducted under aseptic conditions, in front of the patient. Neither the pills nor the bottle was removed from the patient's sight at any time. Once count was completed pills were immediately returned to the patient in their original bottle.  Medication: Methadone Pill/Patch Count:  36 of 90 pills remain Pill/Patch Appearance: Markings consistent with prescribed medication Bottle Appearance: Standard pharmacy container. Clearly labeled. Filled Date: 1 / 38 / 2025 Last Medication intake:  Today

## 2023-08-11 NOTE — Progress Notes (Signed)
PROVIDER NOTE: Information contained herein reflects review and annotations entered in association with encounter. Interpretation of such information and data should be left to medically-trained personnel. Information provided to patient can be located elsewhere in the medical record under "Patient Instructions". Document created using STT-dictation technology, any transcriptional errors that may result from process are unintentional.    Patient: Natasha Gonzales  Service Category: E/M  Provider: Edward Jolly, MD  DOB: June 18, 1956  DOS: 08/11/2023  Specialty: Interventional Pain Management  MRN: [id number]  Setting: Ambulatory outpatient  PCP: Franciso Bend, NP  Type: Established Patient    Referring Provider: Franciso Bend, NP  Location: Office  Delivery: Face-to-face     HPI  Natasha Gonzales, a 68 y.o. 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 Pain Assessment: Severity of Chronic pain is reported as a 4 /10. Location: Other (Comment) (joint pain; hands, ankles, knees, hips)  /right leg to foot pain is exacerbated when arthritis flares. Onset: More than a month ago. Quality: Sharp. Timing: Constant. Modifying factor(s): methadone. Vitals:  height is 5\' 2"  (1.575 m) and weight is 185 lb (83.9 kg). Her temperature is 96.8 F (36 C) (abnormal). Her blood pressure is 158/82 (abnormal) and her pulse is 103 (abnormal). Her respiration is 16 and oxygen saturation is  98%.   Reason for encounter: medication management.    Patient continues multimodal pain regimen as prescribed.  States that it provides pain relief and improvement in functional status.    Pharmacotherapy Assessment  Analgesic: Methadone 5 mg TID prn #90/month, MME= 45   Monitoring: [place] PMP: PDMP reviewed during this encounter.       Pharmacotherapy: No side-effects or adverse reactions reported. Compliance: No problems identified. Effectiveness: Clinically acceptable.  UDS:  Summary  Date Value Ref Range Status  04/28/2023 Note  Final    Comment:    ==================================================================== [employer] Select 13 (MW) ==================================================================== Test                             Result       Flag       Units  Drug Present and Declared for Prescription Verification   Methadone                      1713         EXPECTED   ng/mg creat   EDDP (Methadone Mtb)           2342         EXPECTED   ng/mg creat    Sources of methadone include scheduled prescription medications.    EDDP is an expected metabolite of methadone.  ==================================================================== Test                      Result    Flag   Units      Ref Range   Creatinine              186  mg/dL      >=16 ==================================================================== Declared Medications:  The flagging and interpretation on this report are based on the  following declared medications.  Unexpected results may arise from  inaccuracies in the declared medications.   **Note: The testing scope of this panel includes these medications:   Methadone (Dolophine)   **Note: The testing scope of this panel does not include the  following reported medications:   Aspirin  Buspirone (Buspar)  Hydroxychloroquine (Plaquenil)  Ibuprofen (Advil)  Leflunomide (Arava)  Lisinopril (Zestril)  Magnesium  Methocarbamol  (Robaxin)  Montelukast (Singulair)  Multivitamin  Rosuvastatin (Crestor)  Sulfasalazine (Azulfidine)  Turmeric  Zinc ==================================================================== For clinical consultation, please call [phone]. ====================================================================       ROS  Constitutional: Denies any fever or chills Gastrointestinal: No reported hemesis, hematochezia, vomiting, or acute GI distress Musculoskeletal:  low back pain Neurological: No reported episodes of acute onset apraxia, aphasia, dysarthria, agnosia, amnesia, paralysis, loss of coordination, or loss of consciousness  Medication Review  Magnesium, Turmeric, Zinc, aspirin EC, busPIRone, hydroxychloroquine, ibuprofen, leflunomide, lisinopril, meloxicam, methadone, methocarbamol, montelukast, multivitamin, rosuvastatin, and sulfaSALAzine  History Review  Allergy: Natasha Gonzales is allergic to erythromycin base. Drug: Natasha Gonzales  reports no history of drug use. Alcohol:  reports that she does not currently use alcohol. Tobacco:  reports that she has been smoking cigarettes. She has been exposed to tobacco smoke. She has never used smokeless tobacco. Social: Natasha Gonzales  reports that she has been smoking cigarettes. She has been exposed to tobacco smoke. She has never used smokeless tobacco. She reports that she does not currently use alcohol. She reports that she does not use drugs. Medical:  has a past medical history of Anxiety, Complication of anesthesia, Depression, Generalized headaches, History of kidney stones, Hyperlipidemia, Hypertension, Ovarian cyst, Pneumonia, and Rheumatoid arthritis (HCC). Surgical: Natasha Gonzales  has a past surgical history that includes Tonsilectomy/adenoidectomy with myringotomy (1984); Abdominal hysterectomy (1993); adhesion (2001); Ovarian cyst removal; Breast biopsy (Left, 06/25/2022); Breast biopsy (Left, 06/25/2022); Breast biopsy (Left,  06/25/2022); Breast biopsy (Left, 06/25/2022); Breast biopsy (11/05/2022); Knee arthroscopy (Right); Colonoscopy w/ polypectomy; Breast biopsy with radio frequency localizer (Left, 11/26/2022); and Breast excisional biopsy (Left). Family: family history includes Cancer in her brother and another family member; Heart disease in her father and mother; Hypertension in her mother; Kidney disease in her mother.  Laboratory Chemistry Profile   Renal Lab Results  Component Value Date   BUN 21 11/19/2022   CREATININE 1.11 (H) 11/19/2022   GFRAA >60 01/24/2014   GFRNONAA 55 (L) 11/19/2022     Hepatic Lab Results  Component Value Date   AST 40 11/19/2022   ALT 38 11/19/2022   ALBUMIN 4.7 11/19/2022   ALKPHOS 63 11/19/2022   LIPASE 129 01/24/2014     Electrolytes Lab Results  Component Value Date   NA 140 11/19/2022   K 4.1 11/19/2022   CL 105 11/19/2022   CALCIUM 9.4 11/19/2022     Bone No results found for: "VD25OH", "VD125OH2TOT", "WJ1914NW2", "NF6213YQ6", "25OHVITD1", "25OHVITD2", "25OHVITD3", "TESTOFREE", "TESTOSTERONE"   Inflammation (CRP: Acute Phase) (ESR: Chronic Phase) No results found for: "CRP", "ESRSEDRATE", "LATICACIDVEN"     Note: Above Lab results reviewed.  Recent Imaging Review  MM 3D SCREENING MAMMOGRAM BILATERAL BREAST CLINICAL DATA:  Screening.  EXAM: DIGITAL SCREENING BILATERAL MAMMOGRAM WITH TOMOSYNTHESIS AND CAD  TECHNIQUE: Bilateral screening digital craniocaudal and mediolateral oblique mammograms were obtained. Bilateral screening digital breast tomosynthesis was performed. The images were evaluated with computer-aided detection.  COMPARISON:  Previous exam(s).  ACR Breast Density Category b: There are scattered areas of fibroglandular density.  FINDINGS: There are no findings suspicious for malignancy.  IMPRESSION: No mammographic evidence of malignancy. A result letter of this screening mammogram will be mailed directly to the  patient.  RECOMMENDATION: Screening mammogram in one year. (Code:SM-B-01Y)  BI-RADS CATEGORY  1: Negative.  Electronically Signed   By: Baird Lyons M.D.   On: 06/08/2023 12:54  Note: Reviewed        Physical Exam  General appearance: Well nourished, well developed, and well hydrated. In no apparent acute distress Mental status: Alert, oriented x 3 (person, place, & time)       Respiratory: No evidence of acute respiratory distress Eyes: PERLA Vitals: BP (!) 158/82   Pulse (!) 103   Temp (!) 96.8 F (36 C)   Resp 16   Ht 5\' 2"  (1.575 m)   Wt 185 lb (83.9 kg)   SpO2 98%   BMI 33.84 kg/m  BMI: Estimated body mass index is 33.84 kg/m as calculated from the following:   Height as of this encounter: 5\' 2"  (1.575 m).   Weight as of this encounter: 185 lb (83.9 kg). Ideal: Ideal body weight: 50.1 kg (110 lb 7.2 oz) Adjusted ideal body weight: 63.6 kg (140 lb 4.3 oz)  Lumbar spine: Skin & Axial Inspection: No masses, redness, or swelling Alignment: Symmetrical Functional ROM: Unrestricted ROM       Stability: No instability detected Muscle Tone/Strength: Functionally intact. No obvious neuro-muscular anomalies detected. Sensory (Neurological): Musculoskeletal pain pattern  Gait & Posture Assessment  Ambulation: Unassisted Gait: Relatively normal for age and body habitus Posture: WNL    Lower Extremity Exam      Side: Right lower extremity   Side: Left lower extremity  Stability: No instability observed           Stability: No instability observed          Skin & Extremity Inspection: Skin color, temperature, and hair growth are WNL. No peripheral edema or cyanosis. No masses, redness, swelling, asymmetry, or associated skin lesions. No contractures.   Skin & Extremity Inspection: Skin color, temperature, and hair growth are WNL. No peripheral edema or cyanosis. No masses, redness, swelling, asymmetry, or associated skin lesions. No contractures.  Functional ROM: pain  restricted           Functional ROM: Unrestricted ROM                  Muscle Tone/Strength: Functionally intact. No obvious neuro-muscular anomalies detected.   Muscle Tone/Strength: Functionally intact. No obvious neuro-muscular anomalies detected.  Sensory (Neurological): Musculoskeletal pain pattern, severe   Sensory (Neurological): Musculoskeletal pain pattern        DTR: Patellar: deferred today Achilles: deferred today Plantar: deferred today   DTR: Patellar: deferred today Achilles: deferred today Plantar: deferred today  Palpation: No palpable anomalies   Palpation: No palpable anomalies    Assessment   Status Diagnosis  Controlled Controlled Controlled 1. 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 Chronic pain syndrome   3. Degeneration of intervertebral disc of lumbar region with discogenic back pain   4. Pain management contract signed   5. Sacroiliac joint pain   6. Degeneration of intervertebral disc of lumbar region with discogenic back pain and lower extremity pain       Plan of Care   [name] has a current medication list which  includes the following long-term medication(s): lisinopril, montelukast, rosuvastatin, and sulfasalazine.  1. Chronic pain syndrome - methadone (DOLOPHINE) 5 MG tablet; Take 1 tablet (5 mg total) by mouth every 8 (eight) hours. Must last 30 days.  Dispense: 90 tablet; Refill: 0 - methadone (DOLOPHINE) 5 MG tablet; Take 1 tablet (5 mg total) by mouth every 8 (eight) hours. Must last 30 days.  Dispense: 90 tablet; Refill: 0 - methadone (DOLOPHINE) 5 MG tablet; Take 1 tablet (5 mg total) by mouth every 8 (eight) hours. Must last 30 days.  Dispense: 90 tablet; Refill: 0 - meloxicam (MOBIC) 15 MG tablet; Take 1 tablet (15 mg total) by mouth daily.  Dispense: 30 tablet; Refill: 2  2. 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 Degeneration of intervertebral disc of lumbar region with  discogenic back pain  4. Pain management contract signed  5. Sacroiliac joint pain  6. Degeneration of intervertebral disc of lumbar region with discogenic back pain and lower extremity pain    Pharmacotherapy (Medications Ordered): Meds ordered this encounter  Medications   methadone (DOLOPHINE) 5 MG tablet    Sig: Take 1 tablet (5 mg total) by mouth every 8 (eight) hours. Must last 30 days.    Dispense:  90 tablet    Refill:  0    [place] STOP ACT - Not applicable. Fill one day early if pharmacy is closed on scheduled refill date.   methadone (DOLOPHINE) 5 MG tablet    Sig: Take 1 tablet (5 mg total) by mouth every 8 (eight) hours. Must last 30 days.    Dispense:  90 tablet    Refill:  0    [place] STOP ACT - Not applicable. Fill one day early if pharmacy is closed on scheduled refill date.   methadone (DOLOPHINE) 5 MG tablet    Sig: Take 1 tablet (5 mg total) by mouth every 8 (eight) hours. Must last 30 days.    Dispense:  90 tablet    Refill:  0    [place] STOP ACT - Not applicable. Fill one day early if pharmacy is closed on scheduled refill date.   meloxicam (MOBIC) 15 MG tablet    Sig: Take 1 tablet (15 mg total) by mouth daily.    Dispense:  30 tablet    Refill:  2   No orders of the defined types were placed in this encounter.    Follow-up plan:   Return in about 3 months (around 11/08/2023) for MM, F2F.    Recent Visits No visits were found meeting these conditions. Showing recent visits within past 90 days and meeting all other requirements Today's Visits Date Type Provider Dept  08/11/23 Office Visit Edward Jolly, MD [facility]  Showing today's visits and meeting all other requirements Future Appointments No visits were found meeting these conditions. Showing future appointments within next 90 days and meeting all other requirements  I discussed the assessment and treatment plan with the patient. The patient was provided an opportunity to ask questions and  all were answered. The patient agreed with the plan and demonstrated an understanding of the instructions.  Patient advised to call back or seek an in-person evaluation if the symptoms or condition worsens.  Duration of encounter: 30 minutes.  Note by: Edward Jolly, MD Date: 08/11/2023; Time: 11:13 AM

## 2023-10-27 ENCOUNTER — Other Ambulatory Visit: Payer: Self-pay | Admitting: Student in an Organized Health Care Education/Training Program

## 2023-10-27 DIAGNOSIS — G894 Chronic pain syndrome: Secondary | ICD-10-CM

## 2023-11-05 ENCOUNTER — Encounter: Payer: Managed Care, Other (non HMO) | Admitting: Student in an Organized Health Care Education/Training Program

## 2023-11-12 ENCOUNTER — Ambulatory Visit
Attending: Student in an Organized Health Care Education/Training Program | Admitting: Student in an Organized Health Care Education/Training Program

## 2023-11-12 ENCOUNTER — Encounter: Payer: Self-pay | Admitting: Student in an Organized Health Care Education/Training Program

## 2023-11-12 VITALS — BP 132/64 | HR 84 | Temp 97.6°F | Ht 62.0 in | Wt 185.0 lb

## 2023-11-12 DIAGNOSIS — Z79891 Long term (current) use of opiate analgesic: Secondary | ICD-10-CM | POA: Insufficient documentation

## 2023-11-12 DIAGNOSIS — M533 Sacrococcygeal disorders, not elsewhere classified: Secondary | ICD-10-CM | POA: Diagnosis present

## 2023-11-12 DIAGNOSIS — M5136 Other intervertebral disc degeneration, lumbar region with discogenic back pain only: Secondary | ICD-10-CM | POA: Diagnosis present

## 2023-11-12 DIAGNOSIS — G894 Chronic pain syndrome: Secondary | ICD-10-CM | POA: Diagnosis present

## 2023-11-12 DIAGNOSIS — M51362 Other intervertebral disc degeneration, lumbar region with discogenic back pain and lower extremity pain: Secondary | ICD-10-CM | POA: Insufficient documentation

## 2023-11-12 DIAGNOSIS — Z0289 Encounter for other administrative examinations: Secondary | ICD-10-CM | POA: Diagnosis present

## 2023-11-12 MED ORDER — METHADONE HCL 5 MG PO TABS
5.0000 mg | ORAL_TABLET | Freq: Three times a day (TID) | ORAL | 0 refills | Status: AC
Start: 2023-12-19 — End: 2024-01-18

## 2023-11-12 MED ORDER — METHADONE HCL 5 MG PO TABS
5.0000 mg | ORAL_TABLET | Freq: Three times a day (TID) | ORAL | 0 refills | Status: DC
Start: 2024-01-18 — End: 2024-02-10

## 2023-11-12 MED ORDER — METHADONE HCL 5 MG PO TABS
5.0000 mg | ORAL_TABLET | Freq: Three times a day (TID) | ORAL | 0 refills | Status: AC
Start: 2023-11-19 — End: 2023-12-19

## 2023-11-12 NOTE — Progress Notes (Signed)
 PROVIDER NOTE: Information contained herein reflects review and annotations entered in association with encounter. Interpretation of such information and data should be left to medically-trained personnel. Information provided to patient can be located elsewhere in the medical record under "Patient Instructions". Document created using STT-dictation technology, any transcriptional errors that may result from process are unintentional.    Patient: Natasha Gonzales  Service Category: E/M  Provider: Cephus Collin, MD  DOB: 04/26/1956  DOS: 11/12/2023  Specialty: Interventional Pain Management  MRN: [id number]  Setting: Ambulatory outpatient  PCP: Meri Stammer, NP  Type: Established Patient    Referring Provider: Meri Stammer, NP  Location: Office  Delivery: Face-to-face     HPI  Natasha Gonzales, a 69 y.o. year old female, is here today because of her Chronic pain syndrome [G89.4]. Natasha Gonzales primary complain today is low back pain  Last encounter: My last encounter with her was on 04/28/23  Pertinent problems: Natasha Gonzales has Anxiety; Essential hypertension; Rheumatoid arthritis involving multiple sites with positive rheumatoid factor (HCC); Chronic pain syndrome; Lumbar degenerative disc disease; Sacroiliac joint pain; Encounter for long-term methadone  use for pain control; and Pain management contract signed on their pertinent problem list. Pain Assessment: Severity of Chronic pain is reported as a 3 /10. Location: Back Left, Right/pain radiaties down her right leg. Onset: More than a month ago. Quality: Sharp, Aching, Discomfort, Spasm, Constant. Timing: Constant. Modifying factor(s): Meds, ice and heat, repositioning. Vitals:  height is 5\' 2"  (1.575 m) and weight is 185 lb (83.9 kg). Her temperature is 97.6 F (36.4 C). Her blood pressure is 132/64 and her pulse is 84. Her oxygen saturation is 96%.   Reason for encounter: medication management.    Discussed the use of AI scribe software  for clinical note transcription with the patient, who gave verbal consent to proceed.  History of Present Illness   GERRE RANUM is a 68 year old female who presents for follow-up regarding her use of Enbrel and carpal tunnel syndrome management.  She has recently started taking Enbrel injections, initially experiencing improvement in her symptoms. However, her condition has since fluctuated, with periods of stabilization followed by worsening symptoms.  She has been experiencing carpal tunnel syndrome, primarily affecting her right hand. For the past six to seven weeks, she has been wearing a brace on her right hand, using a harder brace at night due to significant restriction and pain.     Patient continues multimodal pain regimen as prescribed.  States that it provides pain relief and improvement in functional status.   Pharmacotherapy Assessment  Analgesic: Methadone  5 mg TID prn #90/month, MME= 45   Monitoring: [place] PMP: PDMP reviewed during this encounter.       Pharmacotherapy: No side-effects or adverse reactions reported. Compliance: No problems identified. Effectiveness: Clinically acceptable.  UDS:  Summary  Date Value Ref Range Status  04/28/2023 Note  Final    Comment:    ==================================================================== [employer] Select 13 (MW) ==================================================================== Test                             Result       Flag       Units  Drug Present and Declared for Prescription Verification   Methadone                       1713         EXPECTED  ng/mg creat   EDDP (Methadone  Mtb)           2342         EXPECTED   ng/mg creat    Sources of methadone  include scheduled prescription medications.    EDDP is an expected metabolite of methadone .  ==================================================================== Test                      Result    Flag   Units      Ref Range   Creatinine              186               mg/dL      >=96 ==================================================================== Declared Medications:  The flagging and interpretation on this report are based on the  following declared medications.  Unexpected results may arise from  inaccuracies in the declared medications.   **Note: The testing scope of this panel includes these medications:   Methadone  (Dolophine )   **Note: The testing scope of this panel does not include the  following reported medications:   Aspirin  Buspirone (Buspar)  Hydroxychloroquine (Plaquenil)  Ibuprofen (Advil)  Leflunomide (Arava)  Lisinopril  (Zestril )  Magnesium  Methocarbamol  (Robaxin )  Montelukast (Singulair)  Multivitamin  Rosuvastatin  (Crestor )  Sulfasalazine (Azulfidine)  Turmeric  Zinc ==================================================================== For clinical consultation, please call [phone]. ====================================================================       ROS  Constitutional: Denies any fever or chills Gastrointestinal: No reported hemesis, hematochezia, vomiting, or acute GI distress Musculoskeletal: low back pain Neurological: No reported episodes of acute onset apraxia, aphasia, dysarthria, agnosia, amnesia, paralysis, loss of coordination, or loss of consciousness  Medication Review  Magnesium, Turmeric, Zinc, aspirin EC, busPIRone, etanercept, hydroxychloroquine, ibuprofen, leflunomide, lisinopril , methadone , methocarbamol , montelukast, multivitamin, rosuvastatin , and sulfaSALAzine  History Review  Allergy: Natasha Gonzales is allergic to erythromycin base. Drug: Natasha Gonzales  reports no history of drug use. Alcohol:  reports that she does not currently use alcohol. Tobacco:  reports that she has been smoking cigarettes. She has been exposed to tobacco smoke. She has never used smokeless tobacco. Social: Natasha Gonzales  reports that she has been smoking cigarettes. She has been exposed to tobacco  smoke. She has never used smokeless tobacco. She reports that she does not currently use alcohol. She reports that she does not use drugs. Medical:  has a past medical history of Anxiety, Complication of anesthesia, Depression, Generalized headaches, History of kidney stones, Hyperlipidemia, Hypertension, Ovarian cyst, Pneumonia, and Rheumatoid arthritis (HCC). Surgical: Natasha Gonzales  has a past surgical history that includes Tonsilectomy/adenoidectomy with myringotomy (1984); Abdominal hysterectomy (1993); adhesion (2001); Ovarian cyst removal; Breast biopsy (Left, 06/25/2022); Breast biopsy (Left, 06/25/2022); Breast biopsy (Left, 06/25/2022); Breast biopsy (Left, 06/25/2022); Breast biopsy (11/05/2022); Knee arthroscopy (Right); Colonoscopy w/ polypectomy; Breast biopsy with radio frequency localizer (Left, 11/26/2022); and Breast excisional biopsy (Left). Family: family history includes Cancer in her brother and another family member; Heart disease in her father and mother; Hypertension in her mother; Kidney disease in her mother.  Laboratory Chemistry Profile   Renal Lab Results  Component Value Date   BUN 21 11/19/2022   CREATININE 1.11 (H) 11/19/2022   GFRAA >60 01/24/2014   GFRNONAA 55 (L) 11/19/2022     Hepatic Lab Results  Component Value Date   AST 40 11/19/2022   ALT 38 11/19/2022   ALBUMIN 4.7 11/19/2022   ALKPHOS 63 11/19/2022   LIPASE 129 01/24/2014     Electrolytes Lab Results  Component Value Date   NA 140 11/19/2022   K 4.1 11/19/2022   CL 105 11/19/2022   CALCIUM  9.4 11/19/2022     Bone No results found for: "VD25OH", "VD125OH2TOT", "XB2841LK4", "MW1027OZ3", "25OHVITD1", "25OHVITD2", "25OHVITD3", "TESTOFREE", "TESTOSTERONE"   Inflammation (CRP: Acute Phase) (ESR: Chronic Phase) No results found for: "CRP", "ESRSEDRATE", "LATICACIDVEN"     Note: Above Lab results reviewed.  Recent Imaging Review  MM 3D SCREENING MAMMOGRAM BILATERAL BREAST CLINICAL DATA:   Screening.  EXAM: DIGITAL SCREENING BILATERAL MAMMOGRAM WITH TOMOSYNTHESIS AND CAD  TECHNIQUE: Bilateral screening digital craniocaudal and mediolateral oblique mammograms were obtained. Bilateral screening digital breast tomosynthesis was performed. The images were evaluated with computer-aided detection.  COMPARISON:  Previous exam(s).  ACR Breast Density Category b: There are scattered areas of fibroglandular density.  FINDINGS: There are no findings suspicious for malignancy.  IMPRESSION: No mammographic evidence of malignancy. A result letter of this screening mammogram will be mailed directly to the patient.  RECOMMENDATION: Screening mammogram in one year. (Code:SM-B-01Y)  BI-RADS CATEGORY  1: Negative.  Electronically Signed   By: Dina  Arceo M.D.   On: 06/08/2023 12:54  Note: Reviewed        Physical Exam  General appearance: Well nourished, well developed, and well hydrated. In no apparent acute distress Mental status: Alert, oriented x 3 (person, place, & time)       Respiratory: No evidence of acute respiratory distress Eyes: PERLA Vitals: BP 132/64   Pulse 84   Temp 97.6 F (36.4 C)   Ht 5\' 2"  (1.575 m)   Wt 185 lb (83.9 kg)   SpO2 96%   BMI 33.84 kg/m  BMI: Estimated body mass index is 33.84 kg/m as calculated from the following:   Height as of this encounter: 5\' 2"  (1.575 m).   Weight as of this encounter: 185 lb (83.9 kg). Ideal: Ideal body weight: 50.1 kg (110 lb 7.2 oz) Adjusted ideal body weight: 63.6 kg (140 lb 4.3 oz)  Lumbar spine: Skin & Axial Inspection: No masses, redness, or swelling Alignment: Symmetrical Functional ROM: Unrestricted ROM       Stability: No instability detected Muscle Tone/Strength: Functionally intact. No obvious neuro-muscular anomalies detected. Sensory (Neurological): Musculoskeletal pain pattern  Gait & Posture Assessment  Ambulation: Unassisted Gait: Relatively normal for age and body habitus Posture:  WNL    Lower Extremity Exam      Side: Right lower extremity   Side: Left lower extremity  Stability: No instability observed           Stability: No instability observed          Skin & Extremity Inspection: Skin color, temperature, and hair growth are WNL. No peripheral edema or cyanosis. No masses, redness, swelling, asymmetry, or associated skin lesions. No contractures.   Skin & Extremity Inspection: Skin color, temperature, and hair growth are WNL. No peripheral edema or cyanosis. No masses, redness, swelling, asymmetry, or associated skin lesions. No contractures.  Functional ROM: pain restricted           Functional ROM: Unrestricted ROM                  Muscle Tone/Strength: Functionally intact. No obvious neuro-muscular anomalies detected.   Muscle Tone/Strength: Functionally intact. No obvious neuro-muscular anomalies detected.  Sensory (Neurological): Musculoskeletal pain pattern, severe   Sensory (Neurological): Musculoskeletal pain pattern        DTR: Patellar: deferred today Achilles: deferred today Plantar: deferred today   DTR: Patellar: deferred  today Achilles: deferred today Plantar: deferred today  Palpation: No palpable anomalies   Palpation: No palpable anomalies    Assessment   Status Diagnosis  Controlled Controlled Controlled 1. Chronic pain syndrome   2. Encounter for long-term methadone  use for pain control   3. Degeneration of intervertebral disc of lumbar region with discogenic back pain   4. Pain management contract signed   5. Sacroiliac joint pain   6. Degeneration of intervertebral disc of lumbar region with discogenic back pain and lower extremity pain       Plan of Care   Natasha Gonzales has a current medication list which includes the following long-term medication(s): enbrel, lisinopril , montelukast, rosuvastatin , and sulfasalazine.  1. Chronic pain syndrome (Primary) - methadone  (DOLOPHINE ) 5 MG tablet; Take 1 tablet (5 mg total) by  mouth every 8 (eight) hours. Must last 30 days.  Dispense: 90 tablet; Refill: 0 - methadone  (DOLOPHINE ) 5 MG tablet; Take 1 tablet (5 mg total) by mouth every 8 (eight) hours. Must last 30 days.  Dispense: 90 tablet; Refill: 0 - methadone  (DOLOPHINE ) 5 MG tablet; Take 1 tablet (5 mg total) by mouth every 8 (eight) hours. Must last 30 days.  Dispense: 90 tablet; Refill: 0  2. Encounter for long-term methadone  use for pain control  3. Degeneration of intervertebral disc of lumbar region with discogenic back pain  4. Pain management contract signed  5. Sacroiliac joint pain  6. Degeneration of intervertebral disc of lumbar region with discogenic back pain and lower extremity pain    Pharmacotherapy (Medications Ordered): Meds ordered this encounter  Medications   methadone  (DOLOPHINE ) 5 MG tablet    Sig: Take 1 tablet (5 mg total) by mouth every 8 (eight) hours. Must last 30 days.    Dispense:  90 tablet    Refill:  0    [place] STOP ACT - Not applicable. Fill one day early if pharmacy is closed on scheduled refill date.   methadone  (DOLOPHINE ) 5 MG tablet    Sig: Take 1 tablet (5 mg total) by mouth every 8 (eight) hours. Must last 30 days.    Dispense:  90 tablet    Refill:  0    [place] STOP ACT - Not applicable. Fill one day early if pharmacy is closed on scheduled refill date.   methadone  (DOLOPHINE ) 5 MG tablet    Sig: Take 1 tablet (5 mg total) by mouth every 8 (eight) hours. Must last 30 days.    Dispense:  90 tablet    Refill:  0    [place] STOP ACT - Not applicable. Fill one day early if pharmacy is closed on scheduled refill date.   No orders of the defined types were placed in this encounter.    Follow-up plan:   Return in about 3 months (around 02/12/2024) for Marthe Slain, MM.    Recent Visits No visits were found meeting these conditions. Showing recent visits within past 90 days and meeting all other requirements Today's Visits Date Type Provider Dept  11/12/23 Office Visit  Cephus Collin, MD [facility]  Showing today's visits and meeting all other requirements Future Appointments No visits were found meeting these conditions. Showing future appointments within next 90 days and meeting all other requirements  I discussed the assessment and treatment plan with the patient. The patient was provided an opportunity to ask questions and all were answered. The patient agreed with the plan and demonstrated an understanding of the instructions.  Patient  advised to call back or seek an in-person evaluation if the symptoms or condition worsens.  Duration of encounter: 30 minutes.  Note by: Cephus Collin, MD Date: 11/12/2023; Time: 11:52 AM

## 2023-11-12 NOTE — Progress Notes (Signed)
 Nursing Pain Medication Assessment:  Safety precautions to be maintained throughout the outpatient stay will include: orient to surroundings, keep bed in low position, maintain call bell within reach at all times, provide assistance with transfer out of bed and ambulation.  Medication Inspection Compliance: Pill count conducted under aseptic conditions, in front of the patient. Neither the pills nor the bottle was removed from the patient's sight at any time. Once count was completed pills were immediately returned to the patient in their original bottle.  Medication: Methadone  Pill/Patch Count: 38 of 90 pills/patches remain Pill/Patch Appearance: Markings consistent with prescribed medication Bottle Appearance: Standard pharmacy container. Clearly labeled. Filled Date: 4 / 37 / 2025 Last Medication intake:  TodaySafety precautions to be maintained throughout the outpatient stay will include: orient to surroundings, keep bed in low position, maintain call bell within reach at all times, provide assistance with transfer out of bed and ambulation.

## 2024-02-11 ENCOUNTER — Ambulatory Visit: Attending: Nurse Practitioner | Admitting: Nurse Practitioner

## 2024-02-11 ENCOUNTER — Encounter: Payer: Self-pay | Admitting: Nurse Practitioner

## 2024-02-11 VITALS — BP 143/72 | HR 83 | Temp 96.8°F | Resp 18 | Ht 62.0 in | Wt 185.0 lb

## 2024-02-11 DIAGNOSIS — M533 Sacrococcygeal disorders, not elsewhere classified: Secondary | ICD-10-CM | POA: Diagnosis not present

## 2024-02-11 DIAGNOSIS — M51362 Other intervertebral disc degeneration, lumbar region with discogenic back pain and lower extremity pain: Secondary | ICD-10-CM | POA: Insufficient documentation

## 2024-02-11 DIAGNOSIS — M5136 Other intervertebral disc degeneration, lumbar region with discogenic back pain only: Secondary | ICD-10-CM | POA: Insufficient documentation

## 2024-02-11 DIAGNOSIS — Z79891 Long term (current) use of opiate analgesic: Secondary | ICD-10-CM | POA: Diagnosis present

## 2024-02-11 DIAGNOSIS — G894 Chronic pain syndrome: Secondary | ICD-10-CM | POA: Diagnosis present

## 2024-02-11 DIAGNOSIS — M1711 Unilateral primary osteoarthritis, right knee: Secondary | ICD-10-CM | POA: Insufficient documentation

## 2024-02-11 DIAGNOSIS — M069 Rheumatoid arthritis, unspecified: Secondary | ICD-10-CM | POA: Insufficient documentation

## 2024-02-11 MED ORDER — METHADONE HCL 5 MG PO TABS
5.0000 mg | ORAL_TABLET | Freq: Three times a day (TID) | ORAL | 0 refills | Status: DC
Start: 2024-04-25 — End: 2024-05-05

## 2024-02-11 MED ORDER — METHADONE HCL 5 MG PO TABS
5.0000 mg | ORAL_TABLET | Freq: Three times a day (TID) | ORAL | 0 refills | Status: AC
Start: 2024-03-26 — End: 2024-04-25

## 2024-02-11 MED ORDER — METHADONE HCL 5 MG PO TABS
5.0000 mg | ORAL_TABLET | Freq: Three times a day (TID) | ORAL | 0 refills | Status: AC
Start: 2024-02-25 — End: 2024-03-26

## 2024-02-11 NOTE — Progress Notes (Signed)
 Nursing Pain Medication Assessment:  Safety precautions to be maintained throughout the outpatient stay will include: orient to surroundings, keep bed in low position, maintain call bell within reach at all times, provide assistance with transfer out of bed and ambulation.  Medication Inspection Compliance: Pill count conducted under aseptic conditions, in front of the patient. Neither the pills nor the bottle was removed from the patient's sight at any time. Once count was completed pills were immediately returned to the patient in their original bottle.  Medication: Methadone  Pill/Patch Count: 73 of 90 pills/patches remain Pill/Patch Appearance: Markings consistent with prescribed medication Bottle Appearance: Standard pharmacy container. Clearly labeled. Filled Date: 08 / 05 / 2025 Last Medication intake:  TodaySafety precautions to be maintained throughout the outpatient stay will include: orient to surroundings, keep bed in low position, maintain call bell within reach at all times, provide assistance with transfer out of bed and ambulation.

## 2024-02-11 NOTE — Progress Notes (Signed)
 PROVIDER NOTE: Interpretation of information contained herein should be left to medically-trained personnel. Specific patient instructions are provided elsewhere under Patient Instructions section of medical record. This document was created in part using AI and STT-dictation technology, any transcriptional errors that may result from this process are unintentional.  Patient: Natasha Gonzales  Service: E/M   PCP: Sharl Delon NOVAK, NP  DOB: [birth year]  DOS: 02/11/2024  Provider: Emmy MARLA Blanch, NP  MRN: [id number]  Delivery: Face-to-face  Specialty: Interventional Pain Management  Type: Established Patient  Setting: Ambulatory outpatient facility  Specialty designation: 09  Referring Prov.: Sharl Delon NOVAK, NP  Location: Outpatient office facility       History of present illness (HPI) [name], a 68 y.o. year old female, is here today because of her Chronic pain syndrome [G89.4]. Ms. Mensch primary complain today is Back Pain (lower)  Pertinent problems: Ms. Ohmer has Anxiety; Essential hypertension; Rheumatoid arthritis involving multiple sites with positive rheumatoid factor (HCC); Lumbar degenerative disc disease; Sacroiliac joint pain; Encounter for long-term methadone  use for pain control; and Chronic pain syndrome on their pertinent problem list.   Pain Assessment: Severity of Chronic pain is reported as a 2 /10. Location: Back Lower/right leg to the foot. Onset: More than a month ago. Quality: Sharp, Other (Comment) (hot). Timing: Constant. Modifying factor(s): positioning, heat, ice. Vitals:  height is 5' 2 (1.575 m) and weight is 185 lb (83.9 kg). Her temporal temperature is 96.8 F (36 C) (abnormal). Her blood pressure is 143/72 (abnormal) and her pulse is 83. Her respiration is 18.  BMI: Estimated body mass index is 33.84 kg/m as calculated from the following:   Height as of this encounter: 5' 2 (1.575 m).   Weight as of this encounter: 185 lb (83.9 kg).  Last  encounter: 11/12/2023 Last procedure: Visit date not found.  Reason for encounter: medication management. No change in medical history since last visit.  Patient's pain is at baseline.  Patient continues multimodal pain regimen as prescribed.  States that it provides pain relief and improvement in functional status.   Pharmacotherapy Assessment   Methadone  (Dolophine ) 5 mg TID prn #90/month, MME= 45  Monitoring: [place] PMP: PDMP reviewed during this encounter.       Pharmacotherapy: No side-effects or adverse reactions reported. Compliance: No problems identified. Effectiveness: Clinically acceptable.  Shela Reda CROME, RN  02/11/2024 10:39 AM  Sign when Signing Visit Nursing Pain Medication Assessment:  Safety precautions to be maintained throughout the outpatient stay will include: orient to surroundings, keep bed in low position, maintain call bell within reach at all times, provide assistance with transfer out of bed and ambulation.  Medication Inspection Compliance: Pill count conducted under aseptic conditions, in front of the patient. Neither the pills nor the bottle was removed from the patient's sight at any time. Once count was completed pills were immediately returned to the patient in their original bottle.  Medication: Methadone  Pill/Patch Count: 73 of 90 pills/patches remain Pill/Patch Appearance: Markings consistent with prescribed medication Bottle Appearance: Standard pharmacy container. Clearly labeled. Filled Date: 08 / 05 / 2025 Last Medication intake:  TodaySafety precautions to be maintained throughout the outpatient stay will include: orient to surroundings, keep bed in low position, maintain call bell within reach at all times, provide assistance with transfer out of bed and ambulation.     UDS:  Summary  Date Value Ref Range Status  04/28/2023 Note  Final    Comment:    ====================================================================  [employer] Select 13  (MW) ==================================================================== Test                             Result       Flag       Units  Drug Present and Declared for Prescription Verification   Methadone                       1713         EXPECTED   ng/mg creat   EDDP (Methadone  Mtb)           2342         EXPECTED   ng/mg creat    Sources of methadone  include scheduled prescription medications.    EDDP is an expected metabolite of methadone .  ==================================================================== Test                      Result    Flag   Units      Ref Range   Creatinine              186              mg/dL      >=79 ==================================================================== Declared Medications:  The flagging and interpretation on this report are based on the  following declared medications.  Unexpected results may arise from  inaccuracies in the declared medications.   **Note: The testing scope of this panel includes these medications:   Methadone  (Dolophine )   **Note: The testing scope of this panel does not include the  following reported medications:   Aspirin  Buspirone (Buspar)  Hydroxychloroquine (Plaquenil)  Ibuprofen (Advil)  Leflunomide (Arava)  Lisinopril  (Zestril )  Magnesium  Methocarbamol  (Robaxin )  Montelukast (Singulair)  Multivitamin  Rosuvastatin  (Crestor )  Sulfasalazine (Azulfidine)  Turmeric  Zinc ==================================================================== For clinical consultation, please call [phone]. ====================================================================     No results found for: CBDTHCR No results found for: D8THCCBX No results found for: D9THCCBX  ROS  Constitutional: Denies any fever or chills Gastrointestinal: No reported hemesis, hematochezia, vomiting, or acute GI distress Musculoskeletal: Lower back pain radiates down to the feet (Bilateral) Neurological: No reported  episodes of acute onset apraxia, aphasia, dysarthria, agnosia, amnesia, paralysis, loss of coordination, or loss of consciousness  Medication Review  Evolocumab, Magnesium, Turmeric, Zinc, aspirin EC, busPIRone, etanercept, hydroxychloroquine, ibuprofen, leflunomide, lisinopril , methadone , methocarbamol , montelukast, multivitamin, oxybutynin, rosuvastatin , and sulfaSALAzine  History Review  Allergy: Ms. Pollitt is allergic to erythromycin base. Drug: Ms. Sando  reports no history of drug use. Alcohol:  reports that she does not currently use alcohol. Tobacco:  reports that she has been smoking cigarettes. She has been exposed to tobacco smoke. She has never used smokeless tobacco. Social: Ms. Losada  reports that she has been smoking cigarettes. She has been exposed to tobacco smoke. She has never used smokeless tobacco. She reports that she does not currently use alcohol. She reports that she does not use drugs. Medical:  has a past medical history of Anxiety, Complication of anesthesia, Depression, Generalized headaches, History of kidney stones, Hyperlipidemia, Hypertension, Ovarian cyst, Pneumonia, and Rheumatoid arthritis (HCC). Surgical: Ms. Stehr  has a past surgical history that includes Tonsilectomy/adenoidectomy with myringotomy (1984); Abdominal hysterectomy (1993); adhesion (2001); Ovarian cyst removal; Breast biopsy (Left, 06/25/2022); Breast biopsy (Left, 06/25/2022); Breast biopsy (Left, 06/25/2022); Breast biopsy (Left, 06/25/2022); Breast biopsy (11/05/2022); Knee arthroscopy (Right); Colonoscopy w/ polypectomy; Breast biopsy with radio  frequency localizer (Left, 11/26/2022); and Breast excisional biopsy (Left). Family: family history includes Cancer in her brother and another family member; Heart disease in her father and mother; Hypertension in her mother; Kidney disease in her mother.  Laboratory Chemistry Profile   Renal Lab Results  Component Value Date   BUN 21 11/19/2022    CREATININE 1.11 (H) 11/19/2022   GFRAA >60 01/24/2014   GFRNONAA 55 (L) 11/19/2022    Hepatic Lab Results  Component Value Date   AST 40 11/19/2022   ALT 38 11/19/2022   ALBUMIN 4.7 11/19/2022   ALKPHOS 63 11/19/2022   LIPASE 129 01/24/2014    Electrolytes Lab Results  Component Value Date   NA 140 11/19/2022   K 4.1 11/19/2022   CL 105 11/19/2022   CALCIUM  9.4 11/19/2022    Bone No results found for: VD25OH, VD125OH2TOT, CI6874NY7, CI7874NY7, 25OHVITD1, 25OHVITD2, 25OHVITD3, TESTOFREE, TESTOSTERONE  Inflammation (CRP: Acute Phase) (ESR: Chronic Phase) No results found for: CRP, ESRSEDRATE, LATICACIDVEN       Note: Above Lab results reviewed.  Recent Imaging Review  MM 3D SCREENING MAMMOGRAM BILATERAL BREAST CLINICAL DATA:  Screening.  EXAM: DIGITAL SCREENING BILATERAL MAMMOGRAM WITH TOMOSYNTHESIS AND CAD  TECHNIQUE: Bilateral screening digital craniocaudal and mediolateral oblique mammograms were obtained. Bilateral screening digital breast tomosynthesis was performed. The images were evaluated with computer-aided detection.  COMPARISON:  Previous exam(s).  ACR Breast Density Category b: There are scattered areas of fibroglandular density.  FINDINGS: There are no findings suspicious for malignancy.  IMPRESSION: No mammographic evidence of malignancy. A result letter of this screening mammogram will be mailed directly to the patient.  RECOMMENDATION: Screening mammogram in one year. (Code:SM-B-01Y)  BI-RADS CATEGORY  1: Negative.  Electronically Signed   By: Dina  Arceo M.D.   On: 06/08/2023 12:54 Note: Reviewed        Physical Exam  Vitals: BP (!) 143/72 (Cuff Size: Normal)   Pulse 83   Temp (!) 96.8 F (36 C) (Temporal)   Resp 18   Ht 5' 2 (1.575 m)   Wt 185 lb (83.9 kg)   BMI 33.84 kg/m  BMI: Estimated body mass index is 33.84 kg/m as calculated from the following:   Height as of this encounter: 5' 2 (1.575  m).   Weight as of this encounter: 185 lb (83.9 kg). Ideal: Ideal body weight: 50.1 kg (110 lb 7.2 oz) Adjusted ideal body weight: 63.6 kg (140 lb 4.3 oz) General appearance: Well nourished, well developed, and well hydrated. In no apparent acute distress Mental status: Alert, oriented x 3 (person, place, & time)       Respiratory: No evidence of acute respiratory distress Eyes: PERLA   Assessment   Diagnosis Status  1. Chronic pain syndrome   2. Encounter for long-term methadone  use for pain control   3. Degeneration of intervertebral disc of lumbar region with discogenic back pain   4. Sacroiliac joint pain   5. Degeneration of intervertebral disc of lumbar region with discogenic back pain and lower extremity pain   6. Rheumatoid arthritis involving multiple sites, unspecified whether rheumatoid factor present (HCC)   7. Primary osteoarthritis of right knee    Controlled Controlled Controlled   Updated Problems: No problems updated.  Plan of Care  Problem-specific:  Assessment and Plan  We will continue on current medication regimen.  Prescribing drug monitoring (PDMP) reviewed; findings consistent with the use of prescribed medication and no evidence of narcotic misuse or abuse.  No other new  issues or problems reported at this visit.  Urine drug screening (UDS) up-to-date.  Schedule follow-up in 90 days for medication management with Domonique Brouillard.  Ms. NAYLEA WIGINGTON has a current medication list which includes the following long-term medication(s): enbrel, lisinopril , montelukast, rosuvastatin , and sulfasalazine.  Pharmacotherapy (Medications Ordered): Meds ordered this encounter  Medications   methadone  (DOLOPHINE ) 5 MG tablet    Sig: Take 1 tablet (5 mg total) by mouth every 8 (eight) hours. Must last 30 days.    Dispense:  90 tablet    Refill:  0    [place] STOP ACT - Not applicable. Fill one day early if pharmacy is closed on scheduled refill date.   methadone  (DOLOPHINE ) 5 MG  tablet    Sig: Take 1 tablet (5 mg total) by mouth every 8 (eight) hours. Must last 30 days.    Dispense:  90 tablet    Refill:  0    [place] STOP ACT - Not applicable. Fill one day early if pharmacy is closed on scheduled refill date.   methadone  (DOLOPHINE ) 5 MG tablet    Sig: Take 1 tablet (5 mg total) by mouth every 8 (eight) hours. Must last 30 days.    Dispense:  90 tablet    Refill:  0    [place] STOP ACT - Not applicable. Fill one day early if pharmacy is closed on scheduled refill date.   Orders:  No orders of the defined types were placed in this encounter.       Return in about 3 months (around 05/13/2024) for (F2F), (MM), Emmy Blanch NP.    Recent Visits No visits were found meeting these conditions. Showing recent visits within past 90 days and meeting all other requirements Today's Visits Date Type Provider Dept  02/11/24 Office Visit Amiel Sharrow K, NP [facility]  Showing today's visits and meeting all other requirements Future Appointments Date Type Provider Dept  05/05/24 Appointment Shadman Tozzi K, NP [facility]  Showing future appointments within next 90 days and meeting all other requirements  I discussed the assessment and treatment plan with the patient. The patient was provided an opportunity to ask questions and all were answered. The patient agreed with the plan and demonstrated an understanding of the instructions.  Patient advised to call back or seek an in-person evaluation if the symptoms or condition worsens.  Duration of encounter: 30 minutes.  Total time on encounter, as per AMA guidelines included both the face-to-face and non-face-to-face time personally spent by the physician and/or other qualified health care professional(s) on the day of the encounter (includes time in activities that require the physician or other qualified health care professional and does not include time in activities normally performed by clinical staff).  Physician's time may include the following activities when performed: Preparing to see the patient (e.g., pre-charting review of records, searching for previously ordered imaging, lab work, and nerve conduction tests) Review of prior analgesic pharmacotherapies. Reviewing PMP Interpreting ordered tests (e.g., lab work, imaging, nerve conduction tests) Performing post-procedure evaluations, including interpretation of diagnostic procedures Obtaining and/or reviewing separately obtained history Performing a medically appropriate examination and/or evaluation Counseling and educating the patient/family/caregiver Ordering medications, tests, or procedures Referring and communicating with other health care professionals (when not separately reported) Documenting clinical information in the electronic or other health record Independently interpreting results (not separately reported) and communicating results to the patient/ family/caregiver Care coordination (not separately reported)  Note by: Mata Rowen K Faylinn Schwenn, NP (TTS and  AI technology used. I apologize for any typographical errors that were not detected and corrected.) Date: 02/11/2024; Time: 11:03 AM

## 2024-05-05 ENCOUNTER — Ambulatory Visit: Attending: Nurse Practitioner | Admitting: Nurse Practitioner

## 2024-05-05 ENCOUNTER — Encounter: Payer: Self-pay | Admitting: Nurse Practitioner

## 2024-05-05 VITALS — BP 149/92 | HR 88 | Temp 96.8°F | Resp 18 | Ht 62.0 in | Wt 190.0 lb

## 2024-05-05 DIAGNOSIS — M25561 Pain in right knee: Secondary | ICD-10-CM | POA: Insufficient documentation

## 2024-05-05 DIAGNOSIS — Z79899 Other long term (current) drug therapy: Secondary | ICD-10-CM | POA: Diagnosis present

## 2024-05-05 DIAGNOSIS — M1711 Unilateral primary osteoarthritis, right knee: Secondary | ICD-10-CM | POA: Diagnosis present

## 2024-05-05 DIAGNOSIS — Z79891 Long term (current) use of opiate analgesic: Secondary | ICD-10-CM | POA: Insufficient documentation

## 2024-05-05 DIAGNOSIS — M069 Rheumatoid arthritis, unspecified: Secondary | ICD-10-CM | POA: Insufficient documentation

## 2024-05-05 DIAGNOSIS — G8929 Other chronic pain: Secondary | ICD-10-CM | POA: Diagnosis present

## 2024-05-05 DIAGNOSIS — G894 Chronic pain syndrome: Secondary | ICD-10-CM | POA: Diagnosis present

## 2024-05-05 DIAGNOSIS — M533 Sacrococcygeal disorders, not elsewhere classified: Secondary | ICD-10-CM | POA: Insufficient documentation

## 2024-05-05 MED ORDER — METHOCARBAMOL 500 MG PO TABS: 500.0000 mg | ORAL_TABLET | Freq: Two times a day (BID) | ORAL | 2 refills | Status: AC | PRN

## 2024-05-05 MED ORDER — METHADONE HCL 5 MG PO TABS
5.0000 mg | ORAL_TABLET | Freq: Three times a day (TID) | ORAL | 0 refills | Status: AC
Start: 2024-07-23 — End: 2024-08-22

## 2024-05-05 MED ORDER — METHADONE HCL 5 MG PO TABS
5.0000 mg | ORAL_TABLET | Freq: Three times a day (TID) | ORAL | 0 refills | Status: AC
Start: 2024-06-23 — End: 2024-07-23

## 2024-05-05 MED ORDER — METHADONE HCL 5 MG PO TABS
5.0000 mg | ORAL_TABLET | Freq: Three times a day (TID) | ORAL | 0 refills | Status: AC
Start: 2024-05-24 — End: 2024-06-23

## 2024-05-05 MED ORDER — NALOXONE HCL 4 MG/0.1ML NA LIQD: 1.0000 | NASAL | 1 refills | Status: AC | PRN

## 2024-05-05 NOTE — Progress Notes (Signed)
 PROVIDER NOTE: Interpretation of information contained herein should be left to medically-trained personnel. Specific patient instructions are provided elsewhere under Patient Instructions section of medical record. This document was created in part using AI and STT-dictation technology, any transcriptional errors that may result from this process are unintentional.  Patient: Natasha Gonzales  Service: E/M   PCP: Sharl Delon NOVAK, NP  DOB: 08-28-1955  DOS: 05/05/2024  Provider: Emmy MARLA Blanch, NP  MRN: [id number]  Delivery: Face-to-face  Specialty: Interventional Pain Management  Type: Established Patient  Setting: Ambulatory outpatient facility  Specialty designation: 09  Referring Prov.: Sharl Delon NOVAK, NP  Location: Outpatient office facility       History of present illness (HPI) [name], a 68 y.o. year old female, is here today because of her Sacroiliac joint pain [M53.3]. [name] primary complain today is Back Pain  Pertinent problems: [name] has Anxiety; Essential hypertension; Rheumatoid arthritis involving multiple sites with positive rheumatoid factor (HCC); Lumbar degenerative disc disease; Sacroiliac joint pain; Encounter for long-term methadone  use for pain control; and Chronic pain syndrome on their pertinent problem list.   Pain Assessment: Severity of Chronic pain is reported as a 3 /10. Location: Back Right/Arond to right hip and down right leg into foot. Onset: More than a month ago. Quality: Sharp. Timing: Constant. Modifying factor(s): Changing positions, ice, heat. Vitals:  height is 5' 2 (1.575 m) and weight is 190 lb (86.2 kg). Her temporal temperature is 96.8 F (36 C) (abnormal). Her blood pressure is 149/92 (abnormal) and her pulse is 88. Her respiration is 18 and oxygen saturation is 94%.  BMI: Estimated body mass index is 34.75 kg/m as calculated from the following:   Height as of this encounter: 5' 2 (1.575 m).   Weight as of this encounter:  190 lb (86.2 kg).  Last encounter: 02/11/2024. Last procedure: Visit date not found.  Reason for encounter: medication management. No change in medical history since last visit.  Patient's pain is at baseline.  Patient continues multimodal pain regimen as prescribed.  States that it provides pain relief and improvement in functional status.   Discussed the use of AI scribe software for clinical note transcription with the patient, who gave verbal consent to proceed.  History of Present Illness   Natasha Gonzales is a 68 year old female with rheumatoid arthritis who presents with pain management concerns.  She experiences two types of pain, primarily starting in one area and radiating down her right leg, with significant intensity in her knee. This pain is severe enough to cause difficulty and instability in walking and mobility.  Her rheumatoid arthritis was not initially detected by rheumatoid factor tests, but approximately ten years ago, her rheumatoid factor levels increased significantly. She is under the care of a rheumatologist and undergoes regular monitoring, including CRP, rheumatoid titer, and sed rate tests. She is on Enbrel injections, which have improved her quality of life, but she continues to experience breakthrough symptoms. She also takes Plaquenil, Arava, and occasionally methocarbamol  for muscle tension associated with nerve and joint pain.  She has been on methadone  for pain management for a long time, having switched from oxycodone . She reports no side effects from methadone , except for initial constipation, which she manages with dietary adjustments and over-the-counter stool softeners. She does not take methocarbamol  daily.     Pharmacotherapy Assessment   Methadone  (Dolophine ) 5 mg TID prn #90/month, MME= 45  Methocarbamol  (Robaxin ) 500 mg tablet 2 times daily  as needed for muscle spasm Monitoring: [place] PMP: PDMP reviewed during this encounter.       Pharmacotherapy: No  side-effects or adverse reactions reported. Compliance: No problems identified. Effectiveness: Clinically acceptable.  Natasha Gonzales, [place]  05/05/2024 11:35 AM  Sign when Signing Visit Nursing Pain Medication Assessment:  Safety precautions to be maintained throughout the outpatient stay will include: orient to surroundings, keep bed in low position, maintain call bell within reach at all times, provide assistance with transfer out of bed and ambulation.  Medication Inspection Compliance: Pill count conducted under aseptic conditions, in front of the patient. Neither the pills nor the bottle was removed from the patient's sight at any time. Once count was completed pills were immediately returned to the patient in their original bottle.  Medication: Methadone  Pill/Patch Count: 30 of 90 pills/patches remain Pill/Patch Appearance: Markings consistent with prescribed medication Bottle Appearance: Standard pharmacy container. Clearly labeled. Filled Date: 54 / 21 / 2025 Last Medication intake:  Today    UDS:  Summary  Date Value Ref Range Status  04/28/2023 Note  Final    Comment:    ==================================================================== [employer] Select 13 (MW) ==================================================================== Test                             Result       Flag       Units  Drug Present and Declared for Prescription Verification   Methadone                       1713         EXPECTED   ng/mg creat   EDDP (Methadone  Mtb)           2342         EXPECTED   ng/mg creat    Sources of methadone  include scheduled prescription medications.    EDDP is an expected metabolite of methadone .  ==================================================================== Test                      Result    Flag   Units      Ref Range   Creatinine              186              mg/dL      >=79 ==================================================================== Declared  Medications:  The flagging and interpretation on this report are based on the  following declared medications.  Unexpected results may arise from  inaccuracies in the declared medications.   **Note: The testing scope of this panel includes these medications:   Methadone  (Dolophine )   **Note: The testing scope of this panel does not include the  following reported medications:   Aspirin  Buspirone (Buspar)  Hydroxychloroquine (Plaquenil)  Ibuprofen (Advil)  Leflunomide (Arava)  Lisinopril  (Zestril )  Magnesium  Methocarbamol  (Robaxin )  Montelukast (Singulair)  Multivitamin  Rosuvastatin  (Crestor )  Sulfasalazine (Azulfidine)  Turmeric  Zinc ==================================================================== For clinical consultation, please call [phone]. ====================================================================     No results found for: CBDTHCR No results found for: D8THCCBX No results found for: D9THCCBX  ROS  Constitutional: Denies any fever or chills Gastrointestinal: No reported hemesis, hematochezia, vomiting, or acute GI distress Musculoskeletal: low back pain radiate to right leg Neurological: No reported episodes of acute onset apraxia, aphasia, dysarthria, agnosia, amnesia, paralysis, loss of coordination, or loss of consciousness  Medication Review  Evolocumab, Magnesium, Turmeric,  Zinc, aspirin EC, busPIRone, etanercept, hydroxychloroquine, ibuprofen, leflunomide, lisinopril , methadone , methocarbamol , montelukast, multivitamin, oxybutynin, rosuvastatin , and sulfaSALAzine  History Review  Allergy: Ms. Obey is allergic to erythromycin base. Drug: Ms. Kempe  reports no history of drug use. Alcohol:  reports that she does not currently use alcohol. Tobacco:  reports that she has been smoking cigarettes. She has been exposed to tobacco smoke. She has never used smokeless tobacco. Social: Ms. Lafavor  reports that she has been smoking  cigarettes. She has been exposed to tobacco smoke. She has never used smokeless tobacco. She reports that she does not currently use alcohol. She reports that she does not use drugs. Medical:  has a past medical history of Anxiety, Complication of anesthesia, Depression, Generalized headaches, History of kidney stones, Hyperlipidemia, Hypertension, Ovarian cyst, Pneumonia, and Rheumatoid arthritis (HCC). Surgical: Ms. Bracy  has a past surgical history that includes Tonsilectomy/adenoidectomy with myringotomy (1984); Abdominal hysterectomy (1993); adhesion (2001); Ovarian cyst removal; Breast biopsy (Left, 06/25/2022); Breast biopsy (Left, 06/25/2022); Breast biopsy (Left, 06/25/2022); Breast biopsy (Left, 06/25/2022); Breast biopsy (11/05/2022); Knee arthroscopy (Right); Colonoscopy w/ polypectomy; Breast biopsy with radio frequency localizer (Left, 11/26/2022); and Breast excisional biopsy (Left). Family: family history includes Cancer in her brother and another family member; Heart disease in her father and mother; Hypertension in her mother; Kidney disease in her mother.  Laboratory Chemistry Profile   Renal Lab Results  Component Value Date   BUN 21 11/19/2022   CREATININE 1.11 (H) 11/19/2022   GFRAA >60 01/24/2014   GFRNONAA 55 (L) 11/19/2022    Hepatic Lab Results  Component Value Date   AST 40 11/19/2022   ALT 38 11/19/2022   ALBUMIN 4.7 11/19/2022   ALKPHOS 63 11/19/2022   LIPASE 129 01/24/2014    Electrolytes Lab Results  Component Value Date   NA 140 11/19/2022   K 4.1 11/19/2022   CL 105 11/19/2022   CALCIUM  9.4 11/19/2022    Bone No results found for: VD25OH, VD125OH2TOT, CI6874NY7, CI7874NY7, 25OHVITD1, 25OHVITD2, 25OHVITD3, TESTOFREE, TESTOSTERONE  Inflammation (CRP: Acute Phase) (ESR: Chronic Phase) No results found for: CRP, ESRSEDRATE, LATICACIDVEN       Note: Above Lab results reviewed.  Recent Imaging Review  MM 3D SCREENING  MAMMOGRAM BILATERAL BREAST CLINICAL DATA:  Screening.  EXAM: DIGITAL SCREENING BILATERAL MAMMOGRAM WITH TOMOSYNTHESIS AND CAD  TECHNIQUE: Bilateral screening digital craniocaudal and mediolateral oblique mammograms were obtained. Bilateral screening digital breast tomosynthesis was performed. The images were evaluated with computer-aided detection.  COMPARISON:  Previous exam(s).  ACR Breast Density Category b: There are scattered areas of fibroglandular density.  FINDINGS: There are no findings suspicious for malignancy.  IMPRESSION: No mammographic evidence of malignancy. A result letter of this screening mammogram will be mailed directly to the patient.  RECOMMENDATION: Screening mammogram in one year. (Code:SM-B-01Y)  BI-RADS CATEGORY  1: Negative.  Electronically Signed   By: Dina  Arceo M.D.   On: 06/08/2023 12:54 Note: Reviewed        Physical Exam  Vitals: BP (!) 149/92 (BP Location: Right Arm, Patient Position: Sitting)   Pulse 88   Temp (!) 96.8 F (36 C) (Temporal)   Resp 18   Ht 5' 2 (1.575 m)   Wt 190 lb (86.2 kg)   SpO2 94%   BMI 34.75 kg/m  BMI: Estimated body mass index is 34.75 kg/m as calculated from the following:   Height as of this encounter: 5' 2 (1.575 m).   Weight as of this encounter: 190 lb (86.2  kg). Ideal: Ideal body weight: 50.1 kg (110 lb 7.2 oz) Adjusted ideal body weight: 64.5 kg (142 lb 4.3 oz) General appearance: Well nourished, well developed, and well hydrated. In no apparent acute distress Mental status: Alert, oriented x 3 (person, place, & time)       Respiratory: No evidence of acute respiratory distress Eyes: PERLA  Musculoskeletal: +LBP radiate down to right leg Assessment   Diagnosis Status  1. Sacroiliac joint pain   2. Chronic pain syndrome   3. Medication management   4. Encounter for long-term methadone  use for pain control   5. Rheumatoid arthritis involving multiple sites, unspecified whether rheumatoid  factor present (HCC)   6. Chronic pain of right knee   7. Primary osteoarthritis of right knee    Controlled Controlled Controlled   Updated Problems: No problems updated.  Plan of Care  Problem-specific:  Assessment and Plan    Chronic pain syndrome Significant pain affecting mobility and stability, exacerbated by rheumatoid arthritis and osteoarthritis. - Prescribed methocarbamol  for muscle tension on bad days. Contributes to pain and instability with radiating pain.  Rheumatoid arthritis with breakthrough pain Managed with Enbrel, Plaquenil, and Arava. Breakthrough pain persists. Under evaluation for newer biologics in January. - Continue Enbrel injections, Plaquenil, and Arava. Contributes to pain and instability, exacerbated by rheumatoid arthritis.  Opioid therapy management Long-term methadone  therapy for pain management. No current side effects. - Continue methadone  therapy. - Advised on managing constipation with increased water  intake, fiber, and over-the-counter stool softeners like Miralax or Colace.  Patient's pain is controlled with methadone , will continue on current medication regimen.  Prescribing drug monitoring (PDMP) reviewed; findings consistent with the use of prescribed medication and no evidence of narcotic misuse or abuse. Routine UDS ordered today. Advised on managing constipation with increased water  intake, fiber, and over-the-counter stool softeners like Miralax or Colace.  Pain contract signed. schedule follow-up in 90 days for medication management.      Ms. TANEESHA EDGIN has a current medication list which includes the following long-term medication(s): enbrel, lisinopril , montelukast, rosuvastatin , and sulfasalazine.  Pharmacotherapy (Medications Ordered): Meds ordered this encounter  Medications   methadone  (DOLOPHINE ) 5 MG tablet    Sig: Take 1 tablet (5 mg total) by mouth every 8 (eight) hours. Must last 30 days.    Dispense:  90 tablet     Refill:  0    [place] STOP ACT - Not applicable. Fill one day early if pharmacy is closed on scheduled refill date.   methadone  (DOLOPHINE ) 5 MG tablet    Sig: Take 1 tablet (5 mg total) by mouth every 8 (eight) hours. Must last 30 days.    Dispense:  90 tablet    Refill:  0    [place] STOP ACT - Not applicable. Fill one day early if pharmacy is closed on scheduled refill date.   methadone  (DOLOPHINE ) 5 MG tablet    Sig: Take 1 tablet (5 mg total) by mouth every 8 (eight) hours. Must last 30 days.    Dispense:  90 tablet    Refill:  0    [place] STOP ACT - Not applicable. Fill one day early if pharmacy is closed on scheduled refill date.   methocarbamol  (ROBAXIN ) 500 MG tablet    Sig: Take 1 tablet (500 mg total) by mouth 2 (two) times daily as needed for muscle spasms.    Dispense:  60 tablet    Refill:  2   Orders:  Orders Placed This Encounter  Procedures   [employer] Select 13 (MW), Urine    Volume: 30 ml(s). Minimum 3 ml of urine is needed. Document temperature of fresh sample. Indications: Long term (current) use of opiate analgesic (S20.108)    Release to patient:   Immediate        Return in about 3 months (around 08/05/2024) for (F2F), (MM), Emmy Blanch NP.    Recent Visits Date Type Provider Dept  02/11/24 Office Visit Alessandro Griep K, NP [facility]  Showing recent visits within past 90 days and meeting all other requirements Today's Visits Date Type Provider Dept  05/05/24 Office Visit Brodie Correll K, NP [facility]  Showing today's visits and meeting all other requirements Future Appointments Date Type Provider Dept  08/02/24 Appointment Reagen Haberman K, NP [facility]  Showing future appointments within next 90 days and meeting all other requirements  I discussed the assessment and treatment plan with the patient. The patient was provided an opportunity to ask questions and all were answered. The patient agreed with the plan and demonstrated an  understanding of the instructions.  Patient advised to call back or seek an in-person evaluation if the symptoms or condition worsens.  I personally spent a total of 30 minutes in the care of the patient today including preparing to see the patient, getting/reviewing separately obtained history, performing a medically appropriate exam/evaluation, counseling and educating, placing orders, referring and communicating with other health care professionals, documenting clinical information in the EHR, independently interpreting results, communicating results, and coordinating care.   Note by: Jaegar Croft K Ndea Kilroy, NP (TTS and AI technology used. I apologize for any typographical errors that were not detected and corrected.) Date: 05/05/2024; Time: 11:39 AM

## 2024-05-05 NOTE — Progress Notes (Signed)
 Nursing Pain Medication Assessment:  Safety precautions to be maintained throughout the outpatient stay will include: orient to surroundings, keep bed in low position, maintain call bell within reach at all times, provide assistance with transfer out of bed and ambulation.  Medication Inspection Compliance: Pill count conducted under aseptic conditions, in front of the patient. Neither the pills nor the bottle was removed from the patient's sight at any time. Once count was completed pills were immediately returned to the patient in their original bottle.  Medication: Methadone  Pill/Patch Count: 30 of 90 pills/patches remain Pill/Patch Appearance: Markings consistent with prescribed medication Bottle Appearance: Standard pharmacy container. Clearly labeled. Filled Date: 65 / 21 / 2025 Last Medication intake:  Today

## 2024-05-10 ENCOUNTER — Other Ambulatory Visit: Payer: Self-pay | Admitting: Family Medicine

## 2024-05-10 DIAGNOSIS — Z1231 Encounter for screening mammogram for malignant neoplasm of breast: Secondary | ICD-10-CM

## 2024-05-13 LAB — TOXASSURE SELECT 13 (MW), URINE

## 2024-06-07 ENCOUNTER — Encounter

## 2024-06-07 ENCOUNTER — Inpatient Hospital Stay: Admission: RE | Admit: 2024-06-07 | Discharge: 2024-06-07 | Attending: Family Medicine

## 2024-06-07 DIAGNOSIS — Z1231 Encounter for screening mammogram for malignant neoplasm of breast: Secondary | ICD-10-CM | POA: Insufficient documentation

## 2024-08-02 ENCOUNTER — Encounter: Admitting: Nurse Practitioner
